# Patient Record
Sex: Male | Born: 1945
Health system: Southern US, Community
[De-identification: ages and names within clinical notes are randomized; demographics above are authoritative.]

## PROBLEM LIST (undated history)

## (undated) DIAGNOSIS — Z950 Presence of cardiac pacemaker: Secondary | ICD-10-CM

## (undated) DIAGNOSIS — I3139 Other pericardial effusion (noninflammatory): Secondary | ICD-10-CM

## (undated) DIAGNOSIS — I351 Nonrheumatic aortic (valve) insufficiency: Secondary | ICD-10-CM

## (undated) DIAGNOSIS — G4730 Sleep apnea, unspecified: Secondary | ICD-10-CM

## (undated) DIAGNOSIS — Z95810 Presence of automatic (implantable) cardiac defibrillator: Secondary | ICD-10-CM

## (undated) DIAGNOSIS — I313 Pericardial effusion (noninflammatory): Secondary | ICD-10-CM

## (undated) DIAGNOSIS — I420 Dilated cardiomyopathy: Secondary | ICD-10-CM

## (undated) DIAGNOSIS — I5020 Unspecified systolic (congestive) heart failure: Secondary | ICD-10-CM

## (undated) DIAGNOSIS — I071 Rheumatic tricuspid insufficiency: Secondary | ICD-10-CM

## (undated) DIAGNOSIS — I371 Nonrheumatic pulmonary valve insufficiency: Secondary | ICD-10-CM

## (undated) DIAGNOSIS — R5383 Other fatigue: Secondary | ICD-10-CM

## (undated) DIAGNOSIS — R0602 Shortness of breath: Secondary | ICD-10-CM

## (undated) DIAGNOSIS — I517 Cardiomegaly: Secondary | ICD-10-CM

## (undated) DIAGNOSIS — I447 Left bundle-branch block, unspecified: Secondary | ICD-10-CM

## (undated) DIAGNOSIS — I340 Nonrheumatic mitral (valve) insufficiency: Secondary | ICD-10-CM

## (undated) DIAGNOSIS — I509 Heart failure, unspecified: Secondary | ICD-10-CM

## (undated) HISTORY — DX: Nonrheumatic aortic (valve) insufficiency: I35.1

## (undated) HISTORY — DX: Cardiomegaly: I51.7

## (undated) HISTORY — DX: Other fatigue: R53.83

## (undated) HISTORY — DX: Left bundle-branch block, unspecified: I44.7

## (undated) HISTORY — DX: Rheumatic tricuspid insufficiency: I07.1

## (undated) HISTORY — DX: Other pericardial effusion (noninflammatory): I31.39

## (undated) HISTORY — DX: Shortness of breath: R06.02

## (undated) HISTORY — DX: Dilated cardiomyopathy: I42.0

## (undated) HISTORY — DX: Pericardial effusion (noninflammatory): I31.3

## (undated) HISTORY — DX: Nonrheumatic mitral (valve) insufficiency: I34.0

## (undated) HISTORY — DX: Nonrheumatic pulmonary valve insufficiency: I37.1

---

## 1998-01-01 HISTORY — PX: LEG SURGERY: SHX1003

## 2000-02-25 ENCOUNTER — Encounter: Payer: Self-pay | Admitting: Orthopedic Surgery

## 2000-02-25 ENCOUNTER — Emergency Department (HOSPITAL_COMMUNITY): Admission: EM | Admit: 2000-02-25 | Discharge: 2000-02-25 | Payer: Self-pay | Admitting: Emergency Medicine

## 2000-02-28 ENCOUNTER — Ambulatory Visit (HOSPITAL_BASED_OUTPATIENT_CLINIC_OR_DEPARTMENT_OTHER): Admission: RE | Admit: 2000-02-28 | Discharge: 2000-02-28 | Payer: Self-pay | Admitting: Orthopedic Surgery

## 2000-03-30 ENCOUNTER — Emergency Department (HOSPITAL_COMMUNITY): Admission: EM | Admit: 2000-03-30 | Discharge: 2000-03-30 | Payer: Self-pay | Admitting: Emergency Medicine

## 2015-03-21 ENCOUNTER — Encounter (HOSPITAL_COMMUNITY): Payer: Self-pay | Admitting: *Deleted

## 2015-03-21 ENCOUNTER — Emergency Department (HOSPITAL_COMMUNITY)
Admission: EM | Admit: 2015-03-21 | Discharge: 2015-03-21 | Disposition: A | Discharge status: Home / Self Care | Payer: Self-pay | Attending: Emergency Medicine | Admitting: Emergency Medicine

## 2015-03-21 DIAGNOSIS — I5020 Unspecified systolic (congestive) heart failure: Secondary | ICD-10-CM | POA: Insufficient documentation

## 2015-03-21 DIAGNOSIS — K6289 Other specified diseases of anus and rectum: Secondary | ICD-10-CM | POA: Insufficient documentation

## 2015-03-21 HISTORY — DX: Unspecified systolic (congestive) heart failure: I50.20

## 2015-03-21 NOTE — ED Notes (Signed)
Pt states this morning he noticed a bulge coming out of his rectum. States he was unsure if his rectum prolapsed or if he has hemorrhoids. Denies history of either in the past.

## 2015-03-21 NOTE — ED Notes (Signed)
Pt's stated that he didn't want to wait any longer Informed pt that due to his acuity he was next to go back. Pt asked if he was going to see and MD or a PA I told the pt i was unsure of that. Pt stated that he didn't want to wait any longer.

## 2016-04-03 DIAGNOSIS — I420 Dilated cardiomyopathy: Secondary | ICD-10-CM | POA: Diagnosis not present

## 2016-04-03 DIAGNOSIS — I5020 Unspecified systolic (congestive) heart failure: Secondary | ICD-10-CM | POA: Diagnosis not present

## 2016-05-29 DIAGNOSIS — L255 Unspecified contact dermatitis due to plants, except food: Secondary | ICD-10-CM | POA: Diagnosis not present

## 2016-06-19 DIAGNOSIS — J029 Acute pharyngitis, unspecified: Secondary | ICD-10-CM | POA: Diagnosis not present

## 2016-08-07 DIAGNOSIS — I447 Left bundle-branch block, unspecified: Secondary | ICD-10-CM | POA: Diagnosis not present

## 2016-08-07 DIAGNOSIS — I5020 Unspecified systolic (congestive) heart failure: Secondary | ICD-10-CM | POA: Diagnosis not present

## 2016-12-31 DIAGNOSIS — I420 Dilated cardiomyopathy: Secondary | ICD-10-CM | POA: Diagnosis not present

## 2016-12-31 DIAGNOSIS — Z0189 Encounter for other specified special examinations: Secondary | ICD-10-CM | POA: Diagnosis not present

## 2016-12-31 DIAGNOSIS — I5020 Unspecified systolic (congestive) heart failure: Secondary | ICD-10-CM | POA: Diagnosis not present

## 2017-03-14 DIAGNOSIS — J069 Acute upper respiratory infection, unspecified: Secondary | ICD-10-CM | POA: Diagnosis not present

## 2017-03-14 DIAGNOSIS — B9789 Other viral agents as the cause of diseases classified elsewhere: Secondary | ICD-10-CM | POA: Diagnosis not present

## 2017-04-30 DIAGNOSIS — I5020 Unspecified systolic (congestive) heart failure: Secondary | ICD-10-CM | POA: Diagnosis not present

## 2017-04-30 DIAGNOSIS — Z0189 Encounter for other specified special examinations: Secondary | ICD-10-CM | POA: Diagnosis not present

## 2017-04-30 DIAGNOSIS — I420 Dilated cardiomyopathy: Secondary | ICD-10-CM | POA: Diagnosis not present

## 2017-05-28 DIAGNOSIS — I5020 Unspecified systolic (congestive) heart failure: Secondary | ICD-10-CM | POA: Diagnosis not present

## 2017-06-25 DIAGNOSIS — R0602 Shortness of breath: Secondary | ICD-10-CM | POA: Diagnosis not present

## 2017-08-08 DIAGNOSIS — I5020 Unspecified systolic (congestive) heart failure: Secondary | ICD-10-CM | POA: Diagnosis not present

## 2017-08-15 DIAGNOSIS — I5020 Unspecified systolic (congestive) heart failure: Secondary | ICD-10-CM | POA: Diagnosis not present

## 2017-09-23 DIAGNOSIS — I420 Dilated cardiomyopathy: Secondary | ICD-10-CM | POA: Diagnosis not present

## 2017-09-23 DIAGNOSIS — Z0189 Encounter for other specified special examinations: Secondary | ICD-10-CM | POA: Diagnosis not present

## 2017-09-23 DIAGNOSIS — I5020 Unspecified systolic (congestive) heart failure: Secondary | ICD-10-CM | POA: Diagnosis not present

## 2017-10-11 ENCOUNTER — Encounter: Payer: Self-pay | Admitting: Internal Medicine

## 2017-10-14 ENCOUNTER — Ambulatory Visit (INDEPENDENT_AMBULATORY_CARE_PROVIDER_SITE_OTHER): Payer: Medicare Other | Admitting: Internal Medicine

## 2017-10-14 ENCOUNTER — Encounter: Payer: Self-pay | Admitting: Internal Medicine

## 2017-10-14 VITALS — BP 120/82 | HR 63 | Ht 72.0 in | Wt 188.4 lb

## 2017-10-14 DIAGNOSIS — I428 Other cardiomyopathies: Secondary | ICD-10-CM

## 2017-10-14 DIAGNOSIS — R931 Abnormal findings on diagnostic imaging of heart and coronary circulation: Secondary | ICD-10-CM | POA: Diagnosis not present

## 2017-10-14 NOTE — Progress Notes (Addendum)
ELECTROPHYSIOLOGY CONSULT NOTE  Patient ID: DANIELLE LENTO, MRN: [id number], DOB/AGE: 1945/01/25 72 y.o. Admit date: (Not on file) Date of Consult: 10/14/2017  Primary Physician: Patient, No Pcp Per Primary Cardiologist: CV     Brooke C Bakos is a 72 y.o. male who is being seen today for the evaluation of ICD at the request of [place].    HPI TRENELL CONCANNON is a 72 y.o. male referred for consideration of an ICD  He has a nonischemic cardiomyopathy which was diagnosed 2016 when he presented with orthopnea and recumbent cough.  His EF was 11% with anterior wall thinning.  It was presumed nonischemic.  With introduction of medical therapy he has no functional limitations although he did notice himself short of breath 2 weeks ago at work having climb 4 flights of stairs.  He walks/runs 4 miles a day.  He has no dyspnea with his normal activities including running.  No peripheral edema no palpitations no syncope.  He was told he had PVCs in the mid 20s.  His brother has a "enlarged heart"  DATE TEST EF   8/16 MYOVIEW   11 %   PVCs noted  8/18 Echo   13 % LAE severe MR Mod  8/19 Echo  13%        Past Medical History:  Diagnosis Date  . Cardiomegaly   . DCM (dilated cardiomyopathy) ([place])   . Fatigue   . LBBB (left bundle branch block)   . Mild tricuspid regurgitation   . Moderate aortic regurgitation   . Moderate mitral regurgitation   . Pericardial effusion    INSIGNIFICANT  . Short of breath on exertion   . Systolic heart failure ([place])   . Trace pulmonic regurgitation by prior echocardiogram       Surgical History:  Past Surgical History:  Procedure Laterality Date  . LEG SURGERY Left 2000   DUE TO BROKE LEG     Home Meds: Prior to Admission medications   Medication Sig Start Date End Date Taking? Authorizing Provider  carvedilol (COREG) 3.125 MG tablet Take 3.125 mg by mouth 2 (two) times daily with a meal.   Yes [provider]  furosemide (LASIX) 20 MG tablet Take 20 mg by mouth every other day.   Yes [provider]  sacubitril-valsartan (ENTRESTO) 97-103 MG Take 0.5 tablets by mouth 2 (two) times daily.   Yes [provider]    Allergies:  Allergies  Allergen Reactions  . Aspirin     Stomach pain     Social History   Socioeconomic History  . Marital status: Divorced    Spouse name: Not on file  . Number of children: 3  . Years of education: Not on file  . Highest education level: Not on file  Occupational History  . Not on file  Social Needs  . Financial resource strain: Not on file  . Food insecurity:    Worry: Not on file    Inability: Not on file  . Transportation needs:    Medical: Not on file    Non-medical: Not on file  Tobacco Use  . Smoking status: Never Smoker  . Smokeless tobacco: Never Used  Substance and Sexual Activity  . Alcohol use: No  . Drug use: No  . Sexual activity: Not on file  Lifestyle  . Physical activity:    Days per week: Not on file    Minutes per session: Not on file  .  Stress: Not on file  Relationships  . Social connections:    Talks on phone: Not on file    Gets together: Not on file    Attends religious service: Not on file    Active member of club or organization: Not on file    Attends meetings of clubs or organizations: Not on file    Relationship status: Not on file  . Intimate partner violence:    Fear of current or ex partner: Not on file    Emotionally abused: Not on file    Physically abused: Not on file    Forced sexual activity: Not on file  Other Topics Concern  . Not on file  Social History Narrative  . Not on file     Family History  Problem Relation Age of Onset  . Hypertension Mother 48  . Thyroid disease Mother      ROS:  Please see the history of present illness.     All other systems reviewed and negative.    Physical Exam: Blood pressure 120/82, pulse 63, height 6' (1.829 m), weight 188 lb 6.4  oz (85.5 kg), SpO2 99 %. General: Well developed, well nourished male in no acute distress. Head: Normocephalic, atraumatic, sclera non-icteric, no xanthomas, nares are without discharge. EENT: normal  Lymph Nodes:  none Neck: Negative for carotid bruits. JVD not elevated. Back:without scoliosis kyphosis Lungs: Clear bilaterally to auscultation without wheezes, rales, or rhonchi. Breathing is unlabored. Heart: RRR with S1 S2.  2/6 systolic murmur . No rubs, or gallops appreciated.  PMI is displaced and dyskinetic Abdomen: Soft, non-tender, non-distended with normoactive bowel sounds. No hepatomegaly. No rebound/guarding. No obvious abdominal masses. Msk:  Strength and tone appear normal for age. Extremities: No clubbing or cyanosis. No edema.  Distal pedal pulses are 2+ and equal bilaterally. Skin: Warm and Dry Neuro: Alert and oriented X 3. CN III-XII intact Grossly normal sensory and motor function . Psych:  Responds to questions appropriately with a normal affect.      Labs: Cardiac Enzymes No results for input(s): CKTOTAL, CKMB, TROPONINI in the last 72 hours. CBC No results found for: WBC, HGB, HCT, MCV, PLT PROTIME: No results for input(s): LABPROT, INR in the last 72 hours. Chemistry No results for input(s): NA, K, CL, CO2, BUN, CREATININE, CALCIUM, PROT, BILITOT, ALKPHOS, ALT, AST, GLUCOSE in the last 168 hours.  Invalid input(s): LABALBU Lipids No results found for: CHOL, HDL, LDLCALC, TRIG BNP No results found for: PROBNP Thyroid Function Tests: No results for input(s): TSH, T4TOTAL, T3FREE, THYROIDAB in the last 72 hours.  Invalid input(s): FREET3 Miscellaneous No results found for: DDIMER  Radiology/Studies:  No results found.  EKG: Sinus rhythm at 63 17/14/47 Axis left -25 IVCD PVCs-frequent  Rhythm strip demonstrates ventricular couplets and ventricular ectopy frequency   Assessment and Plan:  Cardiomyopathy-presumed nonischemic  Congestive heart  failure-class I-II  Ventricular ectopy-frequent and complex  IVCD-not left bundle  Hypokalemia with Entresto-high dose   His functional status i.e. estimated class I with running 4 miles daily, excludes him from current criteria for nonischemic cardiomyopathy ICD implantation for primary prevention.  Moreover, based on the [place] trial, ICD benefit would expected to be considerably less in this cohort than a younger cohort.  Hence, I think ICD implantation is not indicated.  His tendency towards hyperkalemia makes the use of spironolactone potentially treacherous.  Up titration of his carvedilol has also been limited by bradycardia.  His family history is intriguing.  He is  1 of 17 children.  His next closest brother also has "enlarged heart".  There are at least 2 deaths that occurred as accidents, one falling off the porch and the other in a car.  This raises the possibility of a familial cardiomyopathy.  His origins in [place] raise the question of [place] disease.  I discussed this with Dr. CV.  He will undertake cardiac MRI to try to elucidate mechanisms including the possibility of ischemic heart disease which might benefit from revascularization  The patient will be talking with his sister, a physician in [place], about the family history.  I discussed with the patient and his son the potential implications of genetic disease  He also has a history of long-standing ventricular ectopy.  On tracings today, PVCs constituted about 25% of his beats raising the possibility as to whether they are contributing to his cardiomyopathy, even if the primary mechanism of his cardiomyopathy is separate.  His PVCs are long-standing.  A recent paper highlights the long-term trajectory of people with long-standing PVCs as including cardiomyopathy.  In the event that his MRI shows significant scarring, studies have suggested that PVC obliteration either with drugs or ablation is not likely to be associated LV  function improvement  More than 50% of 85 min was spent in counseling related to the above   Virl Axe

## 2017-10-14 NOTE — Patient Instructions (Signed)
Medication Instructions:   Your physician recommends that you continue on your current medications as directed. Please refer to the Current Medication list given to you today.  If you need a refill on your cardiac medications before your next appointment, please call your pharmacy.   Lab work: None ordered. If you have labs (blood work) drawn today and your tests are completely normal, you will receive your results only by: Marland Kitchen MyChart Message (if you have MyChart) OR . A paper copy in the mail If you have any lab test that is abnormal or we need to change your treatment, we will call you to review the results.  Testing/Procedures: None ordered.  Follow-Up: At [facility], you and your health needs are our priority.   You may see Dr Caryl Comes as needed.  Any Other Special Instructions Will Be Listed Below (If Applicable).

## 2017-11-06 ENCOUNTER — Other Ambulatory Visit: Payer: Self-pay | Admitting: *Deleted

## 2017-11-06 DIAGNOSIS — I509 Heart failure, unspecified: Secondary | ICD-10-CM

## 2017-11-06 DIAGNOSIS — I420 Dilated cardiomyopathy: Secondary | ICD-10-CM

## 2017-11-08 ENCOUNTER — Encounter: Payer: Self-pay | Admitting: *Deleted

## 2017-11-08 ENCOUNTER — Telehealth: Payer: Self-pay | Admitting: *Deleted

## 2017-11-08 NOTE — Telephone Encounter (Signed)
Left message regarding Cardiac MRI ordered by DrMarland Kitchen Einar Gip.   Wednesday 11/20/17 at 8:00am----will mail instructions and appointment information to patient.  Also notified Dr. Irven Shelling office.

## 2017-11-20 ENCOUNTER — Other Ambulatory Visit: Payer: Self-pay | Admitting: *Deleted

## 2017-11-20 ENCOUNTER — Other Ambulatory Visit: Payer: Self-pay | Admitting: Cardiology

## 2017-11-20 ENCOUNTER — Ambulatory Visit (HOSPITAL_COMMUNITY)
Admission: RE | Admit: 2017-11-20 | Discharge: 2017-11-20 | Disposition: A | Discharge status: Home / Self Care | Payer: Medicare Other | Source: Ambulatory Visit | Attending: Cardiology | Admitting: Cardiology

## 2017-11-20 DIAGNOSIS — I420 Dilated cardiomyopathy: Secondary | ICD-10-CM | POA: Diagnosis not present

## 2017-11-20 DIAGNOSIS — I509 Heart failure, unspecified: Secondary | ICD-10-CM

## 2017-11-20 DIAGNOSIS — I083 Combined rheumatic disorders of mitral, aortic and tricuspid valves: Secondary | ICD-10-CM | POA: Insufficient documentation

## 2017-11-20 DIAGNOSIS — I313 Pericardial effusion (noninflammatory): Secondary | ICD-10-CM | POA: Insufficient documentation

## 2017-11-20 LAB — CREATININE, SERUM
Creatinine, Ser: 1.27 mg/dL — ABNORMAL HIGH (ref 0.61–1.24)
GFR calc Af Amer: >60 mL/min (ref >60)
GFR calc non Af Amer: 55 mL/min — ABNORMAL LOW (ref >60)

## 2017-11-20 MED ORDER — GADOBUTROL 1 MMOL/ML IV SOLN
8.5000 mL | Freq: Once | INTRAVENOUS | Status: AC | PRN
  Administered 2017-11-20: 8.5 mL via INTRAVENOUS

## 2018-01-25 DIAGNOSIS — I509 Heart failure, unspecified: Secondary | ICD-10-CM | POA: Diagnosis not present

## 2018-01-25 DIAGNOSIS — R5383 Other fatigue: Secondary | ICD-10-CM | POA: Diagnosis not present

## 2018-01-25 DIAGNOSIS — R0609 Other forms of dyspnea: Secondary | ICD-10-CM | POA: Diagnosis not present

## 2018-01-25 DIAGNOSIS — I420 Dilated cardiomyopathy: Secondary | ICD-10-CM | POA: Diagnosis not present

## 2018-01-27 DIAGNOSIS — I447 Left bundle-branch block, unspecified: Secondary | ICD-10-CM | POA: Diagnosis not present

## 2018-01-30 DIAGNOSIS — I5041 Acute combined systolic (congestive) and diastolic (congestive) heart failure: Secondary | ICD-10-CM | POA: Diagnosis not present

## 2018-01-30 DIAGNOSIS — Z0189 Encounter for other specified special examinations: Secondary | ICD-10-CM | POA: Diagnosis not present

## 2018-01-30 DIAGNOSIS — I428 Other cardiomyopathies: Secondary | ICD-10-CM | POA: Diagnosis not present

## 2018-01-30 DIAGNOSIS — I420 Dilated cardiomyopathy: Secondary | ICD-10-CM | POA: Diagnosis not present

## 2018-01-31 ENCOUNTER — Ambulatory Visit (INDEPENDENT_AMBULATORY_CARE_PROVIDER_SITE_OTHER): Payer: Medicare Other | Admitting: Internal Medicine

## 2018-01-31 ENCOUNTER — Encounter: Payer: Self-pay | Admitting: Internal Medicine

## 2018-01-31 VITALS — BP 112/76 | HR 64 | Ht 72.0 in | Wt 194.2 lb

## 2018-01-31 DIAGNOSIS — I509 Heart failure, unspecified: Secondary | ICD-10-CM | POA: Diagnosis not present

## 2018-01-31 DIAGNOSIS — I420 Dilated cardiomyopathy: Secondary | ICD-10-CM | POA: Diagnosis not present

## 2018-01-31 DIAGNOSIS — I428 Other cardiomyopathies: Secondary | ICD-10-CM | POA: Diagnosis not present

## 2018-01-31 DIAGNOSIS — R931 Abnormal findings on diagnostic imaging of heart and coronary circulation: Secondary | ICD-10-CM

## 2018-01-31 NOTE — Progress Notes (Signed)
Patient Care Team: [name], MD as PCP - General (Family Medicine)   HPI  Miguel Patterson is a 73 y.o. male He has a nonischemic cardiomyopathy which was diagnosed 2016 when he presented with orthopnea and recumbent cough.  His EF was 11% with anterior wall thinning.  It was presumed nonischemic.  With introduction of medical therapy he has no functional limitations although he did notice himself short of breath 2 weeks ago at work having climb 4 flights of stairs.  He walks/runs 4 miles a day.Marland Kitchen  He was told he had PVCs in the mid 20s.  His brother has a "enlarged heart"  DATE TEST EF   8/16 MYOVIEW   11 %   PVCs noted  8/18 Echo   13 % LAE severe MR Mod  8/19 Echo  13%   cMRI cMRI 14%  gadolinium enhancement basal septal non-compacted/compacted ratio 4: 1   He has developed intercurrently heart failure and has been treated with increased Lasix with some improved diuresis.   Records and Results Reviewed   Past Medical History:  Diagnosis Date  . Cardiomegaly   . DCM (dilated cardiomyopathy) ([place])   . Fatigue   . LBBB (left bundle branch block)   . Mild tricuspid regurgitation   . Moderate aortic regurgitation   . Moderate mitral regurgitation   . Pericardial effusion    INSIGNIFICANT  . Short of breath on exertion   . Systolic heart failure ([place])   . Trace pulmonic regurgitation by prior echocardiogram     Past Surgical History:  Procedure Laterality Date  . LEG SURGERY Left 2000   DUE TO BROKE LEG    Current Meds  Medication Sig  . carvedilol (COREG) 3.125 MG tablet Take 3.125 mg by mouth 2 (two) times daily with a meal.  . furosemide (LASIX) 20 MG tablet Take 20 mg by mouth 2 (two) times daily.   . sacubitril-valsartan (ENTRESTO) 97-103 MG Take 0.5 tablets by mouth 2 (two) times daily.    Allergies  Allergen Reactions  . Aspirin     Stomach pain       Review of Systems negative except from HPI and PMH  Physical Exam BP  112/76   Pulse 64   Ht 6' (1.829 m)   Wt 194 lb 3.2 oz (88.1 kg)   SpO2 97%   BMI 26.34 kg/m  Well developed and well nourished in no acute distress HENT normal E scleral and icterus clear Neck Supple JVP 8-10; carotids brisk and full Clear to ausculation Regular rate and rhythm, no murmurs gallops or rub Soft with active bowel sounds No clubbing cyanosis  Edema Alert and oriented, grossly normal motor and sensory function Skin Warm and Dry  ECG from yesterday demonstrates sinus rhythm with left bundle branch block  Assessment and  Plan Cardiomyopathy-presumed nonischemic possibly non-compaction  Congestive heart failure-class 2-3 Acute chronic  Ventricular ectopy-frequent and complex  Left bundle branch block  Hypokalemia with Entresto-high dose   The patient has developed acute on chronic congestive heart failure.  He remains volume overloaded.  He is being diuresed by his primary cardiology service.  He is referred back for consideration of an ICD in the context of his heart failure.  We had a lengthy discussion regarding modes of dying.  He is initially inclined towards no ICD as he would prefer sudden death in the present to some years of intervening life with alternative modes of dying at that  time.  I mentioned the fact that we live in the context of community and family.  He will review his decision with his children and get back to [place].  With his LBBB he may benefit from CRT   I will also review on his behalf the issue of non-compaction and familial evaluation  I wonder also whether he would benefit from spironolactone and will discuss this with Dr. Helyn Numbers  More than 50% of 40 min was spent in counseling related to the above     Current medicines are reviewed at length with the patient today .  The patient does not  have concerns regarding medicines.

## 2018-01-31 NOTE — Patient Instructions (Signed)
Medication Instructions:  Your physician recommends that you continue on your current medications as directed. Please refer to the Current Medication list given to you today.  Labwork: None ordered.  Testing/Procedures: None ordered.  Follow-Up: Your physician recommends that you schedule a follow-up as needed with Dr Caryl Comes.   Please call us when you would like to schedule your implant.   Any Other Special Instructions Will Be Listed Below (If Applicable).     If you need a refill on your cardiac medications before your next appointment, please call your pharmacy.

## 2018-02-06 ENCOUNTER — Other Ambulatory Visit: Payer: Self-pay | Admitting: Cardiology

## 2018-02-06 DIAGNOSIS — I428 Other cardiomyopathies: Secondary | ICD-10-CM | POA: Diagnosis not present

## 2018-02-07 LAB — BASIC METABOLIC PANEL
BUN / CREAT RATIO: 18 (ref 10–24)
BUN: 27 mg/dL (ref 8–27)
CHLORIDE: 104 mmol/L (ref 96–106)
CO2: 25 mmol/L (ref 20–29)
Calcium: 9.3 mg/dL (ref 8.6–10.2)
Creatinine, Ser: 1.52 mg/dL — ABNORMAL HIGH (ref 0.76–1.27)
GFR calc non Af Amer: 45 mL/min/{1.73_m2} — ABNORMAL LOW (ref >59)
GFR, EST AFRICAN AMERICAN: 52 mL/min/{1.73_m2} — AB (ref >59)
GLUCOSE: 104 mg/dL — AB (ref 65–99)
Potassium: 4.6 mmol/L (ref 3.5–5.2)
Sodium: 144 mmol/L (ref 134–144)

## 2018-02-07 LAB — BRAIN NATRIURETIC PEPTIDE: BNP: 1073.6 pg/mL — AB (ref 0.0–100.0)

## 2018-02-07 NOTE — Patient Instructions (Signed)
Heart Failure Exacerbation  Heart failure is a condition in which the heart does not fill up with enough blood, and therefore does not pump enough blood and oxygen to the body. When this happens, parts of the body do not get the blood and oxygen they need to function properly. This can cause symptoms such as breathing problems, fatigue, swelling, and confusion. Heart failure exacerbation refers to heart failure symptoms that get worse. The symptoms may get worse suddenly or develop slowly over time. Heart failure exacerbation is a serious medical problem that should be treated right away. What are the causes? A heart failure exacerbation can be triggered by:  Not taking your heart failure medicines correctly.  Infections.  Eating an unhealthy diet or a diet that is high in salt (sodium).  Drinking too much fluid.  Drinking alcohol.  Taking illegal drugs, such as cocaine or methamphetamine.  Not exercising. Other causes include:  Other heart conditions such as an irregular heartbeat (arrhythmia).  Anemia.  Other medical problems, such as kidney failure. Sometimes the cause of the exacerbation is not known. What are the signs or symptoms? When heart failure symptoms suddenly or slowly get worse, this may be a sign of heart failure exacerbation. Symptoms of heart failure include:  Breathing problems or shortness of breath.  Chronic coughing or wheezing.  Fatigue.  Nausea or lack of appetite.  Feeling light-headed.  Confusion or memory loss.  Increased heart rate or irregular heartbeat.  Buildup of fluid in the legs, ankles, feet, or abdomen.  Difficulty breathing when lying down. How is this diagnosed? This condition is diagnosed based on:  Your symptoms and medical history.  A physical exam. You may also have tests, including:  Electrocardiogram (ECG). This test measures the electrical activity of your heart.  Echocardiogram. This test uses sound waves to take a  picture of your heart to see how well it works.  Blood tests.  Imaging tests, such as: ? Chest X-ray. ? MRI. ? Ultrasound.  Stress test. This test examines how well your heart functions when you exercise. Your heart is monitored while you exercise on a treadmill or exercise bike. If you cannot exercise, medicines may be used to increase your heartbeat in place of exercise.  Cardiac catheterization. During this test, a thin, flexible tube (catheter) is inserted into a blood vessel and threaded up to your heart. This test allows your health care provider to check the arteries that lead to your heart (coronary arteries).  Right heart catheterization. During this test, the pressure in your heart is measured. How is this treated? This condition may be treated by:  Adjusting your heart medicines.  Maintaining a healthy lifestyle. This includes: ? Eating a heart-healthy diet that is low in sodium. ? Not using any products that contain nicotine or tobacco, such as cigarettes and e-cigarettes. ? Regular exercise. ? Monitoring your fluid intake. ? Monitoring your weight and reporting changes to your health care provider.  Treating sleep apnea, if you have this condition.  Surgery. This may include: ? Implanting a device that helps both sides of your heart contract at the same time (cardiac resynchronization therapy device). This can help with heart function and relieve heart failure symptoms. ? Implanting a device that can correct heart rhythm problems (implantable cardioverter defibrillator). ? Connecting a device to your heart to help it pump blood (ventricular assist device). ? Heart transplant. Follow these instructions at home: Medicines  Take over-the-counter and prescription medicines only as told by your  health care provider.  Do not stop taking your medicines or change the amount you take. If you are having problems or side effects from your medicines, talk to your health care  provider.  If you are having difficulty paying for your medicines, contact a social worker or your clinic. There are many programs to assist with medicine costs.  Talk to your health care provider before starting any new medicines or supplements.  Make sure your health care provider and pharmacist have a list of all the medicines you are taking. Eating and drinking   Avoid drinking alcohol.  Eat a heart-healthy diet as told by your health care provider. This includes: ? Plenty of fruits and vegetables. ? Lean proteins. ? Low-fat dairy. ? Whole grains. ? Foods that are low in sodium. Activity   Exercise regularly as told by your health care provider. Balance exercise with rest.  Ask your health care provider what activities are safe for you. This includes sexual activity, exercise, and daily tasks at home or work. Lifestyle  Do not use any products that contain nicotine or tobacco, such as cigarettes and e-cigarettes. If you need help quitting, ask your health care provider.  Maintain a healthy weight. Ask your health care provider what weight is healthy for you.  Consider joining a patient support group. This can help with emotional problems you may have, such as stress and anxiety. General instructions  Talk to your health care provider about flu and pneumonia vaccines.  Keep a list of medicines that you are taking. This may help in emergency situations.  Keep all follow-up visits as told by your health care provider. This is important. Contact a health care provider if:  You have questions about your medicines or you miss a dose.  You feel anxious, depressed, or stressed.  You have swelling in your feet, ankles, legs, or abdomen.  You have shortness of breath during activity or exercise.  You have a cough.  You have a fever.  You have trouble sleeping.  You gain 2-3 lb (1-1.4 kg) in 24 hours or 5 lb (2.3 kg) in a week. Get help right away if:  You have chest  pain.  You have shortness of breath while resting.  You have severe fatigue.  You are confused.  You have severe dizziness.  You have a rapid or irregular heartbeat.  You have nausea or you vomit.  You have a cough that is worse at night or you cannot lie flat.  You have a cough that will not go away.  You have severe depression or sadness. Summary  When heart failure symptoms get worse, it is called heart failure exacerbation.  Common causes of this condition include taking medicines incorrectly, infections, and drinking alcohol.  This condition may be treated by adjusting medicines, maintaining a healthy lifestyle, or surgery.  Do not stop taking your medicines or change the amount you take. If you are having problems or side effects from your medicines, talk to your health care provider. This information is not intended to replace advice given to you by your health care provider. Make sure you discuss any questions you have with your health care provider. Document Released: 05/01/2016 Document Revised: 05/01/2016 Document Reviewed: 05/01/2016 Elsevier Interactive Patient Education  2019 [employer].

## 2018-02-07 NOTE — Progress Notes (Signed)
Subjective:   @Patient  ID@: Miguel Patterson, male    DOB: Sep 03, 1945, 73 y.o.   MRN: [id number]  [name], MD:  Chief Complaint  Patient presents with  . Follow-up    10 day  . Congestive Heart Failure    labs    HPI    Mr. Sens is 74 years old male from [place]. He has severe non ischemic dilated cardiomyopathy with severely depressed left-ventricular systolic function, Ejection fraction has been in teens for past 3 years in spite of optimal medical therapy. 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  Patient is on intermediate dose of Entresto. He had hyperkalemia with the highest dose of Entresto and thus it was decreased. He has renal insufficiency. He is also taking low dose carvedilol (Had severe bradycardia with intermediate dose) and furosemide. He is not on spironolactone because of renal insufficiency and hyperkalemia.  Patient was last seen on 01/31 for acute heart failure exacerbation with significant leg edema, dyspnea on exertion, and weight gain. Lasix was increased to 20 mg daily. He now presents for follow up.   He was referred back to Dr. Caryl Comes given heart failure exacerbation for ICD implantation. Patient wishes to discuss with family before pursuing this and has not yet made a decision.  He reports that symptoms have improved since last seen by [place] and feels that he is essentially back to his baseline. He does continue to have leg swelling, but improved.   No history of hypertension, diabetes or hypercholesterolemia. He does not smoke.  No history of MI. No history of thyroid problems. No history of TIA or CVA. Patient is from [place], he is not aware about having Chaga's disease at any time. There is concern for possible familial cardiomyopathy as one of his brothers also has an enlarged heart. He is professor at  [employer], teaches part-time.  Past Medical History:  Diagnosis Date  . Cardiomegaly   . DCM (dilated cardiomyopathy) ([place])   . Fatigue   . LBBB (left bundle branch block)   . Mild tricuspid regurgitation   . Moderate aortic regurgitation   . Moderate mitral regurgitation   . Pericardial effusion    INSIGNIFICANT  . Short of breath on exertion   . Systolic heart failure ([place])   . Trace pulmonic regurgitation by prior echocardiogram     Past Surgical History:  Procedure Laterality Date  . LEG SURGERY Left 2000   DUE TO BROKE LEG    Family History  Problem Relation Age of Onset  . Hypertension Mother 10  . Thyroid disease Mother     Social History   Socioeconomic History  . Marital status: Divorced    Spouse name: Not on file  . Number of children: 3  . Years of education: Not on file  . Highest education level: Not on file  Occupational History  . Not on file  Social Needs  . Financial resource strain: Not on file  . Food insecurity:    Worry: Not on file    Inability: Not on file  . Transportation needs:    Medical: Not on file    Non-medical: Not on file  Tobacco Use  . Smoking status: Never Smoker  . Smokeless tobacco: Never Used  Substance and Sexual Activity  . Alcohol use: No  . Drug use: No  . Sexual activity: Not  on file  Lifestyle  . Physical activity:    Days per week: Not on file    Minutes per session: Not on file  . Stress: Not on file  Relationships  . Social connections:    Talks on phone: Not on file    Gets together: Not on file    Attends religious service: Not on file    Active member of club or organization: Not on file    Attends meetings of clubs or organizations: Not on file    Relationship status: Not on file  . Intimate partner violence:    Fear of current or ex partner: Not on file    Emotionally abused: Not on file    Physically abused: Not on file    Forced sexual activity: Not on file  Other Topics Concern  . Not on file   Social History Narrative  . Not on file    Current Outpatient Medications on File Prior to Visit  Medication Sig Dispense Refill  . sacubitril-valsartan (ENTRESTO) 97-103 MG Take 0.5 tablets by mouth 2 (two) times daily.     No current facility-administered medications on file prior to visit.      Review of Systems  Constitution: Positive for weight loss (with lasix). Negative for decreased appetite, malaise/fatigue and weight gain.  Eyes: Negative for visual disturbance.  Cardiovascular: Positive for leg swelling (improved slightly). Negative for chest pain and dyspnea on exertion.  Respiratory: Negative for hemoptysis and wheezing.   Endocrine: Negative for cold intolerance and heat intolerance.  Skin: Negative for nail changes.  Musculoskeletal: Negative for myalgias.  Gastrointestinal: Negative for abdominal pain, nausea and vomiting.  Neurological: Negative for difficulty with concentration, dizziness, focal weakness and headaches.  Psychiatric/Behavioral: Negative for altered mental status and suicidal ideas.  All other systems reviewed and are negative.  Cardiac studies Echocardiogram 08/15/2017: Left ventricle cavity is severely dilated. Severe decrease in global wall motion. Doppler evidence of grade I (impaired) diastolic dysfunction, normal LAP. Left ventricle regional wall motion findings: No wall motion abnormalities. Calculated EF 13%. Left atrial cavity is severely dilated. Moderate (Grade II) aortic regurgitation. Moderate (Grade II) mitral regurgitation. Mild tricuspid regurgitation. Mild pulmonary hypertension. Estimated pulmonary artery systolic pressure 30 mmHg. Trace pulmonic regurgitation. Insignificant pericardial effusion. IVC is dilated with respiratory variation. Estimated RA pressure 8 mmHg. Compared to previous study on 08/07/2016, pericardial effusion is new. PASP is relatively lower.  Exercise sestamibi stress test 08/27/2014: 1. The resting  electrocardiogram demonstrated normal sinus rhythm, incomplete LBBB and no resting arrhythmias.  ST segment depression and T-wave inversion in inferior and lateral leads. Stress EKG is negative for ischemia. The patient performed treadmill exercise using a Bruce protocol, completing 6:26 minutes. Thre were occasional PVCs. The patient completed an estimated workload of 7.65 METS, 95% of the maximum predicted heart rate. The stress test was terminated because of dyspnea. 2. The LV is markedly dilated both at rest and stress images. The LV end diastolic volume was 619JK. Thre is anterior wall thinning. There is no evidence of ischemia. Thre is severe generalized hypokinesis and LVEF markedly depressed at 11%.  Findings are consistent with dilated cardiomyopathy.  Clinical correlation is recommended.  Cardiac MRI 11/20/2017:  1. Severely dilated left ventricle with normal wall thickness and severely decreased systolic function (LVEF = 14%). There is severe diffuse hypokinesis and paradoxical septal motion consistent with LBBB. The ratio of non-compacted to compacted myocardium in the apical and mid portions of the ventricle is > 4:1. There  is midwall late gadolinium enhancement in the basal anteroseptal and inferoseptal myocardium.   2. Normal right ventricular size, thickness and systolic function (LVEF = 55%). There are no regional wall motion abnormalities.   3. Severely dilated left atrium (59 mm), normal right atrial size.   4. Moderately dilated pulmonary artery measuring 36 mm.   5. Moderate aortic and mitral regurgitation, mild tricuspid regurgitation.   6. Mild pericardial effusion.   Collectively, these findings are consistent with end stage non-compaction cardiomyopathy.  Labs 01/31/2018: Creatinine 1.28, eGFR 56, Potassium 5.4, BMP otherwise normal. BNP 1833.5.   Labs 02/06/2018: Creatinine 1.2, EGFR 45, potassium 4.6, BNP 1073.6     Objective:    Blood pressure 109/62,  pulse (!) 52, height 6' (1.829 m), weight 188 lb 1.6 oz (85.3 kg), SpO2 96 %.  Physical Exam  Constitutional: He is oriented to person, place, and time. Vital signs are normal. He appears well-developed and well-nourished.  HENT:  Head: Normocephalic and atraumatic.  Neck: Normal range of motion. No JVD present.  Cardiovascular: Normal rate, regular rhythm and intact distal pulses. Exam reveals no gallop.  Murmur heard.  Early systolic murmur is present with a grade of 1/6 at the upper right sternal border. Pulmonary/Chest: Effort normal and breath sounds normal. No accessory muscle usage. No respiratory distress.  Abdominal: Soft. Bowel sounds are normal.  Musculoskeletal: Normal range of motion.        General: Edema present.  Neurological: He is alert and oriented to person, place, and time.  Skin: Skin is warm, dry and intact.  Vitals reviewed.          Assessment & Recommendations:   1. Acute on chronic systolic and diastolic heart failure, NYHA class 2 ([place]) Symptoms have improved with increased dose of lasix. He now essentially feels as though he is back to baseline. BNP improved from 1800 to 1000. I will decrease his does of Lasix back to once a day, but encouraged him to take twice a day if needed for 2-3 lb weight gain. Continue with Carvedilol and Entresto. Has not been able to further uptitrate due hyperkalemia, renal insufficiency, and also marked bradycardia. Aldactone could be beneficial; however, hyperkalemia is an issue. Could consider Lokelma to help with this. I have reviewed note from Dr. Caryl Comes and appreciate his input. Discussed indications for ICD implantation along with pacemaker again with the patient, he continues to be unsure if he wishes to have this placed and wants to continue to discuss with his children and also do his own research before making a decision. He will keep [place] informed.  2. Left ventricular non-compaction cardiomyopathy ([facility]) LVEF of 14% by  cardiac MRI with findings suggestive of end stage non-compaction cardiomyopathy.   3. Left bundle branch block Has underlying LBBB and agree that he would benefit from CRT. Discussed improvement in symptoms and quality of life with this. He wishes again to research this and will further discuss with [place] in the next few weeks.   I will have him come back to see [place] in 4 weeks for close monitoring of CHF and also further discussion of pacemaker/ICD.    *I have discussed this case with Dr. Einar Gip and he personally examined the patient and participated in formulating the plan.*   Jeri Lager, [facility], [place] Office: [phone] Fax: [phone]

## 2018-02-10 ENCOUNTER — Ambulatory Visit (INDEPENDENT_AMBULATORY_CARE_PROVIDER_SITE_OTHER): Payer: Medicare Other | Admitting: Cardiology

## 2018-02-10 ENCOUNTER — Encounter: Payer: Self-pay | Admitting: Cardiology

## 2018-02-10 VITALS — BP 109/62 | HR 52 | Ht 72.0 in | Wt 188.1 lb

## 2018-02-10 DIAGNOSIS — I5042 Chronic combined systolic (congestive) and diastolic (congestive) heart failure: Secondary | ICD-10-CM

## 2018-02-10 DIAGNOSIS — I447 Left bundle-branch block, unspecified: Secondary | ICD-10-CM | POA: Insufficient documentation

## 2018-02-10 DIAGNOSIS — I5043 Acute on chronic combined systolic (congestive) and diastolic (congestive) heart failure: Secondary | ICD-10-CM

## 2018-02-10 DIAGNOSIS — I428 Other cardiomyopathies: Secondary | ICD-10-CM | POA: Diagnosis not present

## 2018-02-10 HISTORY — DX: Chronic combined systolic (congestive) and diastolic (congestive) heart failure: I50.42

## 2018-02-10 MED ORDER — FUROSEMIDE 20 MG PO TABS: 20.0000 mg | ORAL_TABLET | Freq: Every day | ORAL | 1 refills | Status: DC

## 2018-02-10 MED ORDER — CARVEDILOL 3.125 MG PO TABS: 3.1250 mg | ORAL_TABLET | Freq: Two times a day (BID) | ORAL | 1 refills | Status: DC

## 2018-02-11 ENCOUNTER — Telehealth: Payer: Self-pay | Admitting: Internal Medicine

## 2018-02-11 NOTE — Telephone Encounter (Signed)
Spoke with pt's daughter today. Unfortunately, pt has not filled out a DPR for [facility]. I have set up a follow up appt for pt and his daughter for 2/18. Pt's daughter will be present and would like to discuss this with Dr Caryl Comes as well. If pt comes by the office to fill out DPR, I will call pt's daughter back to discuss his last OV further.

## 2018-02-11 NOTE — Telephone Encounter (Signed)
New Message:   Patient daughter calling about her father. Please call patient daughter.

## 2018-02-11 NOTE — Telephone Encounter (Signed)
Pt left a message on my VM to talk to [place], pls call [phone]

## 2018-02-12 DIAGNOSIS — I428 Other cardiomyopathies: Secondary | ICD-10-CM

## 2018-02-12 NOTE — Telephone Encounter (Signed)
LVM for return call. 

## 2018-02-12 NOTE — Telephone Encounter (Signed)
Miguel Patterson called back is would like to schedule his ICD implant for 2/28. He will also come by the office to fill out DPR so Dr Caryl Comes can call his daughter to discuss POC. I will call pt back with pre procedure instructions.

## 2018-02-18 ENCOUNTER — Ambulatory Visit (INDEPENDENT_AMBULATORY_CARE_PROVIDER_SITE_OTHER): Payer: Medicare Other | Admitting: Internal Medicine

## 2018-02-18 ENCOUNTER — Ambulatory Visit: Payer: Medicare Other | Admitting: Internal Medicine

## 2018-02-18 ENCOUNTER — Telehealth: Payer: Self-pay | Admitting: Internal Medicine

## 2018-02-18 ENCOUNTER — Encounter: Payer: Self-pay | Admitting: Internal Medicine

## 2018-02-18 VITALS — BP 108/60 | HR 57 | Ht 72.0 in | Wt 184.0 lb

## 2018-02-18 DIAGNOSIS — I428 Other cardiomyopathies: Secondary | ICD-10-CM

## 2018-02-18 NOTE — Telephone Encounter (Signed)
LVM with pt to review his pre procedural instructions.

## 2018-02-18 NOTE — H&P (View-Only) (Signed)
Patient Care Team: [name], MD as PCP - General (Family Medicine)   HPI  Miguel Patterson is a 73 y.o. male He has a nonischemic cardiomyopathy which was diagnosed 2016 when he presented with orthopnea and recumbent cough.  His EF was 11% with anterior wall thinning.  It was presumed nonischemic.  With introduction of medical therapy he has no functional limitations although he did notice himself short of breath 2 weeks ago at work having climb 4 flights of stairs.  He walks/runs 4 miles a day.Marland Kitchen  He was told he had PVCs in the mid 20s.  His brother has a "enlarged heart"  DATE TEST EF   8/16 MYOVIEW   11 %   PVCs noted  8/18 Echo   13 % LAE severe MR Mod  8/19 Echo  13%   cMRI cMRI 14%  gadolinium enhancement basal septal non-compacted/compacted ratio 4: 1   Heart failure is much improved.  He is back to baseline without nocturnal dyspnea orthopnea.  He comes with his daughter so as to review with her the plans for device implantation   Records and Results Reviewed   Past Medical History:  Diagnosis Date  . Cardiomegaly   . DCM (dilated cardiomyopathy) ([place])   . Fatigue   . LBBB (left bundle branch block)   . Mild tricuspid regurgitation   . Moderate aortic regurgitation   . Moderate mitral regurgitation   . Pericardial effusion    INSIGNIFICANT  . Short of breath on exertion   . Systolic heart failure ([place])   . Trace pulmonic regurgitation by prior echocardiogram     Past Surgical History:  Procedure Laterality Date  . LEG SURGERY Left 2000   DUE TO BROKE LEG    Current Meds  Medication Sig  . carvedilol (COREG) 3.125 MG tablet Take 1 tablet (3.125 mg total) by mouth 2 (two) times daily with a meal.  . furosemide (LASIX) 20 MG tablet Take 1 tablet (20 mg total) by mouth daily.  . sacubitril-valsartan (ENTRESTO) 97-103 MG Take 0.5 tablets by mouth 2 (two) times daily.    Allergies  Allergen Reactions  . Aspirin     Stomach pain         Review of Systems negative except from HPI and PMH  Physical Exam BP 108/60   Pulse (!) 57   Ht 6' (1.829 m)   Wt 184 lb (83.5 kg)   SpO2 99%   BMI 24.95 kg/m  Well developed and nourished in no acute distress HENT normal Neck supple with JVP-flat Clear Regular rate and rhythm, no murmurs or gallops Abd-soft with active BS No Clubbing cyanosis edema Skin-warm and dry A & Oriented  Grossly normal sensory and motor function   ECG previously sinus rhythm with left bundle branch block  Assessment and  Plan Cardiomyopathy-presumed nonischemic possibly non-compaction  Congestive heart failure-class 2-3 Acute chronic  Ventricular ectopy-frequent and complex  Left bundle branch block  Hyperkalemia with Entresto-high dose  Have reviewed the potential benefits and risks of ICD implantation including but not limited to death, perforation of heart or lung, lead dislodgement, infection,  device malfunction and inappropriate shocks.  The patient and family express understanding  and are willing to proceed.     We spent more than 50% of our >25 min visit in face to face counseling regarding the above   Current medicines are reviewed at length with the patient today .  The patient does  not  have concerns regarding medicines.

## 2018-02-18 NOTE — Telephone Encounter (Signed)
No need for message. °

## 2018-02-18 NOTE — Progress Notes (Signed)
Patient Care Team: [name], MD as PCP - General (Family Medicine)   HPI  Miguel Patterson is a 73 y.o. male He has a nonischemic cardiomyopathy which was diagnosed 2016 when he presented with orthopnea and recumbent cough.  His EF was 11% with anterior wall thinning.  It was presumed nonischemic.  With introduction of medical therapy he has no functional limitations although he did notice himself short of breath 2 weeks ago at work having climb 4 flights of stairs.  He walks/runs 4 miles a day.Marland Kitchen  He was told he had PVCs in the mid 20s.  His brother has a "enlarged heart"  DATE TEST EF   8/16 MYOVIEW   11 %   PVCs noted  8/18 Echo   13 % LAE severe MR Mod  8/19 Echo  13%   cMRI cMRI 14%  gadolinium enhancement basal septal non-compacted/compacted ratio 4: 1   Heart failure is much improved.  He is back to baseline without nocturnal dyspnea orthopnea.  He comes with his daughter so as to review with her the plans for device implantation   Records and Results Reviewed   Past Medical History:  Diagnosis Date  . Cardiomegaly   . DCM (dilated cardiomyopathy) ([place])   . Fatigue   . LBBB (left bundle branch block)   . Mild tricuspid regurgitation   . Moderate aortic regurgitation   . Moderate mitral regurgitation   . Pericardial effusion    INSIGNIFICANT  . Short of breath on exertion   . Systolic heart failure ([place])   . Trace pulmonic regurgitation by prior echocardiogram     Past Surgical History:  Procedure Laterality Date  . LEG SURGERY Left 2000   DUE TO BROKE LEG    Current Meds  Medication Sig  . carvedilol (COREG) 3.125 MG tablet Take 1 tablet (3.125 mg total) by mouth 2 (two) times daily with a meal.  . furosemide (LASIX) 20 MG tablet Take 1 tablet (20 mg total) by mouth daily.  . sacubitril-valsartan (ENTRESTO) 97-103 MG Take 0.5 tablets by mouth 2 (two) times daily.    Allergies  Allergen Reactions  . Aspirin     Stomach pain         Review of Systems negative except from HPI and PMH  Physical Exam BP 108/60   Pulse (!) 57   Ht 6' (1.829 m)   Wt 184 lb (83.5 kg)   SpO2 99%   BMI 24.95 kg/m  Well developed and nourished in no acute distress HENT normal Neck supple with JVP-flat Clear Regular rate and rhythm, no murmurs or gallops Abd-soft with active BS No Clubbing cyanosis edema Skin-warm and dry A & Oriented  Grossly normal sensory and motor function   ECG previously sinus rhythm with left bundle branch block  Assessment and  Plan Cardiomyopathy-presumed nonischemic possibly non-compaction  Congestive heart failure-class 2-3 Acute chronic  Ventricular ectopy-frequent and complex  Left bundle branch block  Hyperkalemia with Entresto-high dose  Have reviewed the potential benefits and risks of ICD implantation including but not limited to death, perforation of heart or lung, lead dislodgement, infection,  device malfunction and inappropriate shocks.  The patient and family express understanding  and are willing to proceed.     We spent more than 50% of our >25 min visit in face to face counseling regarding the above   Current medicines are reviewed at length with the patient today .  The patient does  not  have concerns regarding medicines.

## 2018-02-25 ENCOUNTER — Other Ambulatory Visit: Payer: Medicare Other | Admitting: *Deleted

## 2018-02-25 DIAGNOSIS — I428 Other cardiomyopathies: Secondary | ICD-10-CM | POA: Diagnosis not present

## 2018-02-25 LAB — BASIC METABOLIC PANEL
BUN/Creatinine Ratio: 18 (ref 10–24)
BUN: 25 mg/dL (ref 8–27)
CALCIUM: 9.4 mg/dL (ref 8.6–10.2)
CHLORIDE: 106 mmol/L (ref 96–106)
CO2: 25 mmol/L (ref 20–29)
CREATININE: 1.38 mg/dL — AB (ref 0.76–1.27)
GFR, EST AFRICAN AMERICAN: 59 mL/min/{1.73_m2} — AB (ref >59)
GFR, EST NON AFRICAN AMERICAN: 51 mL/min/{1.73_m2} — AB (ref >59)
Glucose: 81 mg/dL (ref 65–99)
Potassium: 4.9 mmol/L (ref 3.5–5.2)
Sodium: 145 mmol/L — ABNORMAL HIGH (ref 134–144)

## 2018-02-25 LAB — CBC
HEMATOCRIT: 43.3 % (ref 37.5–51.0)
HEMOGLOBIN: 14.1 g/dL (ref 13.0–17.7)
MCH: 30.8 pg (ref 26.6–33.0)
MCHC: 32.6 g/dL (ref 31.5–35.7)
MCV: 95 fL (ref 79–97)
Platelets: 137 10*3/uL — ABNORMAL LOW (ref 150–450)
RBC: 4.58 x10E6/uL (ref 4.14–5.80)
RDW: 12.7 % (ref 11.6–15.4)
WBC: 5.2 10*3/uL (ref 3.4–10.8)

## 2018-02-28 ENCOUNTER — Ambulatory Visit (HOSPITAL_COMMUNITY)
Admission: RE | Admit: 2018-02-28 | Discharge: 2018-03-01 | Disposition: A | Discharge status: Home / Self Care | Payer: Medicare Other | Attending: Internal Medicine | Admitting: Internal Medicine

## 2018-02-28 ENCOUNTER — Encounter (HOSPITAL_COMMUNITY)
Admission: RE | Disposition: A | Discharge status: Home / Self Care | Payer: Self-pay | Source: Home / Self Care | Attending: Internal Medicine

## 2018-02-28 ENCOUNTER — Other Ambulatory Visit: Payer: Self-pay

## 2018-02-28 ENCOUNTER — Encounter (HOSPITAL_COMMUNITY): Payer: Self-pay | Admitting: Internal Medicine

## 2018-02-28 DIAGNOSIS — Z886 Allergy status to analgesic agent status: Secondary | ICD-10-CM | POA: Insufficient documentation

## 2018-02-28 DIAGNOSIS — Z950 Presence of cardiac pacemaker: Secondary | ICD-10-CM | POA: Diagnosis not present

## 2018-02-28 DIAGNOSIS — I5022 Chronic systolic (congestive) heart failure: Secondary | ICD-10-CM | POA: Diagnosis not present

## 2018-02-28 DIAGNOSIS — E875 Hyperkalemia: Secondary | ICD-10-CM | POA: Diagnosis not present

## 2018-02-28 DIAGNOSIS — Z006 Encounter for examination for normal comparison and control in clinical research program: Secondary | ICD-10-CM | POA: Diagnosis not present

## 2018-02-28 DIAGNOSIS — I428 Other cardiomyopathies: Secondary | ICD-10-CM | POA: Diagnosis not present

## 2018-02-28 DIAGNOSIS — I5020 Unspecified systolic (congestive) heart failure: Secondary | ICD-10-CM | POA: Diagnosis not present

## 2018-02-28 DIAGNOSIS — I447 Left bundle-branch block, unspecified: Secondary | ICD-10-CM | POA: Diagnosis not present

## 2018-02-28 DIAGNOSIS — Z79899 Other long term (current) drug therapy: Secondary | ICD-10-CM | POA: Diagnosis not present

## 2018-02-28 DIAGNOSIS — Z95810 Presence of automatic (implantable) cardiac defibrillator: Secondary | ICD-10-CM | POA: Diagnosis not present

## 2018-02-28 DIAGNOSIS — Z959 Presence of cardiac and vascular implant and graft, unspecified: Secondary | ICD-10-CM

## 2018-02-28 DIAGNOSIS — I420 Dilated cardiomyopathy: Secondary | ICD-10-CM | POA: Diagnosis not present

## 2018-02-28 HISTORY — DX: Presence of cardiac pacemaker: Z95.0

## 2018-02-28 HISTORY — PX: BIV ICD INSERTION CRT-D: EP1195

## 2018-02-28 LAB — SURGICAL PCR SCREEN
MRSA, PCR: NEGATIVE
STAPHYLOCOCCUS AUREUS: NEGATIVE

## 2018-02-28 SURGERY — BIV ICD INSERTION CRT-D

## 2018-02-28 MED ORDER — SODIUM CHLORIDE 0.9 % IV SOLN: INTRAVENOUS | Status: AC

## 2018-02-28 MED ORDER — IOPAMIDOL (ISOVUE-370) INJECTION 76%
INTRAVENOUS | Status: AC
  Filled 2018-02-28: qty 50

## 2018-02-28 MED ORDER — CARVEDILOL 3.125 MG PO TABS
3.1250 mg | ORAL_TABLET | Freq: Two times a day (BID) | ORAL | Status: DC
  Administered 2018-02-28 – 2018-03-01 (×2): 3.125 mg via ORAL
  Filled 2018-02-28 (×2): qty 1

## 2018-02-28 MED ORDER — MUPIROCIN 2 % EX OINT
TOPICAL_OINTMENT | Freq: Once | CUTANEOUS | Status: AC
  Administered 2018-02-28: 1 via NASAL
  Filled 2018-02-28 (×2): qty 22

## 2018-02-28 MED ORDER — IOPAMIDOL (ISOVUE-370) INJECTION 76%
INTRAVENOUS | Status: DC | PRN
  Administered 2018-02-28: 5 mL via INTRAVENOUS

## 2018-02-28 MED ORDER — CEFAZOLIN SODIUM-DEXTROSE 1-4 GM/50ML-% IV SOLN
1.0000 g | Freq: Four times a day (QID) | INTRAVENOUS | Status: AC
  Administered 2018-02-28 – 2018-03-01 (×3): 1 g via INTRAVENOUS
  Filled 2018-02-28 (×3): qty 50

## 2018-02-28 MED ORDER — ACETAMINOPHEN 325 MG PO TABS: 325.0000 mg | ORAL_TABLET | ORAL | Status: DC | PRN

## 2018-02-28 MED ORDER — FUROSEMIDE 20 MG PO TABS
20.0000 mg | ORAL_TABLET | Freq: Every day | ORAL | Status: DC
  Administered 2018-02-28 – 2018-03-01 (×2): 20 mg via ORAL
  Filled 2018-02-28: qty 1

## 2018-02-28 MED ORDER — SACUBITRIL-VALSARTAN 97-103 MG PO TABS
0.5000 | ORAL_TABLET | Freq: Two times a day (BID) | ORAL | Status: DC
  Administered 2018-02-28 – 2018-03-01 (×2): 0.5 via ORAL
  Filled 2018-02-28 (×2): qty 0.5

## 2018-02-28 MED ORDER — LIDOCAINE HCL (PF) 1 % IJ SOLN
INTRAMUSCULAR | Status: DC | PRN
  Administered 2018-02-28: 45 mL

## 2018-02-28 MED ORDER — FENTANYL CITRATE (PF) 100 MCG/2ML IJ SOLN
INTRAMUSCULAR | Status: AC
  Filled 2018-02-28: qty 2

## 2018-02-28 MED ORDER — CEFAZOLIN SODIUM-DEXTROSE 2-4 GM/100ML-% IV SOLN
INTRAVENOUS | Status: AC
  Filled 2018-02-28: qty 100

## 2018-02-28 MED ORDER — SODIUM CHLORIDE 0.9 % IV SOLN
INTRAVENOUS | Status: DC
  Administered 2018-02-28: 06:00:00 via INTRAVENOUS

## 2018-02-28 MED ORDER — MIDAZOLAM HCL 5 MG/5ML IJ SOLN
INTRAMUSCULAR | Status: DC | PRN
  Administered 2018-02-28: 2 mg via INTRAVENOUS
  Administered 2018-02-28 (×2): 1 mg via INTRAVENOUS

## 2018-02-28 MED ORDER — CEFAZOLIN SODIUM-DEXTROSE 2-4 GM/100ML-% IV SOLN
2.0000 g | INTRAVENOUS | Status: AC
  Administered 2018-02-28: 2 g via INTRAVENOUS
  Filled 2018-02-28: qty 100

## 2018-02-28 MED ORDER — FENTANYL CITRATE (PF) 100 MCG/2ML IJ SOLN
INTRAMUSCULAR | Status: DC | PRN
  Administered 2018-02-28 (×4): 25 ug via INTRAVENOUS

## 2018-02-28 MED ORDER — SODIUM CHLORIDE 0.9 % IV SOLN
80.0000 mg | INTRAVENOUS | Status: AC
  Administered 2018-02-28: 80 mg
  Filled 2018-02-28: qty 2

## 2018-02-28 MED ORDER — MIDAZOLAM HCL 5 MG/5ML IJ SOLN
INTRAMUSCULAR | Status: AC
  Filled 2018-02-28: qty 5

## 2018-02-28 MED ORDER — SODIUM CHLORIDE 0.9 % IV SOLN
INTRAVENOUS | Status: AC
  Filled 2018-02-28: qty 2

## 2018-02-28 MED ORDER — LIDOCAINE HCL (PF) 1 % IJ SOLN
INTRAMUSCULAR | Status: AC
  Filled 2018-02-28: qty 60

## 2018-02-28 MED ORDER — HEPARIN (PORCINE) IN NACL 1000-0.9 UT/500ML-% IV SOLN
INTRAVENOUS | Status: DC | PRN
  Administered 2018-02-28: 500 mL

## 2018-02-28 MED ORDER — HEPARIN (PORCINE) IN NACL 1000-0.9 UT/500ML-% IV SOLN
INTRAVENOUS | Status: AC
  Filled 2018-02-28: qty 500

## 2018-02-28 MED ORDER — ONDANSETRON HCL 4 MG/2ML IJ SOLN: 4.0000 mg | Freq: Four times a day (QID) | INTRAMUSCULAR | Status: DC | PRN

## 2018-02-28 MED ORDER — CHLORHEXIDINE GLUCONATE 4 % EX LIQD: 60.0000 mL | Freq: Once | CUTANEOUS | Status: DC

## 2018-02-28 SURGICAL SUPPLY — 17 items
CABLE SURGICAL S-101-97-12 (CABLE) ×3 IMPLANT
CATH ATTAIN SEL SURV 6248V-90 (CATHETERS) ×3 IMPLANT
CATH CPS DIRECT 135 DS2C020 (CATHETERS) ×3 IMPLANT
HEMOSTAT SURGICEL 2X4 FIBR (HEMOSTASIS) ×3 IMPLANT
ICD CLARIA MRI DTMA1QQ (ICD Generator) ×3 IMPLANT
KIT ESSENTIALS PG (KITS) ×3 IMPLANT
LEAD CAPSURE NOVUS 5076-52CM (Lead) ×3 IMPLANT
LEAD QUARTET 1458Q-86CM (Lead) ×3 IMPLANT
LEAD SPRINT QUAT SEC 6935M-62 (Lead) ×3 IMPLANT
PAD PRO RADIOLUCENT 2001M-C (PAD) ×3 IMPLANT
SHEATH CLASSIC 7F (SHEATH) ×3 IMPLANT
SHEATH CLASSIC 9.5F (SHEATH) ×3 IMPLANT
SHEATH CLASSIC 9F (SHEATH) ×3 IMPLANT
SLITTER UNIVERSAL DS2A003 (MISCELLANEOUS) ×3 IMPLANT
TRAY PACEMAKER INSERTION (PACKS) ×3 IMPLANT
WIRE ACUITY WHISPER EDS 4648 (WIRE) ×3 IMPLANT
WIRE HI TORQ VERSACORE-J 145CM (WIRE) ×3 IMPLANT

## 2018-02-28 NOTE — Interval H&P Note (Signed)
ICD Criteria  Current LVEF:14%. Within 12 months prior to implant: Yes   Heart failure history: Yes, Class II  Cardiomyopathy history: Yes, Non-Ischemic Cardiomyopathy.  Atrial Fibrillation/Atrial Flutter: No.  Ventricular tachycardia history: No.  Cardiac arrest history: No.  History of syndromes with risk of sudden death: No.  Previous ICD: Yes, Reason for ICD:  Primary prevention.  Current ICD indication: Primary  PPM indication: No.  Class I or II Bradycardia indication present: No  Beta Blocker therapy for 3 or more months: Yes, prescribed.   Ace Inhibitor/ARB therapy for 3 or more months: Yes, prescribed.    I have seen Miguel Patterson is a 73 y.o. malepre-procedural and has been referred by dr MP for consideration of ICD implant for primary prevention of sudden death.  The patient's chart has been reviewed and they meet criteria for ICD implant.  I have had a thorough discussion with the patient reviewing options.  The patient and their family (if available) have had opportunities to ask questions and have them answered. The patient and I have decided together through the [place] [employer] to implant ICD at this time.  Risks, benefits, alternatives to ICD implantation were discussed in detail with the patient today. The patient  understands that the risks include but are not limited to bleeding, infection, pneumothorax, perforation, tamponade, vascular damage, renal failure, MI, stroke, death, inappropriate shocks, and lead dislodgement and  wishes to proceed.  History and Physical Interval Note:  3/53/2992 4:26 AM  Miguel Patterson  has presented today for surgery, with the diagnosis of CM  The various methods of treatment have been discussed with the patient and family. After consideration of risks, benefits and other options for treatment, the patient has consented to  Procedure(s): BIV ICD INSERTION CRT-D (N/A) as a surgical  intervention .  The patient's history has been reviewed, patient examined, no change in status, stable for surgery.  I have reviewed the patient's chart and labs.  Questions were answered to the patient's satisfaction.     Virl Axe

## 2018-02-28 NOTE — Discharge Instructions (Signed)
° ° °  Supplemental Discharge Instructions for  Pacemaker/Defibrillator Patients  Activity No heavy lifting or vigorous activity with your left/right arm for 6 to 8 weeks.  Do not raise your left/right arm above your head for one week.  Gradually raise your affected arm as drawn below.              03/04/2018                  03/05/2018                   03/06/2018                03/07/2018 __  NO DRIVING for  1 week   ; you may begin driving on  03/03/6120   .  WOUND CARE - Keep the wound area clean and dry.  Do not get this area wet for one week. No showers for one 24 hours you may shower on 03/02/2018  . - The tape/steri-strips on your wound will fall off; do not pull them off.  No bandage is needed on the site.  DO  NOT apply any creams, oils, or ointments to the wound area. - If you notice any drainage or discharge from the wound, any swelling or bruising at the site, or you develop a fever > 101? F after you are discharged home, call the office at once.  Special Instructions - You are still able to use cellular telephones; use the ear opposite the side where you have your pacemaker/defibrillator.  Avoid carrying your cellular phone near your device. - When traveling through airports, show security personnel your identification card to avoid being screened in the metal detectors.  Ask the security personnel to use the hand wand. - Avoid arc welding equipment, MRI testing (magnetic resonance imaging), TENS units (transcutaneous nerve stimulators).  Call the office for questions about other devices. - Avoid electrical appliances that are in poor condition or are not properly grounded. - Microwave ovens are safe to be near or to operate.  Additional information for defibrillator patients should your device go off: - If your device goes off ONCE and you feel fine afterward, notify the device clinic nurses. - If your device goes off ONCE and you do not feel well afterward, call 911. - If your device  goes off TWICE, call 911. - If your device goes off THREE times in one day, call 911.  DO NOT DRIVE YOURSELF OR A FAMILY MEMBER WITH A DEFIBRILLATOR TO THE HOSPITAL--CALL 911.

## 2018-02-28 NOTE — Progress Notes (Signed)
Pt lives alone. He was told he would spend the night tonight so no arrangements made for someone to be with him. Rennis Harding from cath lab notified.

## 2018-03-01 ENCOUNTER — Encounter (HOSPITAL_COMMUNITY): Payer: Self-pay | Admitting: Cardiology

## 2018-03-01 ENCOUNTER — Ambulatory Visit (HOSPITAL_COMMUNITY): Payer: Medicare Other

## 2018-03-01 DIAGNOSIS — I5022 Chronic systolic (congestive) heart failure: Secondary | ICD-10-CM

## 2018-03-01 DIAGNOSIS — Z886 Allergy status to analgesic agent status: Secondary | ICD-10-CM | POA: Diagnosis not present

## 2018-03-01 DIAGNOSIS — Z950 Presence of cardiac pacemaker: Secondary | ICD-10-CM

## 2018-03-01 DIAGNOSIS — Z79899 Other long term (current) drug therapy: Secondary | ICD-10-CM | POA: Diagnosis not present

## 2018-03-01 DIAGNOSIS — Z95810 Presence of automatic (implantable) cardiac defibrillator: Secondary | ICD-10-CM | POA: Diagnosis not present

## 2018-03-01 DIAGNOSIS — I5020 Unspecified systolic (congestive) heart failure: Secondary | ICD-10-CM | POA: Diagnosis not present

## 2018-03-01 DIAGNOSIS — I420 Dilated cardiomyopathy: Secondary | ICD-10-CM | POA: Diagnosis not present

## 2018-03-01 DIAGNOSIS — I447 Left bundle-branch block, unspecified: Secondary | ICD-10-CM | POA: Diagnosis not present

## 2018-03-01 DIAGNOSIS — E875 Hyperkalemia: Secondary | ICD-10-CM | POA: Diagnosis not present

## 2018-03-01 DIAGNOSIS — Z006 Encounter for examination for normal comparison and control in clinical research program: Secondary | ICD-10-CM | POA: Diagnosis not present

## 2018-03-01 HISTORY — DX: Presence of cardiac pacemaker: Z95.0

## 2018-03-01 HISTORY — DX: Presence of automatic (implantable) cardiac defibrillator: Z95.810

## 2018-03-01 LAB — BASIC METABOLIC PANEL
Anion gap: 9 (ref 5–15)
BUN: 19 mg/dL (ref 8–23)
CO2: 20 mmol/L — ABNORMAL LOW (ref 22–32)
Calcium: 8.8 mg/dL — ABNORMAL LOW (ref 8.9–10.3)
Chloride: 112 mmol/L — ABNORMAL HIGH (ref 98–111)
Creatinine, Ser: 1.26 mg/dL — ABNORMAL HIGH (ref 0.61–1.24)
GFR calc Af Amer: >60 mL/min (ref >60)
GFR calc non Af Amer: 57 mL/min — ABNORMAL LOW (ref >60)
Glucose, Bld: 102 mg/dL — ABNORMAL HIGH (ref 70–99)
Potassium: 4.1 mmol/L (ref 3.5–5.1)
Sodium: 141 mmol/L (ref 135–145)

## 2018-03-01 LAB — MAGNESIUM: Magnesium: 1.9 mg/dL (ref 1.7–2.4)

## 2018-03-01 MED ORDER — ACETAMINOPHEN 325 MG PO TABS: 325.0000 mg | ORAL_TABLET | ORAL | Status: DC | PRN

## 2018-03-01 NOTE — Discharge Summary (Signed)
Discharge Summary    Patient ID: Miguel Patterson MRN: [id number]; DOB: 17-Jul-1945  Admit date: 02/28/2018 Discharge date: 03/01/2018  Primary Care Provider: [name], MD  Primary Cardiologist: Miguel Prows, MD  Primary Electrophysiologist:  Miguel Axe, MD   Discharge Diagnoses    Principal Problem:   Congestive heart failure, NYHA class 3, chronic, systolic (HCC) Active Problems:   S/P biventricular cardiac pacemaker procedure 02/28/18 with ICD MDT    Left bundle branch block   Allergies Allergies  Allergen Reactions  . Aspirin     Stomach pain     Diagnostic Studies/Procedures    02/28/18 Procedure: dual chamber ICD implantation without intraoperative defibrillation threshold testing  Following the obtaining of informed consent the patient was brought to the electrophysiology laboratory in place of the fluoroscopic table in the supine position. After routine prep and drape, lidocaine was infiltrated in the prepectoral subclavicular region and an incision was made and carried down to the layer of the prepectoral fascia using electrocautery and sharp dissection. A pocket was formed similarly.  Thereafter attention was turned to gaining access to the extrathoracic left subclavian vein which was accomplished without difficulty and without the aspiration of air or puncture of the artery.Three separate venipunctures were accomplished Sequentially A 9 French sheath, a 9.5 French sheath and 78F sheath were placed through which were passed a Medtronic single coil active fixation defibrillator lead, model 6935 serial number [id number], a St Jude 135 CS cannulation sheath and a Medtronic MRI compatible 5076 active fixation atrial lead, serial number [id number] . They were passed under fluoroscopic guidance to the right ventricular apex, coronary sinus and R atrial appendage respectively.   In its location the bipolar R wave was 14 millivolts, impedance was 698 ohms, the pacing  threshold was 0.7 volts at 0.5 msec. Current at threshold was 1.3 mA. There was no diaphragmatic pacing at 10 V. The current of injury was brisk .  The coronary sinus was cannulated with outdifficulty. NonOcclusive venography demonstrated a MID lateral vein; this was targeted. A whisper wire was utilized allowing cannulation of the targeted vein and a St Jude lead, model 1458 Qwas deployed to a junction between the mid & distal thirds. In this location the Q-LV was 180; pacing threshold was 1.27millivolts , impedance was 635 ohms. There is no diaphragmatic pacing at 10 V.  The bipolar P wave was 3.2 millivolts, impedance was 705 ohms, the pacing threshold was 1.7 volts at 0.5 msec. Current at threshold was 2.5 mA. There was no diaphragmatic pacing at 10 V. The current of injury was brisk .   The leads were secured to the prepectoral fascia and then attached to a Medtronic MRI compatible ICD, serial number [id number]. Through the device, the bipolar R wave was 10.8 millivolts, impedance was 475 ohms, the pacing threshold was 0.75 volts at 0.4 msec. The bipolar P wave was 1.1 millivolts, impedance was 456 ohms, the pacing threshold was 0.75 volts at 0.4 msec. lLV impedance was 551 with a threshold of 0.75 V at 0.4 ms high-voltage impedance was 6 8 ohms.   The pocket was copiously irrigated with antibiotic containing saline solution. Hemostasis was assured, and the device and the leads were placed in the pocket and secured to the prepectoral fascia. Surgicell placed in pocket .   The wound was closed in 2 layers in normal fashion. The wound was washed dried and a dermabonD dressing was applied. Needle counts, sponge counts and instrument counts were correct at the  end of the procedure according to the staff.    Estimated blood loss <50 mL.   During this procedure medications were administered to achieve and maintain moderate conscious sedation while the patient's heart rate, blood pressure, and oxygen  saturation were continuously monitored and I was present face-to-face 100% of this time.   _____________      History of Present Illness     11 yoM with hx of NICM diagnosed in 2016- presumed NICM.  EF was 11% with ant. Wall thinning.  He has done well with medical therapy.  Chronic CHF class 2-3, freq PVCs, LBBB, and hyperkalemia with high dose entresto.    Pt was seen by Dr. Caryl Patterson and with discussion plans were made for BiV ICD placement.  Pt presented 02/28/18 and underwent procedure without complications.  Kept overnight.   Hospital Course     Consultants: none   Pt had Medtronic MRI compatible ICD, serial number [id number]. Placed 02/28/18.  Pt did well overnight.  Today pt seen and evaluated by Dr. Caryl Patterson and found stable for discharge.     CXR with well positioned Lt ant. Chest wall biventricular cardio defib. Device.  No pneumothorax.  Device interrogated without complications and device function is normal. _____________  Discharge Vitals Blood pressure 109/64, pulse (!) 56, temperature (!) 97.3 F (36.3 C), temperature source Oral, resp. rate 20, height 6' (1.829 m), weight 80.4 kg, SpO2 98 %.  Filed Weights   02/28/18 0547  Weight: 80.4 kg    Labs & Radiologic Studies    CBC No results for input(s): WBC, NEUTROABS, HGB, HCT, MCV, PLT in the last 72 hours. Basic Metabolic Panel Recent Labs    03/01/18 0714  NA 141  K 4.1  CL 112*  CO2 20*  GLUCOSE 102*  BUN 19  CREATININE 1.26*  CALCIUM 8.8*  MG 1.9   Liver Function Tests No results for input(s): AST, ALT, ALKPHOS, BILITOT, PROT, ALBUMIN in the last 72 hours. No results for input(s): LIPASE, AMYLASE in the last 72 hours. Cardiac Enzymes No results for input(s): CKTOTAL, CKMB, CKMBINDEX, TROPONINI in the last 72 hours. BNP Invalid input(s): POCBNP D-Dimer No results for input(s): DDIMER in the last 72 hours. Hemoglobin A1C No results for input(s): HGBA1C in the last 72 hours. Fasting Lipid Panel No  results for input(s): CHOL, HDL, LDLCALC, TRIG, CHOLHDL, LDLDIRECT in the last 72 hours. Thyroid Function Tests No results for input(s): TSH, T4TOTAL, T3FREE, THYROIDAB in the last 72 hours.  Invalid input(s): FREET3 _____________  Dg Chest 2 View  Result Date: 03/01/2018 CLINICAL DATA:  Pacemaker. EXAM: CHEST - 2 VIEW COMPARISON:  07/14/2014. FINDINGS: Cardiac silhouette is mildly enlarged. No mediastinal or hilar masses. No evidence of adenopathy. New left anterior chest wall biventricular cardioverter-defibrillator. Leads appear well positioned. Clear lungs.  No pleural effusion or pneumothorax. Skeletal structures are intact. IMPRESSION: 1. Well-positioned left anterior chest wall biventricular cardioverter-defibrillator. 2. Mild cardiomegaly. 3. No acute cardiopulmonary disease.  No pneumothorax. Electronically Signed   By: Lajean Manes M.D.   On: 03/01/2018 08:21   Disposition   Pt is being discharged home today in good condition.  Follow-up Plans & Appointments    Follow-up Information    [facility] Follow up.   Specialty:  Cardiology Why:  03/10/2018 @ 9:30, wound check visit Contact information: [address], [address] [place]       Deboraha Sprang, MD Follow up.   Specialty:  Cardiology Why:  06/03/2018 @ 2:15PM Contact information: [address] [address] [phone]          pacemaker /ICD instructions were given.   Discharge Medications   Allergies as of 03/01/2018      Reactions   Aspirin    Stomach pain      Medication List    TAKE these medications   acetaminophen 325 MG tablet Commonly known as:  TYLENOL Take 1-2 tablets (325-650 mg total) by mouth every 4 (four) hours as needed for mild pain.   carvedilol 3.125 MG tablet Commonly known as:  COREG Take 1 tablet (3.125 mg total) by mouth 2 (two) times daily with a meal.   ENTRESTO 97-103 MG Generic  drug:  sacubitril-valsartan Take 0.5 tablets by mouth 2 (two) times daily.   furosemide 20 MG tablet Commonly known as:  LASIX Take 1 tablet (20 mg total) by mouth daily.        Acute coronary syndrome (MI, NSTEMI, STEMI, etc) this admission?: No.    Outstanding Labs/Studies     Duration of Discharge Encounter   Greater than 30 minutes including physician time.  Signed, Cecilie Kicks, NP 03/01/2018, 12:35 PM

## 2018-03-01 NOTE — Progress Notes (Signed)
Call received from [place] patient had 5 beats Vtach. Text paged Dr [place] Lions. Orders received for morning labs of BMP and magnesium.

## 2018-03-01 NOTE — Progress Notes (Signed)
   Progress Note  Patient Name: Miguel Patterson Date of Encounter: 03/01/2018  Primary Cardiologist: Lavone Nian Primary Electrophysiologist: SK  Patient Profile     73 y.o. male 3admitted for ICD-CRT for primary prevention for NICM  Subjective   Without chest pain or SOB   Inpatient Medications    Scheduled Meds: . carvedilol  3.125 mg Oral BID WC  . furosemide  20 mg Oral Daily  . sacubitril-valsartan  0.5 tablet Oral BID   Continuous Infusions:  PRN Meds: acetaminophen, ondansetron (ZOFRAN) IV   Vital Signs    Vitals:   02/28/18 1708 02/28/18 2009 03/01/18 0000 03/01/18 0552  BP: 114/72 116/70 109/62 119/75  Pulse: 73 70 (!) 54 61  Resp: 12 16 20 18   Temp: 98.2 F (36.8 C) 98 F (36.7 C) (!) 97.4 F (36.3 C) (!) 97.5 F (36.4 C)  TempSrc: Oral Oral Oral Oral  SpO2: 97% 97% 95% 93%  Weight:      Height:        Intake/Output Summary (Last 24 hours) at 03/01/2018 0814 Last data filed at 03/01/2018 0600 Gross per 24 hour  Intake 240 ml  Output 220 ml  Net 20 ml   Filed Weights   02/28/18 0547  Weight: 80.4 kg    Telemetry    P-synchronous/ AV  pacing  - Personally Reviewed  ECG    /psp  - Personally Reviewed  Physical Exam    GEN: No acute distress.   Neck: JVDflat Pocket without  hematoma, swelling or tenderness  Cardiac: RRR, no  murmurs, rubs, or gallops.  Respiratory: Clear to auscultation bilaterally. GI: Soft, nontender, non-distended  MS:   edema; No deformity. Neuro:  Nonfocal  Psych: Normal affect  Skin Warm and dry   Labs    Chemistry Recent Labs  Lab 02/25/18 0822  NA 145*  K 4.9  CL 106  CO2 25  GLUCOSE 81  BUN 25  CREATININE 1.38*  CALCIUM 9.4  GFRNONAA 51*  GFRAA 59*     Hematology Recent Labs  Lab 02/25/18 0822  WBC 5.2  RBC 4.58  HGB 14.1  HCT 43.3  MCV 95  MCH 30.8  MCHC 32.6  RDW 12.7  PLT 137*    Cardiac EnzymesNo results for input(s): TROPONINI in the last 168 hours. No results for input(s):  TROPIPOC in the last 168 hours.   BNPNo results for input(s): BNP, PROBNP in the last 168 hours.   DDimer No results for input(s): DDIMER in the last 168 hours.   Radiology    CXR reviewed stable lead placmeent     Device Interrogation    Normal device function   Assessment & Plan    NICM  CHF  ICD CRT  Medtronic     Instructions given  Ok for discharge   Signed, Virl Axe, MD  03/01/2018, 8:14 AM

## 2018-03-01 NOTE — Progress Notes (Signed)
Dr [place] Lions made aware patient had an episode where by ICD did not capture. Per MD nurse to keep monitoring patient.

## 2018-03-05 ENCOUNTER — Telehealth: Payer: Self-pay

## 2018-03-05 NOTE — Telephone Encounter (Signed)
Weight looks good, he was 188 when he last saw me. Hope he is feeling well.

## 2018-03-05 NOTE — Telephone Encounter (Signed)
Pt called in left vm about his weight he said you requested it  2/29-180.4  3/1-177.4 3/2-180.4 3/3-181.6 3/4-180.6

## 2018-03-06 ENCOUNTER — Ambulatory Visit: Payer: Medicare Other | Admitting: Internal Medicine

## 2018-03-10 ENCOUNTER — Ambulatory Visit: Payer: Medicare Other

## 2018-03-10 ENCOUNTER — Encounter: Payer: Self-pay | Admitting: Cardiology

## 2018-03-10 NOTE — Telephone Encounter (Signed)
Opened in error

## 2018-03-11 NOTE — Progress Notes (Signed)
Subjective:   <[id number]<\/[id number]>_3  ID@: Miguel Patterson, male    DOB: 02/25/1945, 73 y.o.   MRN: [id number]  Miguel Sacramento, MD:  No chief complaint on file.   HPI    Miguel Patterson is 73 years old male from [place]. He has severe non ischemic dilated cardiomyopathy with severely depressed left-ventricular systolic function, Ejection fraction has been in teens for past 3 years in spite of optimal medical therapy. 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  Patient is on intermediate dose of Entresto. He had hyperkalemia with the highest dose of Entresto and thus it was decreased. He has renal insufficiency. He is also taking low dose carvedilol (Had severe bradycardia with intermediate dose) and furosemide. He is not on spironolactone because of renal insufficiency and hyperkalemia.  He was seen 1 month ago for follow up after recent CHF exacerbation.  Underwent BIV ICD CRT insertion on 02/28/2018 with Dr. Caryl Comes for nonischemic cardiomyopathy. He has recovered well and is hoping to resume exercising soon. He is to have wound check next week. No complications from site.  He feels that symptoms have been stable. States difficult to assess if back to baseline as he has been taking it easy in view of recent procedure. Weight has been stable.   No history of hypertension, diabetes or hypercholesterolemia. He does not smoke.  No history of MI. No history of thyroid problems. No history of TIA or CVA. Patient is from [place], he is not aware about having Chaga's disease at any time. There is concern for possible familial cardiomyopathy as one of his brothers also has an enlarged heart. He is professor at [employer], teaches part-time.  Past Medical History:  Diagnosis Date  . Cardiomegaly   . DCM (dilated cardiomyopathy) ([facility])   . Fatigue   . LBBB (left bundle branch  block)   . Mild tricuspid regurgitation   . Moderate aortic regurgitation   . Moderate mitral regurgitation   . Pericardial effusion    INSIGNIFICANT  . S/P biventricular cardiac pacemaker procedure 02/28/18 with ICD MDT  03/01/2018  . Short of breath on exertion   . Systolic heart failure ([facility])   . Trace pulmonic regurgitation by prior echocardiogram     Past Surgical History:  Procedure Laterality Date  . BIV ICD INSERTION CRT-D N/A 02/28/2018   Procedure: BIV ICD INSERTION CRT-D;  Surgeon: Deboraha Sprang, MD;  Location: [facility];  Service: Cardiovascular;  Laterality: N/A;  . LEG SURGERY Left 2000   DUE TO BROKE LEG    Family History  Problem Relation Age of Onset  . Hypertension Mother 66  . Thyroid disease Mother     Social History   Socioeconomic History  . Marital status: Divorced    Spouse name: Not on file  . Number of children: 3  . Years of education: Not on file  . Highest education level: Not on file  Occupational History  . Not on file  Social Needs  . Financial resource strain: Not on file  . Food insecurity:    Worry: Not on file    Inability: Not on file  . Transportation needs:    Medical: Not on file    Non-medical: Not on file  Tobacco Use  . Smoking status: Never Smoker  . Smokeless tobacco: Never Used  Substance and Sexual Activity  . Alcohol  use: No  . Drug use: No  . Sexual activity: Not on file  Lifestyle  . Physical activity:    Days per week: Not on file    Minutes per session: Not on file  . Stress: Not on file  Relationships  . Social connections:    Talks on phone: Not on file    Gets together: Not on file    Attends religious service: Not on file    Active member of club or organization: Not on file    Attends meetings of clubs or organizations: Not on file    Relationship status: Not on file  . Intimate partner violence:    Fear of current or ex partner: Not on file    Emotionally abused: Not on file     Physically abused: Not on file    Forced sexual activity: Not on file  Other Topics Concern  . Not on file  Social History Narrative  . Not on file    Current Outpatient Medications on File Prior to Visit  Medication Sig Dispense Refill  . acetaminophen (TYLENOL) 325 MG tablet Take 1-2 tablets (325-650 mg total) by mouth every 4 (four) hours as needed for mild pain.    . carvedilol (COREG) 3.125 MG tablet Take 1 tablet (3.125 mg total) by mouth 2 (two) times daily with a meal. 60 tablet 1  . furosemide (LASIX) 20 MG tablet Take 1 tablet (20 mg total) by mouth daily. 30 tablet 1  . sacubitril-valsartan (ENTRESTO) 97-103 MG Take 0.5 tablets by mouth 2 (two) times daily.     No current facility-administered medications on file prior to visit.      Review of Systems  Constitution: Negative for decreased appetite, malaise/fatigue, weight gain and weight loss.  Eyes: Negative for visual disturbance.  Cardiovascular: Positive for leg swelling (improved slightly). Negative for chest pain and dyspnea on exertion.  Respiratory: Negative for hemoptysis and wheezing.   Endocrine: Negative for cold intolerance and heat intolerance.  Skin: Negative for nail changes.  Musculoskeletal: Negative for myalgias.  Gastrointestinal: Negative for abdominal pain, nausea and vomiting.  Neurological: Negative for difficulty with concentration, dizziness, focal weakness and headaches.  Psychiatric/Behavioral: Negative for altered mental status and suicidal ideas.  All other systems reviewed and are negative.  Cardiac studies Echocardiogram 08/15/2017: Left ventricle cavity is severely dilated. Severe decrease in global wall motion. Doppler evidence of grade I (impaired) diastolic dysfunction, normal LAP. Left ventricle regional wall motion findings: No wall motion abnormalities. Calculated EF 13%. Left atrial cavity is severely dilated. Moderate (Grade II) aortic regurgitation. Moderate (Grade II) mitral  regurgitation. Mild tricuspid regurgitation. Mild pulmonary hypertension. Estimated pulmonary artery systolic pressure 30 mmHg. Trace pulmonic regurgitation. Insignificant pericardial effusion. IVC is dilated with respiratory variation. Estimated RA pressure 8 mmHg. Compared to previous study on 08/07/2016, pericardial effusion is new. PASP is relatively lower.  Exercise sestamibi stress test 08/27/2014: 1. The resting electrocardiogram demonstrated normal sinus rhythm, incomplete LBBB and no resting arrhythmias.  ST segment depression and T-wave inversion in inferior and lateral leads. Stress EKG is negative for ischemia. The patient performed treadmill exercise using a Bruce protocol, completing 6:26 minutes. Thre were occasional PVCs. The patient completed an estimated workload of 7.65 METS, 95% of the maximum predicted heart rate. The stress test was terminated because of dyspnea. 2. The LV is markedly dilated both at rest and stress images. The LV end diastolic volume was 829FA. Thre is anterior wall thinning. There is no evidence of  ischemia. Thre is severe generalized hypokinesis and LVEF markedly depressed at 11%.  Findings are consistent with dilated cardiomyopathy.  Clinical correlation is recommended.  Cardiac MRI 11/20/2017:  1. Severely dilated left ventricle with normal wall thickness and severely decreased systolic function (LVEF = 14%). There is severe diffuse hypokinesis and paradoxical septal motion consistent with LBBB. The ratio of non-compacted to compacted myocardium in the apical and mid portions of the ventricle is > 4:1. There is midwall late gadolinium enhancement in the basal anteroseptal and inferoseptal myocardium.   2. Normal right ventricular size, thickness and systolic function (LVEF = 55%). There are no regional wall motion abnormalities.   3. Severely dilated left atrium (59 mm), normal right atrial size.   4. Moderately dilated pulmonary artery measuring  36 mm.   5. Moderate aortic and mitral regurgitation, mild tricuspid regurgitation.   6. Mild pericardial effusion.   Collectively, these findings are consistent with end stage non-compaction cardiomyopathy.  Labs 01/31/2018: Creatinine 1.28, eGFR 56, Potassium 5.4, BMP otherwise normal. BNP 1833.5.   Labs 02/06/2018: Creatinine 1.2, EGFR 45, potassium 4.6, BNP 1073.6     Objective:    There were no vitals taken for this visit.  Physical Exam  Constitutional: He is oriented to person, place, and time. Vital signs are normal. He appears well-developed and well-nourished.  HENT:  Head: Normocephalic and atraumatic.  Neck: Normal range of motion. No JVD present.  Cardiovascular: Normal rate, regular rhythm and intact distal pulses. Exam reveals no gallop.  Murmur heard.  Early systolic murmur is present with a grade of 1/6 at the upper right sternal border. Pulmonary/Chest: Effort normal and breath sounds normal. No accessory muscle usage. No respiratory distress.    ICD pocket scar  Abdominal: Soft. Bowel sounds are normal.  Musculoskeletal: Normal range of motion.        General: Edema present.  Neurological: He is alert and oriented to person, place, and time.  Skin: Skin is warm, dry and intact.  Vitals reviewed.          Assessment & Recommendations:  1. Chronic combined systolic and diastolic heart failure, NYHA class 2 (HCC) Symptomatically and clinically patient has been stable. He is on max tolerated guideline directed therapy. Patient is eager to start back with exercising, feel that his reasonable that he can start back with walking daily, but not to do any running, swimming, or arm exercises. He will further discuss with EP next week as well. Has trace leg edema today and weight has been stable.   2. S/P biventricular cardiac pacemaker procedure 02/28/18 with ICD MDT  Medtronic MRI compatible ICD, serial number [id number] by Dr. Wardell Heath procedure  well. Mild discomfort to the insertion site. Site appears to be healing well. He will have wound care appt next week and see Dr. Caryl Comes in the next few months. After his visit with Dr. Caryl Comes, we will take over management and follow ups.  3. Left ventricular non-compaction cardiomyopathy (HCC) LVEF of 14% by cardiac MRI with findings suggestive of end stage non-compaction cardiomyopathy.    Plan: Patient is overall doing well, but has not resumed normal activities yet. I would like to see him back in 1 month for close follow up to ensure symptomatically he remains stable with increasing his activity level.   Jeri Lager, FNP-C [facility], [place] Office: [phone] Fax: [phone]

## 2018-03-13 ENCOUNTER — Other Ambulatory Visit: Payer: Self-pay

## 2018-03-13 ENCOUNTER — Ambulatory Visit (INDEPENDENT_AMBULATORY_CARE_PROVIDER_SITE_OTHER): Payer: Medicare Other | Admitting: Cardiology

## 2018-03-13 ENCOUNTER — Encounter: Payer: Self-pay | Admitting: Cardiology

## 2018-03-13 VITALS — BP 115/60 | HR 63 | Wt 189.0 lb

## 2018-03-13 DIAGNOSIS — I5042 Chronic combined systolic (congestive) and diastolic (congestive) heart failure: Secondary | ICD-10-CM

## 2018-03-13 DIAGNOSIS — Z950 Presence of cardiac pacemaker: Secondary | ICD-10-CM | POA: Diagnosis not present

## 2018-03-13 DIAGNOSIS — I428 Other cardiomyopathies: Secondary | ICD-10-CM | POA: Diagnosis not present

## 2018-03-14 ENCOUNTER — Encounter: Payer: Self-pay | Admitting: Cardiology

## 2018-03-24 ENCOUNTER — Encounter (INDEPENDENT_AMBULATORY_CARE_PROVIDER_SITE_OTHER): Payer: Self-pay

## 2018-03-24 ENCOUNTER — Ambulatory Visit (INDEPENDENT_AMBULATORY_CARE_PROVIDER_SITE_OTHER): Payer: Medicare Other | Admitting: *Deleted

## 2018-03-24 ENCOUNTER — Other Ambulatory Visit: Payer: Self-pay

## 2018-03-24 DIAGNOSIS — Z95810 Presence of automatic (implantable) cardiac defibrillator: Secondary | ICD-10-CM

## 2018-03-24 DIAGNOSIS — I5042 Chronic combined systolic (congestive) and diastolic (congestive) heart failure: Secondary | ICD-10-CM

## 2018-03-24 DIAGNOSIS — I420 Dilated cardiomyopathy: Secondary | ICD-10-CM | POA: Diagnosis not present

## 2018-03-24 NOTE — Patient Instructions (Signed)
Monitor your site for redness, swelling, drainage, fever/chills, or a pimple-like bump. If any of these symptoms are noted, please call the [facility] at [phone].

## 2018-03-25 ENCOUNTER — Ambulatory Visit: Payer: Medicare Other

## 2018-03-26 LAB — CUP PACEART INCLINIC DEVICE CHECK
Battery Remaining Longevity: 124 mo
Battery Voltage: 3.17 V
Brady Statistic AP VS Percent: 0.41 %
Brady Statistic AS VP Percent: 78.01 %
Brady Statistic AS VS Percent: 2.44 %
Brady Statistic RA Percent Paced: 18.78 %
Brady Statistic RV Percent Paced: 0.17 %
Date Time Interrogation Session: 20200323162243
HighPow Impedance: 64 Ohm
Implantable Lead Implant Date: 20200228
Implantable Lead Implant Date: 20200228
Implantable Lead Implant Date: 20200228
Implantable Lead Location: 753858
Implantable Lead Location: 753859
Implantable Lead Model: 5076
Implantable Pulse Generator Implant Date: 20200228
Lead Channel Impedance Value: 256.5 Ohm
Lead Channel Impedance Value: 256.5 Ohm
Lead Channel Impedance Value: 256.5 Ohm
Lead Channel Impedance Value: 261.164
Lead Channel Impedance Value: 261.164
Lead Channel Impedance Value: 342 Ohm
Lead Channel Impedance Value: 399 Ohm
Lead Channel Impedance Value: 513 Ohm
Lead Channel Impedance Value: 513 Ohm
Lead Channel Impedance Value: 513 Ohm
Lead Channel Impedance Value: 532 Ohm
Lead Channel Impedance Value: 874 Ohm
Lead Channel Impedance Value: 874 Ohm
Lead Channel Impedance Value: 874 Ohm
Lead Channel Impedance Value: 893 Ohm
Lead Channel Impedance Value: 893 Ohm
Lead Channel Pacing Threshold Amplitude: 0.75 V
Lead Channel Pacing Threshold Amplitude: 0.75 V
Lead Channel Pacing Threshold Amplitude: 1 V
Lead Channel Pacing Threshold Pulse Width: 0.4 ms
Lead Channel Pacing Threshold Pulse Width: 0.4 ms
Lead Channel Sensing Intrinsic Amplitude: 13.625 mV
Lead Channel Sensing Intrinsic Amplitude: 2 mV
Lead Channel Setting Pacing Amplitude: 1.5 V
Lead Channel Setting Pacing Pulse Width: 0.4 ms
Lead Channel Setting Sensing Sensitivity: 0.3 mV
MDC IDC LEAD LOCATION: 753860
MDC IDC MSMT LEADCHNL LV IMPEDANCE VALUE: 513 Ohm
MDC IDC MSMT LEADCHNL LV IMPEDANCE VALUE: 874 Ohm
MDC IDC MSMT LEADCHNL LV PACING THRESHOLD PULSEWIDTH: 0.4 ms
MDC IDC SET LEADCHNL RA PACING AMPLITUDE: 3.5 V
MDC IDC SET LEADCHNL RV PACING AMPLITUDE: 3.5 V
MDC IDC SET LEADCHNL RV PACING PULSEWIDTH: 0.4 ms
MDC IDC STAT BRADY AP VP PERCENT: 19.14 %

## 2018-03-26 NOTE — Progress Notes (Signed)
Device Clinic CRT-D wound check appointment. Dermabond removed. Wound without redness or edema. Visible portion of a stitch removed from incision, antibiotic ointment and bandaid applied, patient instructed to wash site daily with soap and water, monitor for signs/symptoms of infection. Incision edges otherwise fully approximated and healing well.   Normal device function. Thresholds, sensing, and impedances consistent with implant measurements. Device programmed at 3.5V with autocapture for extra safety margin until 3 month visit. Histogram distribution appropriate for patient and level of activity. BiV pacing 92.3% (effective 92.2%). No mode switches or ventricular arrhythmias noted. Patient educated about wound care, arm mobility, lifting restrictions, shock plan, and Carelink monitor. ROV with SK on 06/03/18.

## 2018-04-15 ENCOUNTER — Encounter: Payer: Self-pay | Admitting: Cardiology

## 2018-04-15 ENCOUNTER — Other Ambulatory Visit: Payer: Self-pay

## 2018-04-15 ENCOUNTER — Ambulatory Visit (INDEPENDENT_AMBULATORY_CARE_PROVIDER_SITE_OTHER): Payer: Medicare Other | Admitting: Cardiology

## 2018-04-15 VITALS — BP 115/60 | HR 63 | Ht 72.0 in | Wt 178.8 lb

## 2018-04-15 DIAGNOSIS — I5042 Chronic combined systolic (congestive) and diastolic (congestive) heart failure: Secondary | ICD-10-CM | POA: Diagnosis not present

## 2018-04-15 DIAGNOSIS — I428 Other cardiomyopathies: Secondary | ICD-10-CM

## 2018-04-15 DIAGNOSIS — Z95810 Presence of automatic (implantable) cardiac defibrillator: Secondary | ICD-10-CM | POA: Diagnosis not present

## 2018-04-15 NOTE — Progress Notes (Signed)
Subjective:   <[id number]<\/[id number]>_6  ID@: [name], male    DOB: [birth year], 73 y.o.   MRN: [id number]  [name], MD:  Chief Complaint  Patient presents with  . Congestive Heart Failure   This visit type was conducted due to national recommendations for restrictions regarding the COVID-19 Pandemic (e.g. social distancing).  This format is felt to be most appropriate for this patient at this time.  All issues noted in this document were discussed and addressed.  No physical exam was performed (except for noted visual exam findings with Telehealth visits).  The patient has consented to conduct a Telehealth visit and understands insurance will be billed.   I discussed the limitations of evaluation and management by telemedicine and the availability of in person appointments. The patient expressed understanding and agreed to proceed.  Virtual Visit via Video Note is as below  I connected with Mr. Illescas, on 04/15/18 at 0900 by telephone and verified that I am speaking with the correct person using two identifiers. Unable to perform video visit with the patient.     I have discussed with her regarding the safety during COVID Pandemic and steps and precautions including social distancing with the patient.    HPI:   Mr. Hosking is 73 years old male from [place]. He has severe non ischemic dilated cardiomyopathy with severely depressed left-ventricular systolic function, Ejection fraction has been in teens for past 3 years in spite of optimal medical therapy. 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  Patient is on intermediate dose of Entresto. He had hyperkalemia with the highest dose of Entresto and thus it was decreased. He has renal insufficiency. He is also taking low dose carvedilol (Had severe bradycardia with intermediate dose) and furosemide.  He is not on spironolactone because of renal insufficiency and hyperkalemia.  Underwent BIV ICD CRT insertion on 02/28/2018 with Dr. Caryl Comes for nonischemic cardiomyopathy. He has recovered well. No complications from site.  This is a 4 week follow up visit. Patient states that he is overall doing well without any significant complaints. He has been doing some light exercises at home that he tolerates well. He is still not doing any heavy lifting as recommend by EP. 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  No history of hypertension, diabetes or hypercholesterolemia. He does not smoke.  No history of MI. No history of thyroid problems. No history of TIA or CVA. Patient is from [place], he is not aware about having Chaga's disease at any time. There is concern for possible familial cardiomyopathy as one of his brothers also has an enlarged heart. He is professor at [employer], teaches part-time.  Past Medical History:  Diagnosis Date  . Cardiomegaly   . DCM (dilated cardiomyopathy) ([place])   . Fatigue   . LBBB (left bundle branch block)   . Mild tricuspid regurgitation   . Moderate aortic regurgitation   . Moderate mitral regurgitation   . Pericardial effusion    INSIGNIFICANT  . S/P biventricular cardiac pacemaker procedure 02/28/18 with ICD MDT  03/01/2018  . Short of breath on exertion   . Systolic heart failure ([place])   . Trace pulmonic regurgitation by prior echocardiogram     Past Surgical History:  Procedure Laterality Date  . BIV ICD INSERTION CRT-D N/A 02/28/2018  Procedure: BIV ICD INSERTION CRT-D;  Surgeon: Deboraha Sprang, MD;  Location: [facility];  Service: Cardiovascular;  Laterality: N/A;  . LEG SURGERY Left 2000   DUE TO BROKE LEG    Family History  Problem Relation Age of Onset  . Hypertension Mother 61  . Thyroid disease Mother     Social History   Socioeconomic History  . Marital  status: Divorced    Spouse name: Not on file  . Number of children: 3  . Years of education: Not on file  . Highest education level: Not on file  Occupational History  . Not on file  Social Needs  . Financial resource strain: Not on file  . Food insecurity:    Worry: Not on file    Inability: Not on file  . Transportation needs:    Medical: Not on file    Non-medical: Not on file  Tobacco Use  . Smoking status: Never Smoker  . Smokeless tobacco: Never Used  Substance and Sexual Activity  . Alcohol use: No  . Drug use: No  . Sexual activity: Not on file  Lifestyle  . Physical activity:    Days per week: Not on file    Minutes per session: Not on file  . Stress: Not on file  Relationships  . Social connections:    Talks on phone: Not on file    Gets together: Not on file    Attends religious service: Not on file    Active member of club or organization: Not on file    Attends meetings of clubs or organizations: Not on file    Relationship status: Not on file  . Intimate partner violence:    Fear of current or ex partner: Not on file    Emotionally abused: Not on file    Physically abused: Not on file    Forced sexual activity: Not on file  Other Topics Concern  . Not on file  Social History Narrative  . Not on file    Current Outpatient Medications on File Prior to Visit  Medication Sig Dispense Refill  . carvedilol (COREG) 3.125 MG tablet Take 1 tablet (3.125 mg total) by mouth 2 (two) times daily with a meal. 60 tablet 1  . furosemide (LASIX) 20 MG tablet Take 1 tablet (20 mg total) by mouth daily. 30 tablet 1  . sacubitril-valsartan (ENTRESTO) 97-103 MG Take 0.5 tablets by mouth 2 (two) times daily.     No current facility-administered medications on file prior to visit.      Review of Systems  Constitution: Negative for decreased appetite, malaise/fatigue, weight gain and weight loss.  Eyes: Negative for visual disturbance.  Cardiovascular: Positive for  leg swelling (improved slightly). Negative for chest pain and dyspnea on exertion.  Respiratory: Negative for hemoptysis and wheezing.   Endocrine: Negative for cold intolerance and heat intolerance.  Skin: Negative for nail changes.  Musculoskeletal: Negative for myalgias.  Gastrointestinal: Negative for abdominal pain, nausea and vomiting.  Neurological: Negative for difficulty with concentration, dizziness, focal weakness and headaches.  Psychiatric/Behavioral: Negative for altered mental status and suicidal ideas.  All other systems reviewed and are negative.  Cardiac studies Echocardiogram 08/15/2017: Left ventricle cavity is severely dilated. Severe decrease in global wall motion. Doppler evidence of grade I (impaired) diastolic dysfunction, normal LAP. Left ventricle regional wall motion findings: No wall motion abnormalities. Calculated EF 13%. Left atrial cavity is severely dilated. Moderate (Grade II) aortic regurgitation. Moderate (Grade II)  mitral regurgitation. Mild tricuspid regurgitation. Mild pulmonary hypertension. Estimated pulmonary artery systolic pressure 30 mmHg. Trace pulmonic regurgitation. Insignificant pericardial effusion. IVC is dilated with respiratory variation. Estimated RA pressure 8 mmHg. Compared to previous study on 08/07/2016, pericardial effusion is new. PASP is relatively lower.  Exercise sestamibi stress test 08/27/2014: 1. The resting electrocardiogram demonstrated normal sinus rhythm, incomplete LBBB and no resting arrhythmias.  ST segment depression and T-wave inversion in inferior and lateral leads. Stress EKG is negative for ischemia. The patient performed treadmill exercise using a Bruce protocol, completing 6:26 minutes. Thre were occasional PVCs. The patient completed an estimated workload of 7.65 METS, 95% of the maximum predicted heart rate. The stress test was terminated because of dyspnea. 2. The LV is markedly dilated both at rest and stress  images. The LV end diastolic volume was 009FG. Thre is anterior wall thinning. There is no evidence of ischemia. Thre is severe generalized hypokinesis and LVEF markedly depressed at 11%.  Findings are consistent with dilated cardiomyopathy.  Clinical correlation is recommended.  Cardiac MRI 11/20/2017:  1. Severely dilated left ventricle with normal wall thickness and severely decreased systolic function (LVEF = 14%). There is severe diffuse hypokinesis and paradoxical septal motion consistent with LBBB. The ratio of non-compacted to compacted myocardium in the apical and mid portions of the ventricle is > 4:1. There is midwall late gadolinium enhancement in the basal anteroseptal and inferoseptal myocardium.   2. Normal right ventricular size, thickness and systolic function (LVEF = 55%). There are no regional wall motion abnormalities.   3. Severely dilated left atrium (59 mm), normal right atrial size.   4. Moderately dilated pulmonary artery measuring 36 mm.   5. Moderate aortic and mitral regurgitation, mild tricuspid regurgitation.   6. Mild pericardial effusion.   Collectively, these findings are consistent with end stage non-compaction cardiomyopathy.  Labs 01/31/2018: Creatinine 1.28, eGFR 56, Potassium 5.4, BMP otherwise normal. BNP 1833.5.   Labs 02/06/2018: Creatinine 1.2, EGFR 45, potassium 4.6, BNP 1073.6     Objective:    Blood pressure 115/60, pulse 63, height 6' (1.829 m), weight 178 lb 12.8 oz (81.1 kg).  *Physical exam not performed as this is a telephone visit*     Assessment & Recommendations:  1. Chronic combined systolic and diastolic heart failure, NYHA class 2 (HCC) Symptomatically and clinically patient has been stable. He is on max tolerated guideline directed therapy. Weight has been stable. Does continue to have trace leg edema. Encouraged him to continue with frequent breaks from sitting at his computer, leg elevation, and to try support  stockings. Will continue with daily lasix. He has resumed light home exercises that he is tolerating well.   2. Biventricular implantable cardioverter-defibrillator (ICD) in situ Medtronic MRI compatible ICD, serial number [id number] by Dr. Caryl Comes  Recently had wound check and pacemaker check with Dr. Olin Pia office. Stable. No complications. He is to follow up with Dr. Caryl Comes in early June. Will take over management of pacemaker after his appt with him.  3. Left ventricular non-compaction cardiomyopathy (HCC) LVEF of 14% by cardiac MRI with findings suggestive of end stage non-compaction cardiomyopathy.    Plan: Patient is overall doing well. No changes were made to medications today. I will see him back in 2-3 months for follow up, but encouraged him to contact us sooner if needed.    Jeri Lager, FNP-C [facility], [place] Office: [phone] Fax: [phone]

## 2018-04-18 ENCOUNTER — Other Ambulatory Visit: Payer: Self-pay | Admitting: Cardiology

## 2018-05-14 ENCOUNTER — Other Ambulatory Visit: Payer: Self-pay | Admitting: Cardiology

## 2018-05-15 NOTE — Telephone Encounter (Signed)
Please fill

## 2018-05-27 ENCOUNTER — Telehealth: Payer: Self-pay

## 2018-05-27 NOTE — Telephone Encounter (Signed)
I called and left patient a message about changing office visit on 06/03/18 to telehealth visit.

## 2018-05-28 NOTE — Telephone Encounter (Signed)

## 2018-06-02 ENCOUNTER — Ambulatory Visit (INDEPENDENT_AMBULATORY_CARE_PROVIDER_SITE_OTHER): Payer: Medicare Other | Admitting: *Deleted

## 2018-06-02 DIAGNOSIS — I420 Dilated cardiomyopathy: Secondary | ICD-10-CM

## 2018-06-03 ENCOUNTER — Telehealth (INDEPENDENT_AMBULATORY_CARE_PROVIDER_SITE_OTHER): Payer: Medicare Other | Admitting: Internal Medicine

## 2018-06-03 ENCOUNTER — Other Ambulatory Visit: Payer: Self-pay

## 2018-06-03 VITALS — BP 106/53 | HR 50 | Ht 72.0 in | Wt 180.0 lb

## 2018-06-03 DIAGNOSIS — I5042 Chronic combined systolic (congestive) and diastolic (congestive) heart failure: Secondary | ICD-10-CM | POA: Diagnosis not present

## 2018-06-03 DIAGNOSIS — I420 Dilated cardiomyopathy: Secondary | ICD-10-CM

## 2018-06-03 DIAGNOSIS — Z95810 Presence of automatic (implantable) cardiac defibrillator: Secondary | ICD-10-CM

## 2018-06-03 LAB — CUP PACEART REMOTE DEVICE CHECK
Battery Remaining Longevity: 119 mo
Battery Voltage: 3.12 V
Brady Statistic AP VP Percent: 33.78 %
Brady Statistic AP VS Percent: 0.94 %
Brady Statistic AS VP Percent: 63.21 %
Brady Statistic AS VS Percent: 2.07 %
Brady Statistic RA Percent Paced: 32.9 %
Brady Statistic RV Percent Paced: 0.72 %
Date Time Interrogation Session: 20200601052503
HighPow Impedance: 65 Ohm
Implantable Lead Implant Date: 20200228
Implantable Lead Implant Date: 20200228
Implantable Lead Implant Date: 20200228
Implantable Lead Location: 753858
Implantable Lead Location: 753859
Implantable Lead Location: 753860
Implantable Lead Model: 5076
Implantable Pulse Generator Implant Date: 20200228
Lead Channel Impedance Value: 201.488
Lead Channel Impedance Value: 205.114
Lead Channel Impedance Value: 211.891
Lead Channel Impedance Value: 232.653
Lead Channel Impedance Value: 241.412
Lead Channel Impedance Value: 304 Ohm
Lead Channel Impedance Value: 361 Ohm
Lead Channel Impedance Value: 418 Ohm
Lead Channel Impedance Value: 456 Ohm
Lead Channel Impedance Value: 475 Ohm
Lead Channel Impedance Value: 475 Ohm
Lead Channel Impedance Value: 513 Ohm
Lead Channel Impedance Value: 760 Ohm
Lead Channel Impedance Value: 760 Ohm
Lead Channel Impedance Value: 760 Ohm
Lead Channel Impedance Value: 779 Ohm
Lead Channel Impedance Value: 817 Ohm
Lead Channel Impedance Value: 836 Ohm
Lead Channel Pacing Threshold Amplitude: 0.625 V
Lead Channel Pacing Threshold Amplitude: 0.875 V
Lead Channel Pacing Threshold Amplitude: 1.125 V
Lead Channel Pacing Threshold Pulse Width: 0.4 ms
Lead Channel Pacing Threshold Pulse Width: 0.4 ms
Lead Channel Pacing Threshold Pulse Width: 0.4 ms
Lead Channel Sensing Intrinsic Amplitude: 14.75 mV
Lead Channel Sensing Intrinsic Amplitude: 14.75 mV
Lead Channel Sensing Intrinsic Amplitude: 2 mV
Lead Channel Sensing Intrinsic Amplitude: 2 mV
Lead Channel Setting Pacing Amplitude: 1 V
Lead Channel Setting Pacing Amplitude: 2.25 V
Lead Channel Setting Pacing Amplitude: 2.25 V
Lead Channel Setting Pacing Pulse Width: 0.4 ms
Lead Channel Setting Pacing Pulse Width: 0.4 ms
Lead Channel Setting Sensing Sensitivity: 0.3 mV

## 2018-06-03 MED ORDER — MAGNESIUM OXIDE 400 MG PO TABS: 400.0000 mg | ORAL_TABLET | Freq: Every day | ORAL | Status: DC

## 2018-06-03 NOTE — Patient Instructions (Signed)
Called pt and he verbalized understanding of the following recommendations:    Medication Instructions:  Your physician has recommended you make the following change in your medication:   As long as your labs come back normal, please begin Mag Ox 400mg , qd  Labwork: Your physician recommends that you return for lab work in: BMP and Mg  Testing/Procedures: None ordered.  Follow-Up: Your physician recommends that you schedule a follow-up appointment:  Sept 7 at 1:30pm with Dr Caryl Comes   Any Other Special Instructions Will Be Listed Below (If Applicable).     If you need a refill on your cardiac medications before your next appointment, please call your pharmacy.

## 2018-06-03 NOTE — Progress Notes (Signed)
Electrophysiology TeleHealth Note   Due to national recommendations of social distancing due to COVID 19, an audio/video telehealth visit is felt to be most appropriate for this patient at this time.  See MyChart message from today for the patient's consent to telehealth for [facility].   Date:  03/08/3426   ID:  Miguel Patterson, DOB [birth year], MRN [id number]  Location: patient's home  Provider location: [address] [place]  Evaluation Performed: Follow-up visit  PCP:  [name], MD  Cardiologist:    Electrophysiologist:  SK   Chief Complaint:  CRT  CHF   History of Present Illness:    Miguel Patterson is a 73 y.o. male who presents via audio/video conferencing for a telehealth visit today.  Since last being seen in our clinic, the patient reports doing significantly better with less dyspnea   Has episode of abrupt onset and offset dizziness, duration about 90 sec-- no sustained episodes noted but did have nonsustained runs of VT for about 180 secs which did   correlate with time   Tolerating meds   DATE TEST EF   8/16 MYOVIEW  11 % PVCs noted  8/18 Echo  13 % LAE severe MR Mod  8/19 Echo  13%   cMRI cMRI 14%  gadolinium enhancement basal septal non-compacted/compacted ratio 4: 1   Date Cr K Hgb  2//20 1.26 4.1 14.1           The patient denies symptoms of fevers, chills, cough, or new SOB worrisome for COVID 19. *   Past Medical History:  Diagnosis Date  . Cardiomegaly   . DCM (dilated cardiomyopathy) ([place])   . Fatigue   . LBBB (left bundle branch block)   . Mild tricuspid regurgitation   . Moderate aortic regurgitation   . Moderate mitral regurgitation   . Pericardial effusion    INSIGNIFICANT  . S/P biventricular cardiac pacemaker procedure 02/28/18 with ICD MDT  03/01/2018  . Short of breath on exertion   . Systolic heart failure ([place])   . Trace pulmonic regurgitation by prior echocardiogram     Past Surgical  History:  Procedure Laterality Date  . BIV ICD INSERTION CRT-D N/A 02/28/2018   Procedure: BIV ICD INSERTION CRT-D;  Surgeon: Deboraha Sprang, MD;  Location: [facility];  Service: Cardiovascular;  Laterality: N/A;  . LEG SURGERY Left 2000   DUE TO BROKE LEG    Current Outpatient Medications  Medication Sig Dispense Refill  . carvedilol (COREG) 3.125 MG tablet TAKE 1 TABLET BY MOUTH TWICE DAILY WITH MEALS 60 tablet 3  . furosemide (LASIX) 20 MG tablet TAKE 1 TABLET BY MOUTH DAILY 90 tablet 1  . sacubitril-valsartan (ENTRESTO) 97-103 MG Take 0.5 tablets by mouth 2 (two) times daily.     No current facility-administered medications for this visit.     Allergies:   Aspirin   Social History:  The patient  reports that he has never smoked. He has never used smokeless tobacco. He reports that he does not drink alcohol or use drugs.   Family History:  The patient's   family history includes Hypertension (age of onset: 46) in his mother; Thyroid disease in his mother.   ROS:  Please see the history of present illness.   All other systems are personally reviewed and negative.    Exam:    Vital Signs:  BP (!) 106/53   Pulse (!) 50   Ht 6' (  1.829 m)   Wt 180 lb (81.6 kg)   BMI 24.41 kg/m     Well appearing, alert and conversant, regular work of breathing,  good skin color Eyes- anicteric, neuro- grossly intact, skin- no apparent rash or lesions or cyanosis, mouth- oral mucosa is pink   Labs/Other Tests and Data Reviewed:    Recent Labs: 02/06/2018: BNP 1,073.6 02/25/2018: Hemoglobin 14.1; Platelets 137 03/01/2018: BUN 19; Creatinine, Ser 1.26; Magnesium 1.9; Potassium 4.1; Sodium 141   Wt Readings from Last 3 Encounters:  06/03/18 180 lb (81.6 kg)  04/15/18 178 lb 12.8 oz (81.1 kg)  03/13/18 189 lb (85.7 kg)     Other studies personally reviewed: Additional studies/ records that were reviewed today include: As above     Last device remote is reviewed from [place] PDF  dated 6/20 which reveals normal device function,   arrhythmias   Non sustained VT repeatedly    ASSESSMENT & PLAN:    Cardiomyopathy-presumed nonischemic possibly non-compaction  Congestive heart failure-class 2  chronic  Ventricular ectopy-frequent and complex  Left bundle branch block  Hyperkalemia with Entresto-high dose\  VT- NS   Nonsustained VT runs was assoc with signicant dizziness   Isolated but has hx of complex ectopy-- will check electrolytes and with normal renal function will begin him on Mag Oxide   CHF is significantly improved with less symptoms  Continue current meds and will try at followup to advance Guideline directed medical therapy   Will plan echo also to reassess LV function     COVID 19 screen The patient denies symptoms of COVID 19 at this time.  The importance of social distancing was discussed today.  Follow-up:  10m Next remote: As Scheduled   Current medicines are reviewed at length with the patient today.   The patient does not have concerns regarding his medicines.  The following changes were made today:  Mg Oxide 400 daily   Labs/ tests ordered today include: Bmet, Mg No orders of the defined types were placed in this encounter.   Future tests ( post COVID )     Patient Risk:  after full review of this patients clinical status, I feel that they are at moderate risk at this time.  Today, I have spent 16 minutes with the patient with telehealth technology discussing the above.  Signed, Virl Axe, MD  06/03/2018 4:12 PM     [address] [place] [address] [phone] (office) [phone] (fax)

## 2018-06-04 ENCOUNTER — Other Ambulatory Visit: Payer: Medicare Other | Admitting: *Deleted

## 2018-06-04 DIAGNOSIS — Z95810 Presence of automatic (implantable) cardiac defibrillator: Secondary | ICD-10-CM | POA: Diagnosis not present

## 2018-06-04 DIAGNOSIS — I420 Dilated cardiomyopathy: Secondary | ICD-10-CM | POA: Diagnosis not present

## 2018-06-04 DIAGNOSIS — I5042 Chronic combined systolic (congestive) and diastolic (congestive) heart failure: Secondary | ICD-10-CM | POA: Diagnosis not present

## 2018-06-04 LAB — BASIC METABOLIC PANEL
BUN/Creatinine Ratio: 18 (ref 10–24)
BUN: 27 mg/dL (ref 8–27)
CO2: 24 mmol/L (ref 20–29)
Calcium: 9.7 mg/dL (ref 8.6–10.2)
Chloride: 102 mmol/L (ref 96–106)
Creatinine, Ser: 1.5 mg/dL — ABNORMAL HIGH (ref 0.76–1.27)
GFR calc Af Amer: 53 mL/min/{1.73_m2} — ABNORMAL LOW (ref >59)
GFR calc non Af Amer: 46 mL/min/{1.73_m2} — ABNORMAL LOW (ref >59)
Glucose: 93 mg/dL (ref 65–99)
Potassium: 4.6 mmol/L (ref 3.5–5.2)
Sodium: 144 mmol/L (ref 134–144)

## 2018-06-04 LAB — MAGNESIUM: Magnesium: 2.2 mg/dL (ref 1.6–2.3)

## 2018-06-09 ENCOUNTER — Encounter: Payer: Self-pay | Admitting: Cardiology

## 2018-06-09 NOTE — Progress Notes (Signed)
Remote ICD transmission.   

## 2018-06-16 ENCOUNTER — Other Ambulatory Visit: Payer: Self-pay

## 2018-06-16 ENCOUNTER — Ambulatory Visit (INDEPENDENT_AMBULATORY_CARE_PROVIDER_SITE_OTHER): Payer: Medicare Other | Admitting: Cardiology

## 2018-06-16 ENCOUNTER — Encounter: Payer: Self-pay | Admitting: Cardiology

## 2018-06-16 VITALS — BP 92/54 | HR 61 | Ht 72.0 in | Wt 178.0 lb

## 2018-06-16 DIAGNOSIS — Z95810 Presence of automatic (implantable) cardiac defibrillator: Secondary | ICD-10-CM

## 2018-06-16 DIAGNOSIS — N183 Chronic kidney disease, stage 3 unspecified: Secondary | ICD-10-CM

## 2018-06-16 DIAGNOSIS — I5042 Chronic combined systolic (congestive) and diastolic (congestive) heart failure: Secondary | ICD-10-CM | POA: Diagnosis not present

## 2018-06-16 DIAGNOSIS — I472 Ventricular tachycardia: Secondary | ICD-10-CM

## 2018-06-16 DIAGNOSIS — I4729 Other ventricular tachycardia: Secondary | ICD-10-CM

## 2018-06-16 DIAGNOSIS — I420 Dilated cardiomyopathy: Secondary | ICD-10-CM

## 2018-06-16 MED ORDER — FUROSEMIDE 20 MG PO TABS: 20.0000 mg | ORAL_TABLET | ORAL | 3 refills | Status: DC | PRN

## 2018-06-16 NOTE — [place] Notes (Signed)
Primary Physician:  [name], MD   Patient ID: Miguel Patterson, male    DOB: 1945/11/27, 73 y.o.   MRN: [id number]  Subjective:    Chief Complaint  Patient presents with  . Congestive Heart Failure  . Cardiomyopathy  . LBB  . Follow-up    24mo   This visit type was conducted due to national recommendations for restrictions regarding the COVID-19 Pandemic (e.g. social distancing).  This format is felt to be most appropriate for this patient at this time.  All issues noted in this document were discussed and addressed.  No physical exam was performed (except for noted visual exam findings with Telehealth visits).  The patient has consented to conduct a Telehealth visit and understands insurance will be billed.   I discussed the limitations of evaluation and management by telemedicine and the availability of in person appointments. The patient expressed understanding and agreed to proceed.  Virtual Visit via Video Note is as below  I connected with Miguel Patterson, on 06/16/18 at 1020 by a video enabled telemedicine application and verified that I am speaking with the correct person using two identifiers.     I have discussed with her regarding the safety during COVID Pandemic and steps and precautions including social distancing with the patient.    HPI: Miguel Patterson  is a 73 y.o. male  from [place]. He has severe non ischemic dilated cardiomyopathy with severely depressed left-ventricular systolic function, Ejection fraction has been in teens for past 3 years in spite of optimal medical therapy. 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 Underwent BIV ICD CRT insertion on 02/28/2018 with Dr. Caryl Comes for nonischemic cardiomyopathy.   Patient is on intermediate dose of Entresto. He had hyperkalemia with the highest dose of Entresto and thus it  was decreased. He has renal insufficiency. He is also taking low dose carvedilol (Had severe bradycardia with intermediate dose) and furosemide. He is not on spironolactone because of renal insufficiency and hyperkalemia.  This is a 3 month office visit. Patient states that he is overall doing well without any significant complaints. He has been doing some light exercises at home that he tolerates well. He was noted to have repeated episodes of NSVT on pacemaker transmission with Dr. Caryl Comes that was associated with abrupt onset of dizziness. He has been started on Mg Oxide. He has not had further episodes of dizziness. Weight has been stable from 177-180 lbs. He has noticed that his blood pressure has been in the 37'S systolic.  No history of hypertension, diabetes or hypercholesterolemia. He does not smoke.  No history of MI. No history of thyroid problems. No history of TIA or CVA. Patient is from [place], he is not aware about having Chaga's disease at any time. There is concern for possible familial cardiomyopathy as one of his brothers also has an enlarged heart. He is professor at [employer], teaches part-time.  Past Medical History:  Diagnosis Date  . Cardiomegaly   . DCM (dilated cardiomyopathy) ([place])   . Fatigue   . LBBB (left bundle branch block)   . Mild tricuspid regurgitation   . Moderate aortic regurgitation   . Moderate mitral regurgitation   . Pericardial effusion    INSIGNIFICANT  . S/P biventricular cardiac pacemaker procedure 02/28/18 with ICD MDT  03/01/2018  . Short of breath on exertion   . Systolic heart failure (  [place])   . Trace pulmonic regurgitation by prior echocardiogram     Past Surgical History:  Procedure Laterality Date  . BIV ICD INSERTION CRT-D N/A 02/28/2018   Procedure: BIV ICD INSERTION CRT-D;  Surgeon: Deboraha Sprang, MD;  Location: [facility];  Service: Cardiovascular;  Laterality: N/A;  . LEG SURGERY Left 2000   DUE TO BROKE LEG    Social  History   Socioeconomic History  . Marital status: Divorced    Spouse name: Not on file  . Number of children: 3  . Years of education: Not on file  . Highest education level: Not on file  Occupational History  . Not on file  Social Needs  . Financial resource strain: Not on file  . Food insecurity    Worry: Not on file    Inability: Not on file  . Transportation needs    Medical: Not on file    Non-medical: Not on file  Tobacco Use  . Smoking status: Never Smoker  . Smokeless tobacco: Never Used  Substance and Sexual Activity  . Alcohol use: No  . Drug use: No  . Sexual activity: Not on file  Lifestyle  . Physical activity    Days per week: Not on file    Minutes per session: Not on file  . Stress: Not on file  Relationships  . Social Herbalist on phone: Not on file    Gets together: Not on file    Attends religious service: Not on file    Active member of club or organization: Not on file    Attends meetings of clubs or organizations: Not on file    Relationship status: Not on file  . Intimate partner violence    Fear of current or ex partner: Not on file    Emotionally abused: Not on file    Physically abused: Not on file    Forced sexual activity: Not on file  Other Topics Concern  . Not on file  Social History Narrative  . Not on file    Review of Systems  Constitution: Negative for decreased appetite, malaise/fatigue, weight gain and weight loss.  Eyes: Negative for visual disturbance.  Cardiovascular: Negative for chest pain, dyspnea on exertion and leg swelling (improved slightly).  Respiratory: Negative for hemoptysis and wheezing.   Endocrine: Negative for cold intolerance and heat intolerance.  Skin: Negative for nail changes.  Musculoskeletal: Negative for myalgias.  Gastrointestinal: Negative for abdominal pain, nausea and vomiting.  Neurological: Negative for difficulty with concentration, dizziness, focal weakness and headaches.   Psychiatric/Behavioral: Negative for altered mental status and suicidal ideas.  All other systems reviewed and are negative.     Objective:  Blood pressure (!) 92/54, pulse 61, height 6' (1.829 m), weight 178 lb (80.7 kg). Body mass index is 24.14 kg/m.    Physical exam not performed or limited due to virtual visit.  Patient appeared to be in no distress, Neck was supple, respiration was not labored.  Please see exam details from prior visit is as below.   Physical Exam  Constitutional: He is oriented to person, place, and time. Vital signs are normal. He appears well-developed and well-nourished.  HENT:  Head: Normocephalic and atraumatic.  Neck: Normal range of motion.  Cardiovascular: Normal rate, regular rhythm, normal heart sounds and intact distal pulses.  Pulmonary/Chest: Effort normal and breath sounds normal. No accessory muscle usage. No respiratory distress.  Abdominal: Soft. Bowel sounds are normal.  Musculoskeletal: Normal range of motion.  Neurological: He is alert and oriented to person, place, and time.  Skin: Skin is warm and dry.  Vitals reviewed.  Radiology: No results found.  Laboratory examination:    CMP Latest Ref Rng & Units 06/04/2018 03/01/2018 02/25/2018  Glucose 65 - 99 mg/dL 93 102(H) 81  BUN 8 - 27 mg/dL 27 19 25   Creatinine 0.76 - 1.27 mg/dL 1.50(H) 1.26(H) 1.38(H)  Sodium 134 - 144 mmol/L 144 141 145(H)  Potassium 3.5 - 5.2 mmol/L 4.6 4.1 4.9  Chloride 96 - 106 mmol/L 102 112(H) 106  CO2 20 - 29 mmol/L 24 20(L) 25  Calcium 8.6 - 10.2 mg/dL 9.7 8.8(L) 9.4   CBC Latest Ref Rng & Units 02/25/2018  WBC 3.4 - 10.8 x10E3/uL 5.2  Hemoglobin 13.0 - 17.7 g/dL 14.1  Hematocrit 37.5 - 51.0 % 43.3  Platelets 150 - 450 x10E3/uL 137(L)   Lipid Panel  No results found for: CHOL, TRIG, HDL, CHOLHDL, VLDL, LDLCALC, LDLDIRECT HEMOGLOBIN A1C No results found for: HGBA1C, MPG TSH No results for input(s): TSH in the last 8760 hours.  PRN Meds:.  Medications Discontinued During This Encounter  Medication Reason  . furosemide (LASIX) 20 MG tablet Dose change   Current Meds  Medication Sig  . carvedilol (COREG) 3.125 MG tablet TAKE 1 TABLET BY MOUTH TWICE DAILY WITH MEALS  . magnesium oxide (MAG-OX) 400 MG tablet Take 1 tablet (400 mg total) by mouth daily.  . sacubitril-valsartan (ENTRESTO) 97-103 MG Take 0.5 tablets by mouth 2 (two) times daily.  . [DISCONTINUED] furosemide (LASIX) 20 MG tablet TAKE 1 TABLET BY MOUTH DAILY    Cardiac Studies:   Echocardiogram 08/15/2017: Left ventricle cavity is severely dilated. Severe decrease in global wall motion. Doppler evidence of grade I (impaired) diastolic dysfunction, normal LAP. Left ventricle regional wall motion findings: No wall motion abnormalities. Calculated EF 13%. Left atrial cavity is severely dilated. Moderate (Grade II) aortic regurgitation. Moderate (Grade II) mitral regurgitation. Mild tricuspid regurgitation. Mild pulmonary hypertension. Estimated pulmonary artery systolic pressure 30 mmHg. Trace pulmonic regurgitation. Insignificant pericardial effusion. IVC is dilated with respiratory variation. Estimated RA pressure 8 mmHg. Compared to previous study on 08/07/2016, pericardial effusion is new. PASP is relatively lower.  Exercise sestamibi stress test 08/27/2014: 1. The resting electrocardiogram demonstrated normal sinus rhythm, incomplete LBBB and no resting arrhythmias. ST segment depression and T-wave inversion in inferior and lateral leads. Stress EKG is negative for ischemia. The patient performed treadmill exercise using a Bruce protocol, completing 6:26 minutes. Thre were occasional PVCs. The patient completed an estimated workload of 7.65 METS, 95% of the maximum predicted heart rate. The stress test was terminated because of dyspnea. 2. The LV is markedly dilated both at rest and stress images. The LV end diastolic volume was 782UM. Thre is anterior wall  thinning. There is no evidence of ischemia. Thre is severe generalized hypokinesis and LVEF markedly depressed at 11%. Findings are consistent with dilated cardiomyopathy. Clinical correlation is recommended.  Cardiac MRI 11/20/2017:  1. Severely dilated left ventricle with normal wall thickness and severely decreased systolic function (LVEF = 14%). There is severe diffuse hypokinesis and paradoxical septal motion consistent with LBBB. The ratio of non-compacted to compacted myocardium in the apical and mid portions of the ventricle is > 4:1. There is midwall late gadolinium enhancement in the basal anteroseptal and inferoseptal myocardium.   2. Normal right ventricular size, thickness and systolic function (LVEF = 55%). There are no regional wall motion  abnormalities.   3. Severely dilated left atrium (59 mm), normal right atrial size.   4. Moderately dilated pulmonary artery measuring 36 mm.   5. Moderate aortic and mitral regurgitation, mild tricuspid regurgitation.   6. Mild pericardial effusion.   Collectively, these findings are consistent with end stage non-compaction cardiomyopathy.  Assessment:   1. Chronic combined systolic and diastolic heart failure, NYHA class 2 ([place])   2. Biventricular implantable cardioverter-defibrillator (ICD) in situ   3. Dilated cardiomyopathy ([place])   4. CKD (chronic kidney disease) stage 3, GFR 30-59 ml/min ([facility])      Recommendations:   Patient is presently doing well, has had significant improvement in dyspnea and leg edema.  He has resumed regular exercise and tolerating this well.  He was noted to have nonsustained VT on pacemaker transmission and was started on magnesium oxide by Dr. Caryl Comes.  He will continue to follow-up with him regularly regarding this.  Has not had any recurrence of dizziness. I had hoped to increase his coreg; however, blood pressure is soft. He is asymptomatic in regards to this at this time and in view of his  heart failure, would like to continue with his Entresto and Coreg as tolerated.    He was noted to have slight worsening in kidney function on recent labs with Dr. Caryl Comes.  It appears that his baseline creatinine is from 1.2-1.5.  I suspect worsening in his kidney function related to aggressive diuresis.  I have encouraged him to take Lasix on a as needed basis to hopefully prevent any worsening kidney function and also in view of stable symptoms.  I will arrange for echocardiogram to be performed for follow-up.  I will see him back in 2 months or sooner if needed.  Miquel Dunn, MSN, APRN, FNP-C [facility]. [place] Office: [phone] Fax: [phone]

## 2018-06-19 ENCOUNTER — Other Ambulatory Visit: Payer: Self-pay | Admitting: Cardiology

## 2018-06-19 ENCOUNTER — Other Ambulatory Visit: Payer: Self-pay

## 2018-06-19 MED ORDER — CARVEDILOL 3.125 MG PO TABS: 3.1250 mg | ORAL_TABLET | Freq: Two times a day (BID) | ORAL | 1 refills | Status: DC

## 2018-07-30 ENCOUNTER — Ambulatory Visit (INDEPENDENT_AMBULATORY_CARE_PROVIDER_SITE_OTHER): Payer: Medicare Other

## 2018-07-30 ENCOUNTER — Other Ambulatory Visit: Payer: Self-pay

## 2018-07-30 DIAGNOSIS — I5042 Chronic combined systolic (congestive) and diastolic (congestive) heart failure: Secondary | ICD-10-CM | POA: Diagnosis not present

## 2018-07-30 DIAGNOSIS — I420 Dilated cardiomyopathy: Secondary | ICD-10-CM

## 2018-08-15 ENCOUNTER — Encounter: Payer: Self-pay | Admitting: Cardiology

## 2018-08-19 ENCOUNTER — Ambulatory Visit (INDEPENDENT_AMBULATORY_CARE_PROVIDER_SITE_OTHER): Payer: Medicare Other | Admitting: Cardiology

## 2018-08-19 ENCOUNTER — Encounter: Payer: Self-pay | Admitting: Cardiology

## 2018-08-19 ENCOUNTER — Other Ambulatory Visit: Payer: Self-pay

## 2018-08-19 VITALS — BP 115/63 | HR 55 | Ht 72.0 in | Wt 180.1 lb

## 2018-08-19 DIAGNOSIS — Z95810 Presence of automatic (implantable) cardiac defibrillator: Secondary | ICD-10-CM | POA: Diagnosis not present

## 2018-08-19 DIAGNOSIS — I428 Other cardiomyopathies: Secondary | ICD-10-CM

## 2018-08-19 DIAGNOSIS — N183 Chronic kidney disease, stage 3 unspecified: Secondary | ICD-10-CM

## 2018-08-19 DIAGNOSIS — I472 Ventricular tachycardia: Secondary | ICD-10-CM

## 2018-08-19 DIAGNOSIS — I5022 Chronic systolic (congestive) heart failure: Secondary | ICD-10-CM

## 2018-08-19 DIAGNOSIS — I4729 Other ventricular tachycardia: Secondary | ICD-10-CM

## 2018-08-19 NOTE — [place] Notes (Signed)
Primary Physician:  [name], MD   Patient ID: Miguel Patterson, male    DOB: 10/02/1945, 73 y.o.   MRN: [id number]  Subjective:    Chief Complaint  Patient presents with  . Congestive Heart Failure  . echo results  . Follow-up    2 mth    HPI: Miguel Patterson  is a 73 y.o. male  from [place]. He has severe non ischemic dilated cardiomyopathy with severely depressed left-ventricular systolic function, Ejection fraction has been in teens for past 3 years in spite of optimal medical therapy. 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 Underwent BIV ICD CRT insertion on 02/28/2018 with Dr. Caryl Comes for nonischemic cardiomyopathy.   Patient is on intermediate dose of Entresto. He had hyperkalemia with the highest dose of Entresto and thus it was decreased. He has renal insufficiency. He is not on spironolactone because of renal insufficiency and hyperkalemia.  He is here on 3 month office visit. Lasix was reduced to as needed dosing due to worsening renal function and overall improvement in symptoms. He continues to feel well and better than before. Weight has been stable and also his BP. He is exercising regularly. He is questioning if he should be on the higher dose.  No history of hypertension, diabetes or hypercholesterolemia. He does not smoke.  No history of MI. No history of thyroid problems. No history of TIA or CVA. Patient is from [place], he is not aware about having Chaga's disease at any time. There is concern for possible familial cardiomyopathy as one of his brothers also has an enlarged heart. 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  Past Medical History:  Diagnosis Date  . Cardiomegaly   . DCM (dilated cardiomyopathy) ([place])   . Fatigue   . LBBB (left bundle branch  block)   . Mild tricuspid regurgitation   . Moderate aortic regurgitation   . Moderate mitral regurgitation   . Pericardial effusion    INSIGNIFICANT  . S/P biventricular cardiac pacemaker procedure 02/28/18 with ICD MDT  03/01/2018  . Short of breath on exertion   . Systolic heart failure ([place])   . Trace pulmonic regurgitation by prior echocardiogram     Past Surgical History:  Procedure Laterality Date  . BIV ICD INSERTION CRT-D N/A 02/28/2018   Procedure: BIV ICD INSERTION CRT-D;  Surgeon: Deboraha Sprang, MD;  Location: [facility];  Service: Cardiovascular;  Laterality: N/A;  . LEG SURGERY Left 2000   DUE TO BROKE LEG    Social History   Socioeconomic History  . Marital status: Divorced    Spouse name: Not on file  . Number of children: 3  . Years of education: Not on file  . Highest education level: Not on file  Occupational History  . Not on file  Social Needs  . Financial resource strain: Not on file  . Food insecurity    Worry: Not on file    Inability: Not on file  . Transportation needs    Medical: Not on file    Non-medical: Not on file  Tobacco Use  . Smoking status: Never Smoker  . Smokeless tobacco: Never Used  Substance and Sexual Activity  . Alcohol use: No  . Drug use: No  . Sexual activity: Not on file  Lifestyle  .  Physical activity    Days per week: Not on file    Minutes per session: Not on file  . Stress: Not on file  Relationships  . Social Herbalist on phone: Not on file    Gets together: Not on file    Attends religious service: Not on file    Active member of club or organization: Not on file    Attends meetings of clubs or organizations: Not on file    Relationship status: Not on file  . Intimate partner violence    Fear of current or ex partner: Not on file    Emotionally abused: Not on file    Physically abused: Not on file    Forced sexual activity: Not on file  Other Topics Concern  . Not on file  Social  History Narrative  . Not on file    Review of Systems  Constitution: Negative for decreased appetite, malaise/fatigue, weight gain and weight loss.  Eyes: Negative for visual disturbance.  Cardiovascular: Negative for chest pain, dyspnea on exertion and leg swelling (improved slightly).  Respiratory: Negative for hemoptysis and wheezing.   Endocrine: Negative for cold intolerance and heat intolerance.  Skin: Negative for nail changes.  Musculoskeletal: Negative for myalgias.  Gastrointestinal: Negative for abdominal pain, nausea and vomiting.  Neurological: Negative for difficulty with concentration, dizziness, focal weakness and headaches.  Psychiatric/Behavioral: Negative for altered mental status and suicidal ideas.  All other systems reviewed and are negative.     Objective:  Blood pressure 115/63, pulse (!) 55, height 6' (1.829 m), weight 180 lb 1.6 oz (81.7 kg), SpO2 100 %. Body mass index is 24.43 kg/m.     Physical Exam  Constitutional: He is oriented to person, place, and time. Vital signs are normal. He appears well-developed and well-nourished.  HENT:  Head: Normocephalic and atraumatic.  Neck: Normal range of motion.  Cardiovascular: Normal rate, regular rhythm, normal heart sounds and intact distal pulses.  Pulmonary/Chest: Effort normal and breath sounds normal. No accessory muscle usage. No respiratory distress.  Abdominal: Soft. Bowel sounds are normal.  Musculoskeletal: Normal range of motion.  Neurological: He is alert and oriented to person, place, and time.  Skin: Skin is warm and dry.  Vitals reviewed.  Radiology: No results found.  Laboratory examination:    CMP Latest Ref Rng & Units 06/04/2018 03/01/2018 02/25/2018  Glucose 65 - 99 mg/dL 93 102(H) 81  BUN 8 - 27 mg/dL 27 19 25   Creatinine 0.76 - 1.27 mg/dL 1.50(H) 1.26(H) 1.38(H)  Sodium 134 - 144 mmol/L 144 141 145(H)  Potassium 3.5 - 5.2 mmol/L 4.6 4.1 4.9  Chloride 96 - 106 mmol/L 102 112(H) 106   CO2 20 - 29 mmol/L 24 20(L) 25  Calcium 8.6 - 10.2 mg/dL 9.7 8.8(L) 9.4   CBC Latest Ref Rng & Units 02/25/2018  WBC 3.4 - 10.8 x10E3/uL 5.2  Hemoglobin 13.0 - 17.7 g/dL 14.1  Hematocrit 37.5 - 51.0 % 43.3  Platelets 150 - 450 x10E3/uL 137(L)   Lipid Panel  No results found for: CHOL, TRIG, HDL, CHOLHDL, VLDL, LDLCALC, LDLDIRECT HEMOGLOBIN A1C No results found for: HGBA1C, MPG TSH No results for input(s): TSH in the last 8760 hours.  PRN Meds:. There are no discontinued medications. Current Meds  Medication Sig  . carvedilol (COREG) 3.125 MG tablet Take 1 tablet (3.125 mg total) by mouth 2 (two) times daily with a meal.  . furosemide (LASIX) 20 MG tablet Take 1 tablet (20  mg total) by mouth as needed (3 lb weight gain in 1 day, leg swelling, or shortness of breath).  . magnesium oxide (MAG-OX) 400 MG tablet Take 1 tablet (400 mg total) by mouth daily.  . sacubitril-valsartan (ENTRESTO) 97-103 MG Take 0.5 tablets by mouth 2 (two) times daily.    Cardiac Studies:   Echo- 07/30/2018 1. Severely depressed LV systolic function with EF 20%. Left ventricle cavity is severely dilated. Normal global wall motion. Doppler evidence of grade II (pseudonormal) diastolic dysfunction. Calculated EF 20%. 2. Left atrial cavity is severely dilated. 3. Moderate (Grade II) aortic regurgitation. Mild aortic valve leaflet thickening. 4. Moderate (Grade III) mitral regurgitation. 5. Moderate tricuspid regurgitation. Moderate pulmonary hypertension with approx. PA syst. pressure of 46 mm of Hg. 6. Small pericardial effusion. 7. IVC is dilated with respiratory variation. 8. c.f. echo. of 08/15/2017, PA pressure is higher, no other diagnostic change  Exercise sestamibi stress test 08/27/2014: 1. The resting electrocardiogram demonstrated normal sinus rhythm, incomplete LBBB and no resting arrhythmias. ST segment depression and T-wave inversion in inferior and lateral leads. Stress EKG is negative  for ischemia. The patient performed treadmill exercise using a Bruce protocol, completing 6:26 minutes. Thre were occasional PVCs. The patient completed an estimated workload of 7.65 METS, 95% of the maximum predicted heart rate. The stress test was terminated because of dyspnea. 2. The LV is markedly dilated both at rest and stress images. The LV end diastolic volume was 123456. Thre is anterior wall thinning. There is no evidence of ischemia. Thre is severe generalized hypokinesis and LVEF markedly depressed at 11%. Findings are consistent with dilated cardiomyopathy. Clinical correlation is recommended.  Cardiac MRI 11/20/2017:  1. Severely dilated left ventricle with normal wall thickness and severely decreased systolic function (LVEF = 14%). There is severe diffuse hypokinesis and paradoxical septal motion consistent with LBBB. The ratio of non-compacted to compacted myocardium in the apical and mid portions of the ventricle is > 4:1. There is midwall late gadolinium enhancement in the basal anteroseptal and inferoseptal myocardium.   2. Normal right ventricular size, thickness and systolic function (LVEF = 55%). There are no regional wall motion abnormalities.   3. Severely dilated left atrium (59 mm), normal right atrial size.   4. Moderately dilated pulmonary artery measuring 36 mm.   5. Moderate aortic and mitral regurgitation, mild tricuspid regurgitation.   6. Mild pericardial effusion.   Collectively, these findings are consistent with end stage non-compaction cardiomyopathy.  Assessment:   1. Congestive heart failure, NYHA class 1, chronic, systolic ([place])   2. Left ventricular non-compaction cardiomyopathy ([place])   3. CKD (chronic kidney disease) stage 3, GFR 30-59 ml/min ([facility])   4. NSVT (nonsustained ventricular tachycardia) ([place])   5. Biventricular implantable cardioverter-defibrillator (ICD) in situ    EKG 08/19/2018: Sinus bradycardia at 50 bpm, LBBB   Recommendations:   Patient is presently doing well, he denies any shortness of breath and only occasional minimal leg edema. Symptomatically remained stable and improved from before. Exercising regularly. Echo is essentially unchanged with stable LVEF around 20%. He is only using lasix as needed. CKD had recently worsened due to daily use of lasix, will follow up on this. Could potentially retry increasing his dose of Entresto if kidney function is permissible. Will increase his coreg up to 6.25 mg BID and hopefully continue to work to increase this further if BP allows. He will continue to monitor his BP at home. Could also consider addition of farxiga.   He  was noted to have NSVT on remote pacer check, started on Mag Ox by Dr. Caryl Comes. He denies any palpitations or heart racing. He will continue to follow up with Dr. Caryl Comes for management of this as well as monitoring of BIV-ICD.   I will notify him of lab results, and if we are able to make any changes to his Entresto. I will see him back in 3 months, unless Delene Loll is increased or he has any problems, will see him sooner.   Miguel Dunn, MSN, APRN, FNP-C [facility]. [place] Office: [phone] Fax: [phone]

## 2018-09-01 ENCOUNTER — Ambulatory Visit (INDEPENDENT_AMBULATORY_CARE_PROVIDER_SITE_OTHER): Payer: Medicare Other | Admitting: *Deleted

## 2018-09-01 DIAGNOSIS — I420 Dilated cardiomyopathy: Secondary | ICD-10-CM

## 2018-09-01 LAB — CUP PACEART REMOTE DEVICE CHECK
Battery Remaining Longevity: 120 mo
Battery Voltage: 3.07 V
Brady Statistic AP VP Percent: 30.75 %
Brady Statistic AP VS Percent: 0.89 %
Brady Statistic AS VP Percent: 66.19 %
Brady Statistic AS VS Percent: 2.16 %
Brady Statistic RA Percent Paced: 29.51 %
Brady Statistic RV Percent Paced: 0.94 %
Date Time Interrogation Session: 20200831052503
HighPow Impedance: 63 Ohm
Implantable Lead Implant Date: 20200228
Implantable Lead Implant Date: 20200228
Implantable Lead Implant Date: 20200228
Implantable Lead Location: 753858
Implantable Lead Location: 753859
Implantable Lead Location: 753860
Implantable Lead Model: 5076
Implantable Pulse Generator Implant Date: 20200228
Lead Channel Impedance Value: 201.488
Lead Channel Impedance Value: 201.488
Lead Channel Impedance Value: 211.891
Lead Channel Impedance Value: 228 Ohm
Lead Channel Impedance Value: 241.412
Lead Channel Impedance Value: 304 Ohm
Lead Channel Impedance Value: 361 Ohm
Lead Channel Impedance Value: 399 Ohm
Lead Channel Impedance Value: 456 Ohm
Lead Channel Impedance Value: 456 Ohm
Lead Channel Impedance Value: 475 Ohm
Lead Channel Impedance Value: 513 Ohm
Lead Channel Impedance Value: 703 Ohm
Lead Channel Impedance Value: 703 Ohm
Lead Channel Impedance Value: 760 Ohm
Lead Channel Impedance Value: 760 Ohm
Lead Channel Impedance Value: 817 Ohm
Lead Channel Impedance Value: 817 Ohm
Lead Channel Pacing Threshold Amplitude: 0.5 V
Lead Channel Pacing Threshold Amplitude: 1 V
Lead Channel Pacing Threshold Amplitude: 1.125 V
Lead Channel Pacing Threshold Pulse Width: 0.4 ms
Lead Channel Pacing Threshold Pulse Width: 0.4 ms
Lead Channel Pacing Threshold Pulse Width: 0.4 ms
Lead Channel Sensing Intrinsic Amplitude: 1.375 mV
Lead Channel Sensing Intrinsic Amplitude: 1.375 mV
Lead Channel Sensing Intrinsic Amplitude: 11.625 mV
Lead Channel Sensing Intrinsic Amplitude: 11.625 mV
Lead Channel Setting Pacing Amplitude: 1 V
Lead Channel Setting Pacing Amplitude: 1.75 V
Lead Channel Setting Pacing Amplitude: 2 V
Lead Channel Setting Pacing Pulse Width: 0.4 ms
Lead Channel Setting Pacing Pulse Width: 0.4 ms
Lead Channel Setting Sensing Sensitivity: 0.3 mV

## 2018-09-02 DIAGNOSIS — I5022 Chronic systolic (congestive) heart failure: Secondary | ICD-10-CM | POA: Diagnosis not present

## 2018-09-02 DIAGNOSIS — N183 Chronic kidney disease, stage 3 (moderate): Secondary | ICD-10-CM | POA: Diagnosis not present

## 2018-09-03 LAB — BASIC METABOLIC PANEL
BUN/Creatinine Ratio: 23 (ref 10–24)
BUN: 31 mg/dL — ABNORMAL HIGH (ref 8–27)
CO2: 23 mmol/L (ref 20–29)
Calcium: 9 mg/dL (ref 8.6–10.2)
Chloride: 112 mmol/L — ABNORMAL HIGH (ref 96–106)
Creatinine, Ser: 1.36 mg/dL — ABNORMAL HIGH (ref 0.76–1.27)
GFR calc Af Amer: 60 mL/min/{1.73_m2} (ref >59)
GFR calc non Af Amer: 52 mL/min/{1.73_m2} — ABNORMAL LOW (ref >59)
Glucose: 98 mg/dL (ref 65–99)
Potassium: 5.2 mmol/L (ref 3.5–5.2)
Sodium: 148 mmol/L — ABNORMAL HIGH (ref 134–144)

## 2018-09-09 ENCOUNTER — Encounter: Payer: Self-pay | Admitting: Cardiology

## 2018-09-09 ENCOUNTER — Ambulatory Visit (INDEPENDENT_AMBULATORY_CARE_PROVIDER_SITE_OTHER): Payer: Medicare Other | Admitting: Internal Medicine

## 2018-09-09 ENCOUNTER — Encounter: Payer: Self-pay | Admitting: Internal Medicine

## 2018-09-09 ENCOUNTER — Other Ambulatory Visit: Payer: Self-pay

## 2018-09-09 VITALS — BP 116/68 | HR 60 | Ht 72.0 in | Wt 184.2 lb

## 2018-09-09 DIAGNOSIS — I447 Left bundle-branch block, unspecified: Secondary | ICD-10-CM

## 2018-09-09 DIAGNOSIS — I4729 Other ventricular tachycardia: Secondary | ICD-10-CM

## 2018-09-09 DIAGNOSIS — I428 Other cardiomyopathies: Secondary | ICD-10-CM | POA: Diagnosis not present

## 2018-09-09 DIAGNOSIS — I472 Ventricular tachycardia: Secondary | ICD-10-CM | POA: Diagnosis not present

## 2018-09-09 DIAGNOSIS — Z95810 Presence of automatic (implantable) cardiac defibrillator: Secondary | ICD-10-CM | POA: Diagnosis not present

## 2018-09-09 DIAGNOSIS — I5022 Chronic systolic (congestive) heart failure: Secondary | ICD-10-CM | POA: Diagnosis not present

## 2018-09-09 LAB — CUP PACEART INCLINIC DEVICE CHECK
Brady Statistic RA Percent Paced: 19.5 %
Brady Statistic RV Percent Paced: 87.9 %
Date Time Interrogation Session: 20200908171714
Implantable Lead Implant Date: 20200228
Implantable Lead Implant Date: 20200228
Implantable Lead Implant Date: 20200228
Implantable Lead Location: 753858
Implantable Lead Location: 753859
Implantable Lead Location: 753860
Implantable Lead Model: 5076
Implantable Pulse Generator Implant Date: 20200228
Lead Channel Pacing Threshold Amplitude: 0.75 V
Lead Channel Pacing Threshold Amplitude: 0.75 V
Lead Channel Pacing Threshold Amplitude: 0.75 V
Lead Channel Pacing Threshold Pulse Width: 0.4 ms
Lead Channel Pacing Threshold Pulse Width: 0.4 ms
Lead Channel Pacing Threshold Pulse Width: 0.4 ms
Lead Channel Sensing Intrinsic Amplitude: 1.8 mV
Lead Channel Sensing Intrinsic Amplitude: 15.9 mV
Lead Channel Setting Pacing Amplitude: 1 V
Lead Channel Setting Pacing Amplitude: 1.75 V
Lead Channel Setting Pacing Amplitude: 2 V
Lead Channel Setting Pacing Pulse Width: 0.4 ms
Lead Channel Setting Pacing Pulse Width: 0.4 ms
Lead Channel Setting Sensing Sensitivity: 0.3 mV

## 2018-09-09 NOTE — Patient Instructions (Signed)
Medication Instructions:  Your physician has recommended you make the following change in your medication:   Hold your carvedilol for the next month. Call us and let us know how you are feeling.   Labwork: None ordered.  Testing/Procedures: None ordered.  Follow-Up: Your physician recommends that you schedule a follow-up appointment in: 9 months with Dr. Caryl Comes  Any Other Special Instructions Will Be Listed Below (If Applicable).     If you need a refill on your cardiac medications before your next appointment, please call your pharmacy.

## 2018-09-09 NOTE — Progress Notes (Signed)
Patient Care Team: [name], MD as PCP - General (Family Medicine) Adrian Prows, MD as PCP - Cardiology (Cardiology) Deboraha Sprang, MD as PCP - Electrophysiology (Cardiology)   HPI  Miguel Patterson is a 73 y.o. male seen in followup for CRT-D implanted for NICM with VT    Diagnosed 2016 when he presented with orthopnea and recumbent cough.  His EF was 11% with anterior wall thinning.  It was presumed nonischemic.  The patient denies chest pain, shortness of breath, nocturnal dyspnea, orthopnea or peripheral edema.  There have been no palpitations, lightheadedness or syncope.    He was told he had PVCs in the mid 20s.  His brother has a "enlarged heart"  DATE TEST EF   8/16 MYOVIEW   11 %   PVCs noted  8/18 Echo   13 % LAE severe MR Mod  8/19 Echo  13%   cMRI cMRI 14%  gadolinium enhancement basal septal non-compacted/compacted ratio 4: 1       Records and Results Reviewed   Past Medical History:  Diagnosis Date  . Cardiomegaly   . DCM (dilated cardiomyopathy) ([place])   . Fatigue   . LBBB (left bundle branch block)   . Mild tricuspid regurgitation   . Moderate aortic regurgitation   . Moderate mitral regurgitation   . Pericardial effusion    INSIGNIFICANT  . S/P biventricular cardiac pacemaker procedure 02/28/18 with ICD MDT  03/01/2018  . Short of breath on exertion   . Systolic heart failure ([place])   . Trace pulmonic regurgitation by prior echocardiogram     Past Surgical History:  Procedure Laterality Date  . BIV ICD INSERTION CRT-D N/A 02/28/2018   Procedure: BIV ICD INSERTION CRT-D;  Surgeon: Deboraha Sprang, MD;  Location: [facility];  Service: Cardiovascular;  Laterality: N/A;  . LEG SURGERY Left 2000   DUE TO BROKE LEG    Current Meds  Medication Sig  . carvedilol (COREG) 3.125 MG tablet Take 1 tablet (3.125 mg total) by mouth 2 (two) times daily with a meal.  . furosemide (LASIX) 20 MG tablet Take 1 tablet (20 mg total)  by mouth as needed (3 lb weight gain in 1 day, leg swelling, or shortness of breath).  . magnesium oxide (MAG-OX) 400 MG tablet Take 400 mg by mouth 2 (two) times daily.  . sacubitril-valsartan (ENTRESTO) 97-103 MG Take 0.5 tablets by mouth 2 (two) times daily.    Allergies  Allergen Reactions  . 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  Exercise tolerance is  good.  Frequent PVCs.  We will increase his atrial pacing rate from 50--60 to help with some degree of overdrive suppression.  I am not particularly sanguine.  His sleep disturbance which resulted in him urinating at night might be coming from his beta-blocker.  Given the low dose of carvedilol, I have asked that he hold it for about a month.  Alternative beta-blockers can be utilized if he gets better.  Euvolemic continue current meds

## 2018-09-09 NOTE — Progress Notes (Signed)
Remote ICD transmission.   

## 2018-09-12 DIAGNOSIS — M65331 Trigger finger, right middle finger: Secondary | ICD-10-CM | POA: Diagnosis not present

## 2018-09-12 DIAGNOSIS — Z0000 Encounter for general adult medical examination without abnormal findings: Secondary | ICD-10-CM | POA: Diagnosis not present

## 2018-09-12 DIAGNOSIS — N320 Bladder-neck obstruction: Secondary | ICD-10-CM | POA: Diagnosis not present

## 2018-09-12 DIAGNOSIS — M62838 Other muscle spasm: Secondary | ICD-10-CM | POA: Diagnosis not present

## 2018-09-16 NOTE — Progress Notes (Signed)
Lvm for pt to call back. 

## 2018-11-11 ENCOUNTER — Telehealth: Payer: Self-pay

## 2018-11-11 NOTE — Telephone Encounter (Signed)
Pt will be followed by his EP every nine months

## 2018-11-19 ENCOUNTER — Ambulatory Visit (INDEPENDENT_AMBULATORY_CARE_PROVIDER_SITE_OTHER): Payer: Medicare Other | Admitting: Cardiology

## 2018-11-19 ENCOUNTER — Other Ambulatory Visit: Payer: Self-pay

## 2018-11-19 ENCOUNTER — Encounter: Payer: Self-pay | Admitting: Cardiology

## 2018-11-19 VITALS — BP 101/55 | HR 68 | Temp 97.2°F | Ht 72.0 in | Wt 188.6 lb

## 2018-11-19 DIAGNOSIS — N1831 Chronic kidney disease, stage 3a: Secondary | ICD-10-CM

## 2018-11-19 DIAGNOSIS — Z95810 Presence of automatic (implantable) cardiac defibrillator: Secondary | ICD-10-CM | POA: Diagnosis not present

## 2018-11-19 DIAGNOSIS — I428 Other cardiomyopathies: Secondary | ICD-10-CM

## 2018-11-19 DIAGNOSIS — I5022 Chronic systolic (congestive) heart failure: Secondary | ICD-10-CM | POA: Diagnosis not present

## 2018-11-19 NOTE — Progress Notes (Signed)
Primary Physician:  [name], MD   Patient ID: Miguel Patterson, male    DOB: [birth year], 73 y.o.   MRN: [id number]  Subjective:    Chief Complaint  Patient presents with  . Congestive Heart Failure  . Follow-up    HPI: Miguel Patterson  is a 73 y.o. male  from [place]. He has severe non ischemic dilated cardiomyopathy with severely depressed left-ventricular systolic function, Ejection fraction has been in teens for past 3 years in spite of optimal medical therapy. 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 Underwent BIV ICD CRT insertion on 02/28/2018 with Dr. Caryl Comes for nonischemic cardiomyopathy.   Patient is on intermediate dose of Entresto. He had hyperkalemia with the highest dose of Entresto and thus it was decreased. He has renal insufficiency. He is not on spironolactone because of renal insufficiency and hyperkalemia.  He is here on 3 month office visit. He continues to do well. He is complaining of frequent urination at night, but is not happening nightly. He states that he was given a medication by PCP, but has not had any improvement. He is unsure of the medication. He continues to take Lasix daily. Weight is varying +/- 1 lb.  He is exercising regularly with walking 4 miles daily that he tolerates well.   No history of hypertension, diabetes or hypercholesterolemia. He does not smoke.  No history of MI. No history of thyroid problems. No history of TIA or CVA. Patient is from [place], he is not aware about having Chaga's disease at any time. There is concern for possible familial cardiomyopathy as one of his brothers also has an enlarged heart. 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  Past Medical History:  Diagnosis Date  . Cardiomegaly   . DCM (dilated  cardiomyopathy) ([place])   . Fatigue   . LBBB (left bundle branch block)   . Mild tricuspid regurgitation   . Moderate aortic regurgitation   . Moderate mitral regurgitation   . Pericardial effusion    INSIGNIFICANT  . S/P biventricular cardiac pacemaker procedure 02/28/18 with ICD MDT  03/01/2018  . Short of breath on exertion   . Systolic heart failure ([place])   . Trace pulmonic regurgitation by prior echocardiogram     Past Surgical History:  Procedure Laterality Date  . BIV ICD INSERTION CRT-D N/A 02/28/2018   Procedure: BIV ICD INSERTION CRT-D;  Surgeon: Deboraha Sprang, MD;  Location: [facility];  Service: Cardiovascular;  Laterality: N/A;  . LEG SURGERY Left 2000   DUE TO BROKE LEG    Social History   Socioeconomic History  . Marital status: Divorced    Spouse name: Not on file  . Number of children: 3  . Years of education: Not on file  . Highest education level: Not on file  Occupational History  . Not on file  Social Needs  . Financial resource strain: Not on file  . Food insecurity    Worry: Not on file    Inability: Not on file  . Transportation needs    Medical: Not on file    Non-medical: Not on file  Tobacco Use  . Smoking status: Never Smoker  . Smokeless tobacco: Never Used  Substance and Sexual Activity  . Alcohol use: No  . Drug use: No  .  Sexual activity: Not on file  Lifestyle  . Physical activity    Days per week: Not on file    Minutes per session: Not on file  . Stress: Not on file  Relationships  . Social Herbalist on phone: Not on file    Gets together: Not on file    Attends religious service: Not on file    Active member of club or organization: Not on file    Attends meetings of clubs or organizations: Not on file    Relationship status: Not on file  . Intimate partner violence    Fear of current or ex partner: Not on file    Emotionally abused: Not on file    Physically abused: Not on file    Forced sexual  activity: Not on file  Other Topics Concern  . Not on file  Social History Narrative  . Not on file    Review of Systems  Constitution: Negative for decreased appetite, malaise/fatigue, weight gain and weight loss.  Eyes: Negative for visual disturbance.  Cardiovascular: Negative for chest pain, dyspnea on exertion and leg swelling (improved slightly).  Respiratory: Negative for hemoptysis and wheezing.   Endocrine: Negative for cold intolerance and heat intolerance.  Skin: Negative for nail changes.  Musculoskeletal: Negative for myalgias.  Gastrointestinal: Negative for abdominal pain, nausea and vomiting.  Neurological: Negative for difficulty with concentration, dizziness, focal weakness and headaches.  Psychiatric/Behavioral: Negative for altered mental status and suicidal ideas.  All other systems reviewed and are negative.     Objective:  Blood pressure (!) 101/55, pulse 68, temperature (!) 97.2 F (36.2 C), height 6' (1.829 m), weight 188 lb 9.6 oz (85.5 kg), SpO2 99 %. Body mass index is 25.58 kg/m.     Physical Exam  Constitutional: He is oriented to person, place, and time. Vital signs are normal. He appears well-developed and well-nourished.  HENT:  Head: Normocephalic and atraumatic.  Neck: Normal range of motion.  Cardiovascular: Normal rate, regular rhythm, normal heart sounds and intact distal pulses.  Pulmonary/Chest: Effort normal and breath sounds normal. No accessory muscle usage. No respiratory distress.  Abdominal: Soft. Bowel sounds are normal.  Musculoskeletal: Normal range of motion.  Neurological: He is alert and oriented to person, place, and time.  Skin: Skin is warm and dry.  Vitals reviewed.  Radiology: No results found.  Laboratory examination:    CMP Latest Ref Rng & Units 09/02/2018 06/04/2018 03/01/2018  Glucose 65 - 99 mg/dL 98 93 102(H)  BUN 8 - 27 mg/dL 31(H) 27 19  Creatinine 0.76 - 1.27 mg/dL 1.36(H) 1.50(H) 1.26(H)  Sodium 134 - 144  mmol/L 148(H) 144 141  Potassium 3.5 - 5.2 mmol/L 5.2 4.6 4.1  Chloride 96 - 106 mmol/L 112(H) 102 112(H)  CO2 20 - 29 mmol/L 23 24 20(L)  Calcium 8.6 - 10.2 mg/dL 9.0 9.7 8.8(L)   CBC Latest Ref Rng & Units 02/25/2018  WBC 3.4 - 10.8 x10E3/uL 5.2  Hemoglobin 13.0 - 17.7 g/dL 14.1  Hematocrit 37.5 - 51.0 % 43.3  Platelets 150 - 450 x10E3/uL 137(L)   Lipid Panel  No results found for: CHOL, TRIG, HDL, CHOLHDL, VLDL, LDLCALC, LDLDIRECT HEMOGLOBIN A1C No results found for: HGBA1C, MPG TSH No results for input(s): TSH in the last 8760 hours.  PRN Meds:. There are no discontinued medications. Current Meds  Medication Sig  . carvedilol (COREG) 3.125 MG tablet Take 1 tablet (3.125 mg total) by mouth 2 (two) times  daily with a meal.  . furosemide (LASIX) 20 MG tablet Take 1 tablet (20 mg total) by mouth as needed (3 lb weight gain in 1 day, leg swelling, or shortness of breath).  . magnesium oxide (MAG-OX) 400 MG tablet Take 400 mg by mouth 2 (two) times daily.  . sacubitril-valsartan (ENTRESTO) 97-103 MG Take 0.5 tablets by mouth 2 (two) times daily.    Cardiac Studies:   Echo- 07/30/2018 1. Severely depressed LV systolic function with EF 20%. Left ventricle cavity is severely dilated. Normal global wall motion. Doppler evidence of grade II (pseudonormal) diastolic dysfunction. Calculated EF 20%. 2. Left atrial cavity is severely dilated. 3. Moderate (Grade II) aortic regurgitation. Mild aortic valve leaflet thickening. 4. Moderate (Grade III) mitral regurgitation. 5. Moderate tricuspid regurgitation. Moderate pulmonary hypertension with approx. PA syst. pressure of 46 mm of Hg. 6. Small pericardial effusion. 7. IVC is dilated with respiratory variation. 8. c.f. echo. of 08/15/2017, PA pressure is higher, no other diagnostic change  Exercise sestamibi stress test 08/27/2014: 1. The resting electrocardiogram demonstrated normal sinus rhythm, incomplete LBBB and no resting  arrhythmias. ST segment depression and T-wave inversion in inferior and lateral leads. Stress EKG is negative for ischemia. The patient performed treadmill exercise using a Bruce protocol, completing 6:26 minutes. Thre were occasional PVCs. The patient completed an estimated workload of 7.65 METS, 95% of the maximum predicted heart rate. The stress test was terminated because of dyspnea. 2. The LV is markedly dilated both at rest and stress images. The LV end diastolic volume was 123456. Thre is anterior wall thinning. There is no evidence of ischemia. Thre is severe generalized hypokinesis and LVEF markedly depressed at 11%. Findings are consistent with dilated cardiomyopathy. Clinical correlation is recommended.  Cardiac MRI 11/20/2017:  1. Severely dilated left ventricle with normal wall thickness and severely decreased systolic function (LVEF = 14%). There is severe diffuse hypokinesis and paradoxical septal motion consistent with LBBB. The ratio of non-compacted to compacted myocardium in the apical and mid portions of the ventricle is > 4:1. There is midwall late gadolinium enhancement in the basal anteroseptal and inferoseptal myocardium.   2. Normal right ventricular size, thickness and systolic function (LVEF = 55%). There are no regional wall motion abnormalities.   3. Severely dilated left atrium (59 mm), normal right atrial size.   4. Moderately dilated pulmonary artery measuring 36 mm.   5. Moderate aortic and mitral regurgitation, mild tricuspid regurgitation.   6. Mild pericardial effusion.   Collectively, these findings are consistent with end stage non-compaction cardiomyopathy.  Assessment:   1. Congestive heart failure, NYHA class 1, chronic, systolic ([place])   2. Nonischemic cardiomyopathy ([facility])   3. Stage 3a chronic kidney disease   4. Biventricular implantable cardioverter-defibrillator (ICD) in situ    EKG 08/19/2018: Sinus bradycardia at 50 bpm, LBBB   Recommendations:   Patient is here for 44-month office visit and follow-up for CHF.  He is presently doing well without any complaints today.  Weight has been overall stable with variation of 1 pound.  No shortness of breath or leg edema.  He is walking 4 miles per day that he tolerates well.  His blood pressure is borderline low today, would not further increase his Coreg at this time in view of this.  He is complaining of frequent urination at night.  As he is symptomatically doing well from a heart failure standpoint, we will have him cut his Lasix back to taking only a few days a  week.  If he notices any 3 pound weight gain in 1 day, later, or shortness of breath, he will again start taking Lasix on a daily basis.  This will also potentially help his kidney function, may consider repeating his BMP at his next visit if not already performed.  He continues to be followed by Dr. Caryl Comes from a EP standpoint.  I will see him back in 3 months, but encouraged him to contact sooner if needed.   Miquel Dunn, MSN, APRN, FNP-C [facility]. [place] Office: [phone] Fax: [phone]

## 2018-12-01 ENCOUNTER — Ambulatory Visit (INDEPENDENT_AMBULATORY_CARE_PROVIDER_SITE_OTHER): Payer: Medicare Other | Admitting: *Deleted

## 2018-12-01 DIAGNOSIS — I5042 Chronic combined systolic (congestive) and diastolic (congestive) heart failure: Secondary | ICD-10-CM | POA: Diagnosis not present

## 2018-12-01 LAB — CUP PACEART REMOTE DEVICE CHECK
Battery Remaining Longevity: 114 mo
Battery Voltage: 3.01 V
Brady Statistic AP VP Percent: 29.04 %
Brady Statistic AP VS Percent: 0.63 %
Brady Statistic AS VP Percent: 66.84 %
Brady Statistic AS VS Percent: 3.49 %
Brady Statistic RA Percent Paced: 26.83 %
Brady Statistic RV Percent Paced: 2.01 %
Date Time Interrogation Session: 20201130052705
HighPow Impedance: 62 Ohm
Implantable Lead Implant Date: 20200228
Implantable Lead Implant Date: 20200228
Implantable Lead Implant Date: 20200228
Implantable Lead Location: 753858
Implantable Lead Location: 753859
Implantable Lead Location: 753860
Implantable Lead Model: 5076
Implantable Pulse Generator Implant Date: 20200228
Lead Channel Impedance Value: 204.14 Ohm
Lead Channel Impedance Value: 204.14 Ohm
Lead Channel Impedance Value: 209 Ohm
Lead Channel Impedance Value: 216.848
Lead Channel Impedance Value: 222.34 Ohm
Lead Channel Impedance Value: 304 Ohm
Lead Channel Impedance Value: 399 Ohm
Lead Channel Impedance Value: 399 Ohm
Lead Channel Impedance Value: 418 Ohm
Lead Channel Impedance Value: 418 Ohm
Lead Channel Impedance Value: 456 Ohm
Lead Channel Impedance Value: 475 Ohm
Lead Channel Impedance Value: 703 Ohm
Lead Channel Impedance Value: 722 Ohm
Lead Channel Impedance Value: 722 Ohm
Lead Channel Impedance Value: 760 Ohm
Lead Channel Impedance Value: 779 Ohm
Lead Channel Impedance Value: 817 Ohm
Lead Channel Pacing Threshold Amplitude: 0.625 V
Lead Channel Pacing Threshold Amplitude: 0.875 V
Lead Channel Pacing Threshold Amplitude: 1.25 V
Lead Channel Pacing Threshold Pulse Width: 0.4 ms
Lead Channel Pacing Threshold Pulse Width: 0.4 ms
Lead Channel Pacing Threshold Pulse Width: 0.4 ms
Lead Channel Sensing Intrinsic Amplitude: 1.5 mV
Lead Channel Sensing Intrinsic Amplitude: 1.5 mV
Lead Channel Sensing Intrinsic Amplitude: 14.75 mV
Lead Channel Sensing Intrinsic Amplitude: 14.75 mV
Lead Channel Setting Pacing Amplitude: 1 V
Lead Channel Setting Pacing Amplitude: 1.75 V
Lead Channel Setting Pacing Amplitude: 2.25 V
Lead Channel Setting Pacing Pulse Width: 0.4 ms
Lead Channel Setting Pacing Pulse Width: 0.4 ms
Lead Channel Setting Sensing Sensitivity: 0.3 mV

## 2018-12-24 NOTE — Progress Notes (Signed)
ICD remote 

## 2019-02-23 ENCOUNTER — Other Ambulatory Visit: Payer: Self-pay

## 2019-02-23 ENCOUNTER — Ambulatory Visit: Payer: Self-pay | Admitting: Cardiology

## 2019-02-23 ENCOUNTER — Ambulatory Visit: Payer: Medicare Other | Admitting: Cardiology

## 2019-02-23 ENCOUNTER — Encounter: Payer: Self-pay | Admitting: Cardiology

## 2019-02-23 VITALS — BP 137/74 | HR 77 | Temp 98.2°F | Ht 72.0 in | Wt 174.4 lb

## 2019-02-23 DIAGNOSIS — I5042 Chronic combined systolic (congestive) and diastolic (congestive) heart failure: Secondary | ICD-10-CM | POA: Diagnosis not present

## 2019-02-23 DIAGNOSIS — Z95810 Presence of automatic (implantable) cardiac defibrillator: Secondary | ICD-10-CM

## 2019-02-23 DIAGNOSIS — N1831 Chronic kidney disease, stage 3a: Secondary | ICD-10-CM

## 2019-02-23 DIAGNOSIS — I428 Other cardiomyopathies: Secondary | ICD-10-CM

## 2019-02-23 MED ORDER — FUROSEMIDE 20 MG PO TABS: 20.0000 mg | ORAL_TABLET | ORAL | 3 refills | Status: DC | PRN

## 2019-02-23 NOTE — Progress Notes (Signed)
Primary Physician:  [name], MD   Patient ID: Miguel Patterson, male    DOB: [birth year], 74 y.o.   MRN: [id number]  Subjective:    Chief Complaint  Patient presents with  . Congestive Heart Failure  . Follow-up    3 month    HPI: Miguel Patterson  is a 74 y.o. male  from [place]. He has severe non ischemic dilated cardiomyopathy with severely depressed left-ventricular systolic function, Ejection fraction has been in teens for past 3 years in spite of optimal medical therapy. 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 Underwent BIV ICD CRT insertion on 02/28/2018 with Dr. Caryl Comes for nonischemic cardiomyopathy.   Patient is on intermediate dose of Entresto. He had hyperkalemia with the highest dose of Entresto and thus it was decreased. He has renal insufficiency. He is not on spironolactone because of renal insufficiency and hyperkalemia.  He is here on 3 month office visit. Doing well without any significant changes in symptoms.  Last remote ICD check in December 2020 showed stable histograms. Has noticed that with moving some branches in his yard, he had some dyspnea. He was previously walking 4 miles per day, but has not recently been exercising due to cold weather. Weight has been stable.  He is having some intermittent episodes of couple of fingers that go numb. No discoloration. He continues to have some frequent urination and is hoping to speak to his PCP about referral to urology.  No history of hypertension, diabetes or hypercholesterolemia. He does not smoke.  No history of MI. No history of thyroid problems. No history of TIA or CVA. Patient is from [place], he is not aware about having Chaga's disease at any time. There is concern for possible familial cardiomyopathy as one of his brothers also has an enlarged heart. He has  recently retired from teaching in view of pandemic and health concerns with returning to work.  Past Medical History:  Diagnosis Date  . Cardiomegaly   . DCM (dilated cardiomyopathy) ([place])   . Fatigue   . LBBB (left bundle branch block)   . Mild tricuspid regurgitation   . Moderate aortic regurgitation   . Moderate mitral regurgitation   . Pericardial effusion    INSIGNIFICANT  . S/P biventricular cardiac pacemaker procedure 02/28/18 with ICD MDT  03/01/2018  . Short of breath on exertion   . Systolic heart failure ([place])   . Trace pulmonic regurgitation by prior echocardiogram     Past Surgical History:  Procedure Laterality Date  . BIV ICD INSERTION CRT-D N/A 02/28/2018   Procedure: BIV ICD INSERTION CRT-D;  Surgeon: Deboraha Sprang, MD;  Location: [facility];  Service: Cardiovascular;  Laterality: N/A;  . LEG SURGERY Left 2000   DUE TO BROKE LEG    Social History   Socioeconomic History  . Marital status: Divorced    Spouse name: Not on file  . Number of children: 3  . Years of education: Not on file  . Highest education level: Not on file  Occupational History  . Not on file  Tobacco Use  . Smoking status: Never Smoker  . Smokeless tobacco: Never Used  Substance and Sexual Activity  . Alcohol use: No  . Drug use: No  . Sexual activity: Not on file  Other Topics Concern  . Not on file  Social History Narrative  .  Not on file   Social Determinants of Health   Financial Resource Strain:   . Difficulty of Paying Living Expenses: Not on file  Food Insecurity:   . Worried About Charity fundraiser in the Last Year: Not on file  . Ran Out of Food in the Last Year: Not on file  Transportation Needs:   . Lack of Transportation (Medical): Not on file  . Lack of Transportation (Non-Medical): Not on file  Physical Activity:   . Days of Exercise per Week: Not on file  . Minutes of Exercise per Session: Not on file  Stress:   . Feeling of Stress : Not on file    Social Connections:   . Frequency of Communication with Friends and Family: Not on file  . Frequency of Social Gatherings with Friends and Family: Not on file  . Attends Religious Services: Not on file  . Active Member of Clubs or Organizations: Not on file  . Attends Archivist Meetings: Not on file  . Marital Status: Not on file  Intimate Partner Violence:   . Fear of Current or Ex-Partner: Not on file  . Emotionally Abused: Not on file  . Physically Abused: Not on file  . Sexually Abused: Not on file    Review of Systems  Constitution: Negative for decreased appetite, malaise/fatigue, weight gain and weight loss.  Eyes: Negative for visual disturbance.  Cardiovascular: Negative for chest pain, dyspnea on exertion and leg swelling (improved slightly).  Respiratory: Negative for hemoptysis and wheezing.   Endocrine: Negative for cold intolerance and heat intolerance.  Skin: Negative for nail changes.  Musculoskeletal: Negative for myalgias.  Gastrointestinal: Negative for abdominal pain, nausea and vomiting.  Neurological: Negative for difficulty with concentration, dizziness, focal weakness and headaches.  Psychiatric/Behavioral: Negative for altered mental status and suicidal ideas.  All other systems reviewed and are negative.     Objective:  Blood pressure 137/74, pulse 77, temperature 98.2 F (36.8 C), height 6' (1.829 m), weight 174 lb 6.4 oz (79.1 kg), SpO2 98 %. Body mass index is 23.65 kg/m.     Physical Exam  Constitutional: He is oriented to person, place, and time. Vital signs are normal. He appears well-developed and well-nourished.  HENT:  Head: Normocephalic and atraumatic.  Cardiovascular: Normal rate, regular rhythm, normal heart sounds and intact distal pulses.  Pulses:      Radial pulses are 2+ on the left side.  Pulmonary/Chest: Effort normal and breath sounds normal. No accessory muscle usage. No respiratory distress.  Abdominal: Soft. Bowel  sounds are normal.  Musculoskeletal:        General: Normal range of motion.     Cervical back: Normal range of motion.  Neurological: He is alert and oriented to person, place, and time.  Skin: Skin is warm and dry.  Vitals reviewed.  Radiology: No results found.  Laboratory examination:    CMP Latest Ref Rng & Units 09/02/2018 06/04/2018 03/01/2018  Glucose 65 - 99 mg/dL 98 93 102(H)  BUN 8 - 27 mg/dL 31(H) 27 19  Creatinine 0.76 - 1.27 mg/dL 1.36(H) 1.50(H) 1.26(H)  Sodium 134 - 144 mmol/L 148(H) 144 141  Potassium 3.5 - 5.2 mmol/L 5.2 4.6 4.1  Chloride 96 - 106 mmol/L 112(H) 102 112(H)  CO2 20 - 29 mmol/L 23 24 20(L)  Calcium 8.6 - 10.2 mg/dL 9.0 9.7 8.8(L)   CBC Latest Ref Rng & Units 02/25/2018  WBC 3.4 - 10.8 x10E3/uL 5.2  Hemoglobin 13.0 -  17.7 g/dL 14.1  Hematocrit 37.5 - 51.0 % 43.3  Platelets 150 - 450 x10E3/uL 137(L)   Lipid Panel  No results found for: CHOL, TRIG, HDL, CHOLHDL, VLDL, LDLCALC, LDLDIRECT HEMOGLOBIN A1C No results found for: HGBA1C, MPG TSH No results for input(s): TSH in the last 8760 hours.  PRN Meds:. Medications Discontinued During This Encounter  Medication Reason  . furosemide (LASIX) 20 MG tablet Error   Current Meds  Medication Sig  . carvedilol (COREG) 3.125 MG tablet Take 1 tablet (3.125 mg total) by mouth 2 (two) times daily with a meal.  . magnesium oxide (MAG-OX) 400 MG tablet Take 400 mg by mouth 2 (two) times daily.  . sacubitril-valsartan (ENTRESTO) 97-103 MG Take 0.5 tablets by mouth 2 (two) times daily.    Cardiac Studies:   Echo- 07/30/2018 1. Severely depressed LV systolic function with EF 20%. Left ventricle cavity is severely dilated. Normal global wall motion. Doppler evidence of grade II (pseudonormal) diastolic dysfunction. Calculated EF 20%. 2. Left atrial cavity is severely dilated. 3. Moderate (Grade II) aortic regurgitation. Mild aortic valve leaflet thickening. 4. Moderate (Grade III) mitral  regurgitation. 5. Moderate tricuspid regurgitation. Moderate pulmonary hypertension with approx. PA syst. pressure of 46 mm of Hg. 6. Small pericardial effusion. 7. IVC is dilated with respiratory variation. 8. c.f. echo. of 08/15/2017, PA pressure is higher, no other diagnostic change  Exercise sestamibi stress test 08/27/2014: 1. The resting electrocardiogram demonstrated normal sinus rhythm, incomplete LBBB and no resting arrhythmias. ST segment depression and T-wave inversion in inferior and lateral leads. Stress EKG is negative for ischemia. The patient performed treadmill exercise using a Bruce protocol, completing 6:26 minutes. Thre were occasional PVCs. The patient completed an estimated workload of 7.65 METS, 95% of the maximum predicted heart rate. The stress test was terminated because of dyspnea. 2. The LV is markedly dilated both at rest and stress images. The LV end diastolic volume was 123456. Thre is anterior wall thinning. There is no evidence of ischemia. Thre is severe generalized hypokinesis and LVEF markedly depressed at 11%. Findings are consistent with dilated cardiomyopathy. Clinical correlation is recommended.  Cardiac MRI 11/20/2017:  1. Severely dilated left ventricle with normal wall thickness and severely decreased systolic function (LVEF = 14%). There is severe diffuse hypokinesis and paradoxical septal motion consistent with LBBB. The ratio of non-compacted to compacted myocardium in the apical and mid portions of the ventricle is > 4:1. There is midwall late gadolinium enhancement in the basal anteroseptal and inferoseptal myocardium.   2. Normal right ventricular size, thickness and systolic function (LVEF = 55%). There are no regional wall motion abnormalities.   3. Severely dilated left atrium (59 mm), normal right atrial size.   4. Moderately dilated pulmonary artery measuring 36 mm.   5. Moderate aortic and mitral regurgitation, mild  tricuspid regurgitation.   6. Mild pericardial effusion.   Collectively, these findings are consistent with end stage non-compaction cardiomyopathy.  Assessment:   1. Chronic combined systolic and diastolic heart failure, NYHA class 2 ([place])   2. Nonischemic cardiomyopathy (HCC)   3. Stage 3a chronic kidney disease   4. Biventricular implantable cardioverter-defibrillator (ICD) in situ    EKG 08/19/2018: Sinus bradycardia at 50 bpm, LBBB  Recommendations:   Miguel Patterson  is a 74 y.o. male  from [place]. He has severe non ischemic dilated cardiomyopathy with severely depressed left-ventricular systolic function, Ejection fraction has been in teens for past 3 years in spite of optimal medical therapy.  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 Underwent BIV ICD CRT insertion on 02/28/2018 with Dr. Caryl Comes for nonischemic cardiomyopathy.   Patient is doing well approximately 3 months ago.  I suspect that his intermittent finger numbness is related to neuromuscular as he has also had some issues with his trapezius muscle in the past, would recommend that he discuss with his PCP. He has noticed some intermittent dyspnea if he overexerts himself, continues to have class II symptoms.  Encouraged him to start back with his regular exercise as I suspect some deconditioning has contributed to this.  His weight has been stable and he has not noticed any worsening leg edema.  He will continue to use Lasix on a as needed basis.  He is on moderate dose Entresto in view of hyperkalemia and chronic kidney disease stage III. I will check BMP level for surveillance of kidney function.   He is also on low-dose Coreg in view of bradycardia.  He has had some PVCs on ICD transmissions that is being monitored by Dr. Caryl Comes.  Could consider potentially increasing his Coreg, but will  hold off in view that he is generally bradycardic.  He closely monitors his blood pressure and heart rate at home.  I will plan to see him back in 3 months, we will repeat his echocardiogram for continued surveillance of LVEF.  Encouraged him to contact sooner if needed.   Miquel Dunn, MSN, APRN, FNP-C [facility]. [place] Office: [phone] Fax: [phone]

## 2019-02-25 ENCOUNTER — Other Ambulatory Visit: Payer: Self-pay | Admitting: Cardiology

## 2019-02-25 DIAGNOSIS — N1831 Chronic kidney disease, stage 3a: Secondary | ICD-10-CM | POA: Diagnosis not present

## 2019-02-26 LAB — BASIC METABOLIC PANEL
BUN/Creatinine Ratio: 20 (ref 10–24)
BUN: 28 mg/dL — ABNORMAL HIGH (ref 8–27)
CO2: 24 mmol/L (ref 20–29)
Calcium: 9.4 mg/dL (ref 8.6–10.2)
Chloride: 107 mmol/L — ABNORMAL HIGH (ref 96–106)
Creatinine, Ser: 1.41 mg/dL — ABNORMAL HIGH (ref 0.76–1.27)
GFR calc Af Amer: 57 mL/min/{1.73_m2} — ABNORMAL LOW (ref >59)
GFR calc non Af Amer: 49 mL/min/{1.73_m2} — ABNORMAL LOW (ref >59)
Glucose: 93 mg/dL (ref 65–99)
Potassium: 5.5 mmol/L — ABNORMAL HIGH (ref 3.5–5.2)
Sodium: 145 mmol/L — ABNORMAL HIGH (ref 134–144)

## 2019-03-02 ENCOUNTER — Ambulatory Visit (INDEPENDENT_AMBULATORY_CARE_PROVIDER_SITE_OTHER): Payer: Medicare Other | Admitting: *Deleted

## 2019-03-02 DIAGNOSIS — I5042 Chronic combined systolic (congestive) and diastolic (congestive) heart failure: Secondary | ICD-10-CM | POA: Diagnosis not present

## 2019-03-02 LAB — CUP PACEART REMOTE DEVICE CHECK
Battery Remaining Longevity: 113 mo
Battery Voltage: 3 V
Brady Statistic AP VP Percent: 36.3 %
Brady Statistic AP VS Percent: 0.85 %
Brady Statistic AS VP Percent: 59.54 %
Brady Statistic AS VS Percent: 3.31 %
Brady Statistic RA Percent Paced: 34.28 %
Brady Statistic RV Percent Paced: 1.11 %
Date Time Interrogation Session: 20210301043823
HighPow Impedance: 56 Ohm
Implantable Lead Implant Date: 20200228
Implantable Lead Implant Date: 20200228
Implantable Lead Implant Date: 20200228
Implantable Lead Location: 753858
Implantable Lead Location: 753859
Implantable Lead Location: 753860
Implantable Lead Model: 5076
Implantable Pulse Generator Implant Date: 20200228
Lead Channel Impedance Value: 175.622
Lead Channel Impedance Value: 184.154
Lead Channel Impedance Value: 188.1 Ohm
Lead Channel Impedance Value: 189.525
Lead Channel Impedance Value: 193.707
Lead Channel Impedance Value: 304 Ohm
Lead Channel Impedance Value: 342 Ohm
Lead Channel Impedance Value: 361 Ohm
Lead Channel Impedance Value: 361 Ohm
Lead Channel Impedance Value: 399 Ohm
Lead Channel Impedance Value: 418 Ohm
Lead Channel Impedance Value: 475 Ohm
Lead Channel Impedance Value: 608 Ohm
Lead Channel Impedance Value: 646 Ohm
Lead Channel Impedance Value: 665 Ohm
Lead Channel Impedance Value: 665 Ohm
Lead Channel Impedance Value: 703 Ohm
Lead Channel Impedance Value: 703 Ohm
Lead Channel Pacing Threshold Amplitude: 0.625 V
Lead Channel Pacing Threshold Amplitude: 1.125 V
Lead Channel Pacing Threshold Amplitude: 1.125 V
Lead Channel Pacing Threshold Pulse Width: 0.4 ms
Lead Channel Pacing Threshold Pulse Width: 0.4 ms
Lead Channel Pacing Threshold Pulse Width: 0.4 ms
Lead Channel Sensing Intrinsic Amplitude: 1.125 mV
Lead Channel Sensing Intrinsic Amplitude: 1.125 mV
Lead Channel Sensing Intrinsic Amplitude: 12.5 mV
Lead Channel Sensing Intrinsic Amplitude: 12.5 mV
Lead Channel Setting Pacing Amplitude: 1 V
Lead Channel Setting Pacing Amplitude: 1.75 V
Lead Channel Setting Pacing Amplitude: 2.25 V
Lead Channel Setting Pacing Pulse Width: 0.4 ms
Lead Channel Setting Pacing Pulse Width: 0.4 ms
Lead Channel Setting Sensing Sensitivity: 0.3 mV

## 2019-03-02 NOTE — Progress Notes (Signed)
ICD Remote  

## 2019-03-05 ENCOUNTER — Ambulatory Visit: Payer: Self-pay

## 2019-03-05 ENCOUNTER — Other Ambulatory Visit: Payer: Self-pay

## 2019-03-05 DIAGNOSIS — I5042 Chronic combined systolic (congestive) and diastolic (congestive) heart failure: Secondary | ICD-10-CM

## 2019-03-05 NOTE — Progress Notes (Signed)
Called pt to inform him about his lab pt mention he was in office.

## 2019-03-11 ENCOUNTER — Telehealth: Payer: Self-pay

## 2019-03-11 NOTE — Telephone Encounter (Signed)
Spoke with patient this morning 03/11/2019 in regards to his echo results. Patient voiced understanding and states he feels like he needs a little more information about how/why his heart is around 20 percent. Patient also stated he feels "wonderful" and will continue back on Lasix. Please advise. Thanks!

## 2019-03-11 NOTE — Telephone Encounter (Signed)
This is where his heart function has been. We had hoped with the medication that it would improve, but hasn't. We can move up his appt with Dr. Einar Gip so that they can further discuss and look at his medications further. Better to do in person than over the phone.

## 2019-03-13 NOTE — Telephone Encounter (Signed)
Spoke with patient and transferred patient to front desk to schedule appt.

## 2019-03-16 DIAGNOSIS — N320 Bladder-neck obstruction: Secondary | ICD-10-CM | POA: Diagnosis not present

## 2019-03-16 DIAGNOSIS — N401 Enlarged prostate with lower urinary tract symptoms: Secondary | ICD-10-CM | POA: Diagnosis not present

## 2019-03-16 DIAGNOSIS — N138 Other obstructive and reflux uropathy: Secondary | ICD-10-CM | POA: Diagnosis not present

## 2019-03-16 DIAGNOSIS — N1831 Chronic kidney disease, stage 3a: Secondary | ICD-10-CM | POA: Diagnosis not present

## 2019-03-18 ENCOUNTER — Encounter: Payer: Self-pay | Admitting: Cardiology

## 2019-03-18 ENCOUNTER — Other Ambulatory Visit: Payer: Self-pay

## 2019-03-18 ENCOUNTER — Ambulatory Visit: Payer: Self-pay | Admitting: Cardiology

## 2019-03-18 VITALS — BP 98/55 | HR 60 | Temp 98.6°F | Resp 16 | Ht 72.0 in | Wt 177.2 lb

## 2019-03-18 DIAGNOSIS — N1831 Chronic kidney disease, stage 3a: Secondary | ICD-10-CM | POA: Diagnosis not present

## 2019-03-18 DIAGNOSIS — I428 Other cardiomyopathies: Secondary | ICD-10-CM | POA: Diagnosis not present

## 2019-03-18 DIAGNOSIS — I5042 Chronic combined systolic (congestive) and diastolic (congestive) heart failure: Secondary | ICD-10-CM

## 2019-03-18 DIAGNOSIS — R7989 Other specified abnormal findings of blood chemistry: Secondary | ICD-10-CM

## 2019-03-18 DIAGNOSIS — Z950 Presence of cardiac pacemaker: Secondary | ICD-10-CM

## 2019-03-18 NOTE — Progress Notes (Addendum)
 Primary Physician:  Wilson, Fred H, MD   Patient ID: Miguel Patterson, male    DOB: 02/09/1945, 73 y.o.   MRN: [id number]  Subjective:    Chief Complaint  Patient presents with  . chronic combined systolic and diastolic heart failure  . Results    Echocardiogram  . Follow-up    6 month    HPI: Miguel Patterson  is a 73 y.o. male  from [place]. He has severe non ischemic dilated cardiomyopathy with severely depressed left-ventricular systolic function, Ejection fraction has been in teens for past 3 years in spite of optimal medical therapy. 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 Underwent BIV ICD CRT insertion on 02/28/2018 with Dr. Klein for nonischemic cardiomyopathy.   Patient is on max dose of Entresto. Previously, he has had hyperkalemia with the highest dose of Entresto. He has renal insufficiency. He is not on spironolactone because of renal insufficiency and hyperkalemia. He is here on 3 month office visit. Doing well without any significant changes in symptoms. He remains active. Notices dyspnea with exertion and states it is non limiting and has been taking lasix BID.   Past Medical History:  Diagnosis Date  . Cardiomegaly   . DCM (dilated cardiomyopathy) (H[facility])   . Fatigue   . LBBB (left bundle branch block)   . Mild tricuspid regurgitation   . Moderate aortic regurgitation   . Moderate mitral regurgitation   . Pericardial effusion    INSIGNIFICANT  . S/P biventricular cardiac pacemaker procedure 02/28/18 with ICD MDT  03/01/2018  . Short of breath on exertion   . Systolic heart failure (HCC)   . Trace pulmonic regurgitation by prior echocardiogram     Past Surgical History:  Procedure Laterality Date  . BIV ICD INSERTION CRT-D N/A 02/28/2018   Procedure: BIV ICD INSERTION CRT-D;  Surgeon: Klein, Steven C, MD;  Location: [facility];  Service: Cardiovascular;  Laterality: N/A;  . LEG SURGERY Left 2000   DUE TO BROKE LEG   Social History   Tobacco Use  . Smoking status: Never Smoker  . Smokeless tobacco: Never Used  Substance Use Topics  . Alcohol use: No     Review of Systems  Cardiovascular: Positive for dyspnea on exertion (occasional). Negative for chest pain and leg swelling.  Gastrointestinal: Negative for melena.  Genitourinary: Positive for frequency and hesitancy.   Objective:  Blood pressure (!) 98/55, pulse 60, temperature 98.6 F (37 C), temperature source Temporal, resp. rate 16, height 6' (1.829 m), weight 177 lb 3.2 oz (80.4 kg), SpO2 97 %. Body mass index is 24.03 kg/m.  Vitals with BMI 03/18/2019 02/23/2019 11/19/2018  Height 6' 0" 6' 0" 6' 0"  Weight 177 lbs 3 oz 174 lbs 6 oz 188 lbs 10 oz  BMI 24.03 23.65 25.57  Systolic 98 137 101  Diastolic 55 74 55  Pulse 60 77 68     Physical Exam  Constitutional: Vital signs are normal. He appears well-developed and well-nourished.  Cardiovascular: Normal rate, regular rhythm, normal heart sounds, intact distal pulses and normal pulses.  NO JVD, No leg edema.  Pulmonary/Chest: Effort normal and breath sounds normal. No accessory muscle usage. No respiratory distress.  Abdominal: Soft. Bowel sounds are normal.  Vitals reviewed.  Radiology: No results found.  Laboratory examination:   CMP Latest Ref Rng & Units 03/20/2019 02/25/2019 09/02/2018    Glucose 65 - 99 mg/dL 100(H) 93 98  BUN 8 - 27 mg/dL 30(H) 28(H) 31(H)  Creatinine 0.76 - 1.27 mg/dL 1.45(H) 1.41(H) 1.36(H)  Sodium 134 - 144 mmol/L 145(H) 145(H) 148(H)  Potassium 3.5 - 5.2 mmol/L 5.1 5.5(H) 5.2  Chloride 96 - 106 mmol/L 107(H) 107(H) 112(H)  CO2 20 - 29 mmol/L _0 Calcium 8.6 - 10.2 mg/dL 9.4 9.4 9.0  Total Protein 6.0 - 8.5 g/dL 7.1 - -  Total Bilirubin 0.0 - 1.2 mg/dL 0.7 - -  Alkaline Phos 39 - 117 IU/L 67 - -  AST 0 - 40 IU/L 16 - -  ALT 0 - 44 IU/L 14 - -    CBC Latest Ref Rng & Units 02/25/2018  WBC 3.4 - 10.8 x10E3/uL 5.2  Hemoglobin 13.0 - 17.7 g/dL 14.1  Hematocrit 37.5 - 51.0 % 43.3  Platelets 150 - 450 x10E3/uL 137(L)   Lipid Panel     Component Value Date/Time   CHOL 179 03/20/2019 0804   TRIG 61 03/20/2019 0804   HDL 51 03/20/2019 0804   LDLCALC 116 (H) 03/20/2019 0804   HEMOGLOBIN A1C No results found for: HGBA1C, MPG TSH Recent Labs    03/20/19 0804  TSH 4.720*    BNP (last 3 results) Recent Labs    03/20/19 0804  BNP 934.8*   PRN Meds:. There are no discontinued medications. Current Meds  Medication Sig  . carvedilol (COREG) 3.125 MG tablet Take 1 tablet (3.125 mg total) by mouth 2 (two) times daily with a meal.  . furosemide (LASIX) 20 MG tablet Take 1 tablet (20 mg total) by mouth as needed (3 lb weight gain in 1 day, leg swelling, or shortness of breath). (Patient taking differently: Take 20 mg by mouth 2 (two) times daily. )  . magnesium oxide (MAG-OX) 400 MG tablet Take 400 mg by mouth 2 (two) times daily.  . sacubitril-valsartan (ENTRESTO) 97-103 MG Take 0.5 tablets by mouth 2 (two) times daily.  . tamsulosin (FLOMAX) 0.4 MG CAPS capsule Take by mouth.    Cardiac Studies:   Exercise sestamibi stress test 08/27/2014: 1. The resting electrocardiogram demonstrated normal sinus rhythm, incomplete LBBB and no resting arrhythmias. ST segment depression and T-wave inversion in inferior and lateral leads. Stress EKG is negative for ischemia. The patient performed treadmill exercise using a Bruce protocol, completing 6:26 minutes. Thre were occasional PVCs. The patient completed an estimated workload of 7.65 METS, 95% of the maximum predicted heart rate. The stress test was terminated because of dyspnea. 2. The LV is markedly dilated both at rest and stress images. The LV end diastolic volume was 366YQ. Thre is anterior wall thinning. There is no evidence of ischemia. Thre is severe generalized hypokinesis and LVEF  markedly depressed at 11%. Findings are consistent with dilated cardiomyopathy. Clinical correlation is recommended.  Cardiac MRI 11/20/2017: 1. Severely dilated left ventricle with normal wall thickness and severely decreased systolic function (LVEF = 14%). There is severe diffuse hypokinesis and paradoxical septal motion consistent with LBBB. The ratio of non-compacted to compacted myocardium in the apical and mid portions of the ventricle is > 4:1. There is midwall late gadolinium enhancement in the basal anteroseptal and inferoseptal myocardium.   2. Normal right ventricular size, thickness and systolic function (LVEF = 55%). There are no regional wall motion abnormalities.   3. Severely dilated left atrium (59 mm), normal right atrial size.   4. Moderately dilated pulmonary artery measuring 36 mm.   5.  Moderate aortic and mitral regurgitation, mild tricuspid regurgitation.   6. Mild pericardial effusion.   Collectively, these findings are consistent with end stage non-compaction cardiomyopathy.  Echocardiogram 03/05/2019:  Severely depressed LV systolic function with visual EF <20%. Severe global  hypokinesis Left ventricle cavity is dilated. Dilated cardiomyopathy.  Doppler evidence of grade III (restrictive) diastolic dysfunction,  elevated LAP. Calculated EF 11%.  Left atrial cavity is severely dilated. Interatrial septum bulges to the  right suggests elevated left atrial pressure.  Moderate aortic regurgitation.  Moderate to severe mitral regurgitation.  Moderate tricuspid regurgitation. Moderate pulmonary hypertension. RVSP  measures 59 mmHg.  Mild pulmonic regurgitation.  Moderate size pericardial effusion with no hemodynamic compromise.  IVC is dilated with a respiratory response of <50%.  Prior study dated 07/2018, 08/15/2017, no significant change.  EKG:  EKG 03/18/1019: Probably AV paced rhythm with first-degree AV block, biventricular pacemaker detected.  No  further analysis.   Single PVC.  Assessment:     ICD-10-CM   1. Chronic combined systolic and diastolic heart failure, NYHA class 2 (HCC)  I50.42 EKG 12-Lead    CMP14+EGFR    TSH    Lipid Panel With LDL/HDL Ratio    Brain natriuretic peptide    Brain natriuretic peptide    Brain natriuretic peptide  2. Nonischemic cardiomyopathy (HCC)  I42.8   3. S/P biventricular cardiac pacemaker procedure 02/28/18 with ICD MDT   Z95.0   4. Stage 3a chronic kidney disease  N18.31      Recommendations:   Miguel Patterson  is a 73 y.o. male  from [place]. He has severe non ischemic dilated cardiomyopathy with severely depressed left-ventricular systolic function, Ejection fraction has been in teens for past 3 years in spite of optimal medical therapy. 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 Underwent BIV ICD CRT insertion on 02/28/2018 with Dr. Klein for nonischemic cardiomyopathy.   He has noticed some intermittent dyspnea if he overexerts himself, continues to have class II symptoms.  His weight has been stable and he has not noticed any  leg edema.  He is presently taking Lasix twice daily, blood pressure is soft, I will repeat CMP, BNP, TSH and also lipid profile testing.  We will consider reducing the Lasix to once a day.  Today he is not in any acute decompensated heart failure.  I will request Dr. Steven Klein to release his ICD monitoring to me for close monitoring of his heart failure symptoms.  Although ejection fraction has not improved, he is on guideline directed medical therapy and he is presently doing well and well compensated.  We will continue to monitor his renal failure closely.   I will plan to see him back in 6 weeks with lab and CHF f/u..  Encouraged him to contact sooner if needed.  Addendum: I have reviewed the results of the CMP, BNP, TSH and  lipids with the patient.  Patient does have mild hyperlipidemia and I will discuss this on his next office visit.  I will also trend his BMP by repeating labs prior to his next office visit in 5 to 6 weeks, will also obtain thyroid function test.  Jay Ganji, MD, FACC 03/21/2019, 5:49 PM [facility] [place] Office: [phone]  

## 2019-03-20 ENCOUNTER — Telehealth: Payer: Self-pay | Admitting: *Deleted

## 2019-03-20 DIAGNOSIS — I5042 Chronic combined systolic (congestive) and diastolic (congestive) heart failure: Secondary | ICD-10-CM | POA: Diagnosis not present

## 2019-03-20 NOTE — Telephone Encounter (Signed)
-----   Message from Adrian Prows, MD sent at 03/18/2019 11:47 AM EDT ----- Regarding: ICD Miguel Patterson, can you please release the ICD for [place] to monitor. Patient preference and it is easier for me to manage CHF. Right now I am blind to it.  Ms C, please put the patient in our device clinic  [place]

## 2019-03-20 NOTE — Telephone Encounter (Signed)
LMOVM requesting call back from patient. Will confirm that patient wishes to have Dr. Einar Gip follow [place] monitoring. Direct DC number and office hours provided.

## 2019-03-21 LAB — LIPID PANEL WITH LDL/HDL RATIO
Cholesterol, Total: 179 mg/dL (ref 100–199)
HDL: 51 mg/dL (ref >39)
LDL Chol Calc (NIH): 116 mg/dL — ABNORMAL HIGH (ref 0–99)
LDL/HDL Ratio: 2.3 ratio (ref 0.0–3.6)
Triglycerides: 61 mg/dL (ref 0–149)
VLDL Cholesterol Cal: 12 mg/dL (ref 5–40)

## 2019-03-21 LAB — CMP14+EGFR
ALT: 14 IU/L (ref 0–44)
AST: 16 IU/L (ref 0–40)
Albumin/Globulin Ratio: 1.7 (ref 1.2–2.2)
Albumin: 4.5 g/dL (ref 3.7–4.7)
Alkaline Phosphatase: 67 IU/L (ref 39–117)
BUN/Creatinine Ratio: 21 (ref 10–24)
BUN: 30 mg/dL — ABNORMAL HIGH (ref 8–27)
Bilirubin Total: 0.7 mg/dL (ref 0.0–1.2)
CO2: 23 mmol/L (ref 20–29)
Calcium: 9.4 mg/dL (ref 8.6–10.2)
Chloride: 107 mmol/L — ABNORMAL HIGH (ref 96–106)
Creatinine, Ser: 1.45 mg/dL — ABNORMAL HIGH (ref 0.76–1.27)
GFR calc Af Amer: 55 mL/min/{1.73_m2} — ABNORMAL LOW (ref >59)
GFR calc non Af Amer: 47 mL/min/{1.73_m2} — ABNORMAL LOW (ref >59)
Globulin, Total: 2.6 g/dL (ref 1.5–4.5)
Glucose: 100 mg/dL — ABNORMAL HIGH (ref 65–99)
Potassium: 5.1 mmol/L (ref 3.5–5.2)
Sodium: 145 mmol/L — ABNORMAL HIGH (ref 134–144)
Total Protein: 7.1 g/dL (ref 6.0–8.5)

## 2019-03-21 LAB — BRAIN NATRIURETIC PEPTIDE: BNP: 934.8 pg/mL — ABNORMAL HIGH (ref 0.0–100.0)

## 2019-03-21 LAB — TSH: TSH: 4.72 u[IU]/mL — ABNORMAL HIGH (ref 0.450–4.500)

## 2019-03-21 NOTE — Addendum Note (Signed)
Addended by: Kela Millin on: 03/21/2019 05:49 PM   Modules accepted: Orders

## 2019-03-21 NOTE — Addendum Note (Signed)
Addended by: Kela Millin on: 03/21/2019 05:51 PM   Modules accepted: Orders

## 2019-03-24 DIAGNOSIS — Z23 Encounter for immunization: Secondary | ICD-10-CM | POA: Diagnosis not present

## 2019-03-30 NOTE — Telephone Encounter (Signed)
Spoke with patient. Confirmed that he wishes to have his device transmissions followed by Dr. Einar Gip for now. Pt aware he can transfer remote monitoring back if he wishes to in the future. Pt agreeable to appointment with Dr. Caryl Comes on 06/02/19 at 3:00pm per recall. Pt denies additional questions or concerns at this time.

## 2019-03-31 ENCOUNTER — Other Ambulatory Visit: Payer: Self-pay

## 2019-03-31 MED ORDER — ENTRESTO 97-103 MG PO TABS: 0.5000 | ORAL_TABLET | Freq: Two times a day (BID) | ORAL | 3 refills | Status: DC

## 2019-04-03 ENCOUNTER — Other Ambulatory Visit: Payer: Self-pay | Admitting: Cardiology

## 2019-04-03 ENCOUNTER — Encounter: Payer: Self-pay | Admitting: Cardiology

## 2019-04-03 DIAGNOSIS — Z4502 Encounter for adjustment and management of automatic implantable cardiac defibrillator: Secondary | ICD-10-CM | POA: Insufficient documentation

## 2019-04-03 HISTORY — DX: Encounter for adjustment and management of automatic implantable cardiac defibrillator: Z45.02

## 2019-04-06 ENCOUNTER — Other Ambulatory Visit: Payer: Self-pay | Admitting: Cardiology

## 2019-04-07 ENCOUNTER — Telehealth: Payer: Self-pay

## 2019-04-07 NOTE — Telephone Encounter (Signed)
Pt assistance for entresto faxed to [employer]

## 2019-04-13 DIAGNOSIS — N138 Other obstructive and reflux uropathy: Secondary | ICD-10-CM | POA: Diagnosis not present

## 2019-04-13 DIAGNOSIS — N401 Enlarged prostate with lower urinary tract symptoms: Secondary | ICD-10-CM | POA: Diagnosis not present

## 2019-04-20 DIAGNOSIS — N1831 Chronic kidney disease, stage 3a: Secondary | ICD-10-CM | POA: Diagnosis not present

## 2019-04-20 DIAGNOSIS — N401 Enlarged prostate with lower urinary tract symptoms: Secondary | ICD-10-CM | POA: Diagnosis not present

## 2019-04-20 DIAGNOSIS — I5022 Chronic systolic (congestive) heart failure: Secondary | ICD-10-CM | POA: Diagnosis not present

## 2019-04-20 DIAGNOSIS — N138 Other obstructive and reflux uropathy: Secondary | ICD-10-CM | POA: Diagnosis not present

## 2019-04-21 DIAGNOSIS — Z23 Encounter for immunization: Secondary | ICD-10-CM | POA: Diagnosis not present

## 2019-04-24 DIAGNOSIS — I5042 Chronic combined systolic (congestive) and diastolic (congestive) heart failure: Secondary | ICD-10-CM | POA: Diagnosis not present

## 2019-04-24 DIAGNOSIS — R7989 Other specified abnormal findings of blood chemistry: Secondary | ICD-10-CM | POA: Diagnosis not present

## 2019-04-24 DIAGNOSIS — R5383 Other fatigue: Secondary | ICD-10-CM | POA: Diagnosis not present

## 2019-04-25 LAB — TSH+T4F+T3FREE
Free T4: 1.06 ng/dL (ref 0.82–1.77)
T3, Free: 2 pg/mL (ref 2.0–4.4)
TSH: 3.36 u[IU]/mL (ref 0.450–4.500)

## 2019-04-25 LAB — BRAIN NATRIURETIC PEPTIDE: BNP: 1455.6 pg/mL — ABNORMAL HIGH (ref 0.0–100.0)

## 2019-04-27 DIAGNOSIS — N1831 Chronic kidney disease, stage 3a: Secondary | ICD-10-CM | POA: Diagnosis not present

## 2019-04-27 DIAGNOSIS — N138 Other obstructive and reflux uropathy: Secondary | ICD-10-CM | POA: Diagnosis not present

## 2019-04-27 DIAGNOSIS — N401 Enlarged prostate with lower urinary tract symptoms: Secondary | ICD-10-CM | POA: Diagnosis not present

## 2019-04-29 NOTE — Progress Notes (Signed)
Primary Physician:  [name], MD   Patient ID: Miguel Patterson, male    DOB: [birth year], 74 y.o.   MRN: [id number]   Chief Complaint  Patient presents with  . Congestive Heart Failure  . Follow-up    6 week  . Results    labs   HPI    Miguel Patterson  is a 75 y.o. male  from [place]. He has severe non ischemic dilated cardiomyopathy with severely depressed left-ventricular systolic function, Ejection fraction has been in teens for past 3 years in spite of optimal medical therapy. 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 Underwent BIV ICD CRT insertion on 02/28/2018 with Dr. Caryl Comes for nonischemic cardiomyopathy.   Over the past 2 to 3 weeks is noticed marked worsening dyspnea, states that he has not been able to exercise without having to stop due to dyspnea and overall feeling poorly.  No PND or orthopnea.  He also feels the Lasix is not working.  No chest pain, dizziness or syncope.  Past Medical History:  Diagnosis Date  . Cardiomegaly   . Chronic combined systolic and diastolic heart failure ([place]) 02/10/2018  . DCM (dilated cardiomyopathy) ([place])   . Encounter for adjustment of biventricular implantable cardioverter-defibrillator (ICD) 04/03/2019  . Fatigue   . ICD- Biventricular Medtronic ICD Claria Mri [id number] in situ 02/28/18 03/01/2018  . LBBB (left bundle branch block)   . Mild tricuspid regurgitation   . Moderate aortic regurgitation   . Moderate mitral regurgitation   . Pericardial effusion    INSIGNIFICANT  . S/P biventricular cardiac pacemaker procedure 02/28/18 with ICD MDT  03/01/2018  . Short of breath on exertion   . Systolic heart failure ([place])   . Trace pulmonic regurgitation by prior echocardiogram     Past Surgical History:  Procedure Laterality Date  . BIV ICD INSERTION CRT-D N/A 02/28/2018   Procedure: BIV ICD  INSERTION CRT-D;  Surgeon: Deboraha Sprang, MD;  Location: [facility];  Service: Cardiovascular;  Laterality: N/A;  . LEG SURGERY Left 2000   DUE TO BROKE LEG   Social History   Tobacco Use  . Smoking status: Never Smoker  . Smokeless tobacco: Never Used  Substance Use Topics  . Alcohol use: No    Marital Status: Divorced  ROS:   Review of Systems  Constitution: Positive for malaise/fatigue.  Cardiovascular: Positive for dyspnea on exertion and leg swelling. Negative for chest pain.  Gastrointestinal: Negative for melena.  Genitourinary: Positive for frequency and hesitancy.   Objective:  Blood pressure (!) 104/59, pulse (!) 58, temperature (!) 96.5 F (35.8 C), temperature source Temporal, resp. rate 17, height 6' (1.829 m), weight 171 lb 3.2 oz (77.7 kg), SpO2 98 %. Body mass index is 23.22 kg/m.  Vitals with BMI 05/01/2019 03/18/2019 02/23/2019  Height 6\' 0"  6\' 0"  6\' 0"   Weight 171 lbs 3 oz 177 lbs 3 oz 174 lbs 6 oz  BMI 23.21 63.78 58.85  Systolic 027 98 741  Diastolic 59 55 74  Pulse 58 60 77     Physical Exam  Constitutional: Vital signs are normal. He appears well-developed and well-nourished.  Cardiovascular: Normal rate, regular rhythm, normal heart sounds, intact distal pulses and normal pulses.  NO JVD, No leg edema.  Pulmonary/Chest: Effort normal and breath sounds normal. No accessory muscle usage. No respiratory distress.  Abdominal: Soft.  Bowel sounds are normal.  Vitals reviewed.  Laboratory examination:   CMP Latest Ref Rng & Units 03/20/2019 02/25/2019 09/02/2018  Glucose 65 - 99 mg/dL 100(H) 93 98  BUN 8 - 27 mg/dL 30(H) 28(H) 31(H)  Creatinine 0.76 - 1.27 mg/dL 1.45(H) 1.41(H) 1.36(H)  Sodium 134 - 144 mmol/L 145(H) 145(H) 148(H)  Potassium 3.5 - 5.2 mmol/L 5.1 5.5(H) 5.2  Chloride 96 - 106 mmol/L 107(H) 107(H) 112(H)  CO2 20 - 29 mmol/L 23 24 23   Calcium 8.6 - 10.2 mg/dL 9.4 9.4 9.0  Total Protein 6.0 - 8.5 g/dL 7.1 - -  Total Bilirubin 0.0  - 1.2 mg/dL 0.7 - -  Alkaline Phos 39 - 117 IU/L 67 - -  AST 0 - 40 IU/L 16 - -  ALT 0 - 44 IU/L 14 - -   CBC Latest Ref Rng & Units 02/25/2018  WBC 3.4 - 10.8 x10E3/uL 5.2  Hemoglobin 13.0 - 17.7 g/dL 14.1  Hematocrit 37.5 - 51.0 % 43.3  Platelets 150 - 450 x10E3/uL 137(L)   Lipid Panel     Component Value Date/Time   CHOL 179 03/20/2019 0804   TRIG 61 03/20/2019 0804   HDL 51 03/20/2019 0804   LDLCALC 116 (H) 03/20/2019 0804   HEMOGLOBIN A1C No results found for: HGBA1C, MPG TSH Recent Labs    03/20/19 0804 04/24/19 0932  TSH 4.720* 3.360    BNP (last 3 results) Recent Labs    03/20/19 0804 04/24/19 0933  BNP 934.8* 1,455.6*   PRN Meds:. Medications Discontinued During This Encounter  Medication Reason  . furosemide (LASIX) 20 MG tablet Ineffective   Current Meds  Medication Sig  . carvedilol (COREG) 3.125 MG tablet TAKE 1 TABLET BY MOUTH TWICE DAILY WITH MEALS  . magnesium oxide (MAG-OX) 400 MG tablet Take 400 mg by mouth 2 (two) times daily.  . sacubitril-valsartan (ENTRESTO) 97-103 MG Take 0.5 tablets by mouth 2 (two) times daily.  . tamsulosin (FLOMAX) 0.4 MG CAPS capsule Take by mouth.  . [DISCONTINUED] furosemide (LASIX) 20 MG tablet TAKE 1 TABLET BY MOUTH DAILY    Radiology:   No results found.  Cardiac Studies:   Exercise sestamibi stress test 08/27/2014: 1. The resting electrocardiogram demonstrated normal sinus rhythm, incomplete LBBB and no resting arrhythmias. ST segment depression and T-wave inversion in inferior and lateral leads. Stress EKG is negative for ischemia. The patient performed treadmill exercise using a Bruce protocol, completing 6:26 minutes. Thre were occasional PVCs. The patient completed an estimated workload of 7.65 METS, 95% of the maximum predicted heart rate. The stress test was terminated because of dyspnea. 2. The LV is markedly dilated both at rest and stress images. The LV end diastolic volume was 419FX. Thre is anterior  wall thinning. There is no evidence of ischemia. Thre is severe generalized hypokinesis and LVEF markedly depressed at 11%. Findings are consistent with dilated cardiomyopathy. Clinical correlation is recommended.  Cardiac MRI 11/20/2017: 1. Severely dilated left ventricle with normal wall thickness and severely decreased systolic function (LVEF = 14%). There is severe diffuse hypokinesis and paradoxical septal motion consistent with LBBB. The ratio of non-compacted to compacted myocardium in the apical and mid portions of the ventricle is > 4:1. There is midwall late gadolinium enhancement in the basal anteroseptal and inferoseptal myocardium. 2. Normal right ventricular size, thickness and systolic function (LVEF = 55%). There are no regional wall motion abnormalities. 3. Severely dilated left atrium (59 mm), normal right atrial size. 4. Moderately dilated pulmonary  artery measuring 36 mm. 5. Moderate aortic and mitral regurgitation, mild tricuspid regurgitation. 6. Mild pericardial effusion. Collectively, these findings are consistent with end stage non-compaction cardiomyopathy.  Echocardiogram 03/05/2019:  Severely depressed LV systolic function with visual EF <20%. Severe global  hypokinesis Left ventricle cavity is dilated. Dilated cardiomyopathy.  Doppler evidence of grade III (restrictive) diastolic dysfunction, elevated LAP. Calculated EF 11%.  Left atrial cavity is severely dilated. Interatrial septum bulges to the  right suggests elevated left atrial pressure.  Moderate aortic regurgitation.  Moderate to severe mitral regurgitation.  Moderate tricuspid regurgitation. Moderate pulmonary hypertension. RVSP  measures 59 mmHg.  Mild pulmonic regurgitation.  Moderate size pericardial effusion with no hemodynamic compromise.  IVC is dilated with a respiratory response of <50%.  Prior study dated 07/2018, 08/15/2017, no significant change.  EKG:  03/18/1019: Probably AV paced rhythm  with first-degree AV block, biventricular pacemaker detected.  No further analysis. Single PVC.  Assessment:     ICD-10-CM   1. Acute on chronic systolic heart failure (HCC)  I50.23 torsemide (DEMADEX) 20 MG tablet    Brain natriuretic peptide    Basic metabolic panel    Basic metabolic panel    Brain natriuretic peptide    Pulse oximetry, overnight  2. Congestive heart failure, NYHA class 1, chronic, systolic (HCC)  A45.36   3. Nonischemic cardiomyopathy (HCC)  I42.8   4. S/P biventricular cardiac pacemaker procedure 02/28/18 with ICD MDT   Z95.0   5. Stage 3a chronic kidney disease  N18.31     Recommendations:   Miguel Patterson  is a 74 y.o. male  from [place]. He has severe non ischemic dilated cardiomyopathy with severely depressed left-ventricular systolic function, Ejection fraction has been in teens for past 3 years in spite of optimal medical therapy. 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 Underwent BIV ICD CRT insertion on 02/28/2018 with Dr. Caryl Comes for nonischemic cardiomyopathy.   I have reviewed the results of the echocardiogram, BNP and also BMP. Renal function has remained stable but BNP is clearly risen, he now has class III-IV dyspnea and also leg edema. I will discontinue furosemide and switch him to torsemide, he'll start with 1 tablet twice daily for about a week and once his dyspnea and leg edema is resolved, he can switch it back to once a. I would like to repeat BMP and BNP in 2 weeks in order to see him back in 3 weeks for close monitoring. I advised him not to exercise for now in view of acute decompensated heart failure.  Patient is on max tolerated dose of Entresto, has had hyperkalemia with the highest dose of Entresto. He has renal insufficiency. He is not on spironolactone because of renal insufficiency and hyperkalemia.  Due to  dyspnea and acute decompensated heart failure, I would also like to check nocturnal oximetry and evaluate him for oxygen needs at night. His lipids need to be addressed at some time.  He is not interested to participate in Heart Fid study to evaluate iron infusion in CHF patients.   Reviewed his labs, thyroid is normal.   Adrian Prows, MD, [facility] 05/02/2019, 2:10 PM [facility]. [place] Office: [phone]

## 2019-05-01 ENCOUNTER — Encounter: Payer: Self-pay | Admitting: Cardiology

## 2019-05-01 ENCOUNTER — Ambulatory Visit: Payer: Self-pay | Admitting: Cardiology

## 2019-05-01 ENCOUNTER — Other Ambulatory Visit: Payer: Self-pay

## 2019-05-01 VITALS — BP 104/59 | HR 58 | Temp 96.5°F | Resp 17 | Ht 72.0 in | Wt 171.2 lb

## 2019-05-01 DIAGNOSIS — I428 Other cardiomyopathies: Secondary | ICD-10-CM | POA: Diagnosis not present

## 2019-05-01 DIAGNOSIS — N1831 Chronic kidney disease, stage 3a: Secondary | ICD-10-CM

## 2019-05-01 DIAGNOSIS — I5023 Acute on chronic systolic (congestive) heart failure: Secondary | ICD-10-CM | POA: Diagnosis not present

## 2019-05-01 DIAGNOSIS — Z950 Presence of cardiac pacemaker: Secondary | ICD-10-CM

## 2019-05-01 MED ORDER — TORSEMIDE 20 MG PO TABS: 20.0000 mg | ORAL_TABLET | ORAL | 2 refills | Status: DC

## 2019-05-08 NOTE — Telephone Encounter (Signed)
Please review. Thanks!  

## 2019-05-10 ENCOUNTER — Encounter: Payer: Self-pay | Admitting: Cardiology

## 2019-05-15 NOTE — Telephone Encounter (Signed)
From patient.

## 2019-05-18 DIAGNOSIS — I5023 Acute on chronic systolic (congestive) heart failure: Secondary | ICD-10-CM | POA: Diagnosis not present

## 2019-05-19 LAB — BASIC METABOLIC PANEL
BUN/Creatinine Ratio: 28 — ABNORMAL HIGH (ref 10–24)
BUN: 48 mg/dL — ABNORMAL HIGH (ref 8–27)
CO2: 25 mmol/L (ref 20–29)
Calcium: 9.2 mg/dL (ref 8.6–10.2)
Chloride: 100 mmol/L (ref 96–106)
Creatinine, Ser: 1.72 mg/dL — ABNORMAL HIGH (ref 0.76–1.27)
GFR calc Af Amer: 45 mL/min/{1.73_m2} — ABNORMAL LOW (ref >59)
GFR calc non Af Amer: 39 mL/min/{1.73_m2} — ABNORMAL LOW (ref >59)
Glucose: 86 mg/dL (ref 65–99)
Potassium: 4.1 mmol/L (ref 3.5–5.2)
Sodium: 141 mmol/L (ref 134–144)

## 2019-05-19 LAB — BRAIN NATRIURETIC PEPTIDE: BNP: 1026.4 pg/mL — ABNORMAL HIGH (ref 0.0–100.0)

## 2019-05-20 NOTE — Telephone Encounter (Signed)
Per pt please read

## 2019-05-22 DIAGNOSIS — I509 Heart failure, unspecified: Secondary | ICD-10-CM | POA: Diagnosis not present

## 2019-05-25 ENCOUNTER — Ambulatory Visit: Payer: Medicare Other | Admitting: Cardiology

## 2019-05-26 DIAGNOSIS — I5042 Chronic combined systolic (congestive) and diastolic (congestive) heart failure: Secondary | ICD-10-CM

## 2019-05-26 DIAGNOSIS — R0609 Other forms of dyspnea: Secondary | ICD-10-CM

## 2019-05-26 DIAGNOSIS — R5383 Other fatigue: Secondary | ICD-10-CM

## 2019-05-26 DIAGNOSIS — G4734 Idiopathic sleep related nonobstructive alveolar hypoventilation: Secondary | ICD-10-CM

## 2019-05-28 NOTE — Progress Notes (Signed)
Nocturnal oximetry 05/20/2019: SpO2 <88%:  53 minutes SpO2 <89%:  77 minutes Highest SPO2 99% and lowest SPO2 76%.  Oxygen desaturation events 225, desaturation index 25. Impression: Patient qualifies for nocturnal oxygen supplementation per Medicare guidelines Group 1.    ICD-10-CM   1. Chronic combined systolic and diastolic heart failure, NYHA class 2 (HCC)  I50.42 For home use only DME oxygen    Ambulatory referral to Sleep Studies  2. Dyspnea on exertion  R06.00 For home use only DME oxygen    Ambulatory referral to Sleep Studies  3. Nocturnal hypoxemia  G47.34 For home use only DME oxygen    Ambulatory referral to Sleep Studies  4. Malaise and fatigue  R53.81 Ambulatory referral to Sleep Studies   R53.83     Adrian Prows, MD, [facility] 05/28/2019, 11:47 PM [facility]. [place] Office: [phone]

## 2019-05-28 NOTE — Progress Notes (Signed)
Primary Physician:  [name], MD   Patient ID: Miguel Patterson, male    DOB: [birth year], 74 y.o.   MRN: [id number]   Chief Complaint  Patient presents with  . Follow-up    3 week  . Congestive Heart Failure   HPI    Miguel Patterson  is a 74 y.o. male  from [place]. Miguel Patterson has severe non ischemic dilated cardiomyopathy with severely depressed left-ventricular systolic function, Ejection fraction has been in teens for past 3 years in spite of optimal medical therapy. 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 Underwent BIV ICD CRT insertion on 02/28/2018 with Dr. Caryl Comes for nonischemic cardiomyopathy.   I had seen Miguel Patterson 3 weeks ago with marked worsening dyspnea, dizziness and hypotension. Reduce the dose of Entresto from moderate dose to low-dose. Miguel Patterson has brought symptoms are improved significantly, dyspnea has essentially resolved or back to baseline with mild dyspnea and Miguel Patterson has walked about 4 miles about 3 days ago. Dizziness still persist but mild. No chest pain.  Past Medical History:  Diagnosis Date  . Cardiomegaly   . Chronic combined systolic and diastolic heart failure ([place]) 02/10/2018  . DCM (dilated cardiomyopathy) ([place])   . Encounter for adjustment of biventricular implantable cardioverter-defibrillator (ICD) 04/03/2019  . Fatigue   . ICD- Biventricular Medtronic ICD Claria Mri [id number] in situ 02/28/18 03/01/2018  . LBBB (left bundle branch block)   . Mild tricuspid regurgitation   . Moderate aortic regurgitation   . Moderate mitral regurgitation   . Pericardial effusion    INSIGNIFICANT  . S/P biventricular cardiac pacemaker procedure 02/28/18 with ICD MDT  03/01/2018  . Short of breath on exertion   . Systolic heart failure ([place])   . Trace pulmonic regurgitation by prior echocardiogram     Past Surgical History:  Procedure Laterality  Date  . BIV ICD INSERTION CRT-D N/A 02/28/2018   Procedure: BIV ICD INSERTION CRT-D;  Surgeon: Deboraha Sprang, MD;  Location: [facility];  Service: Cardiovascular;  Laterality: N/A;  . LEG SURGERY Left 2000   DUE TO BROKE LEG   Family History  Problem Relation Age of Onset  . Hypertension Mother 62  . Thyroid disease Mother   . Healthy Father 59    Social History   Tobacco Use  . Smoking status: Never Smoker  . Smokeless tobacco: Never Used  Substance Use Topics  . Alcohol use: No    Marital Status: Divorced  ROS:   Review of Systems  Constitution: Positive for malaise/fatigue.  Cardiovascular: Positive for dyspnea on exertion. Negative for chest pain and leg swelling.  Gastrointestinal: Negative for melena.  Genitourinary: Positive for frequency and hesitancy.   Objective:  Blood pressure (!) 98/54, pulse (!) 52, resp. rate 15, height 6' (1.829 m), weight 174 lb (78.9 kg), SpO2 98 %. Body mass index is 23.6 kg/m.  Vitals with BMI 05/29/2019 05/01/2019 03/18/2019  Height 6\' 0"  6\' 0"  6\' 0"   Weight 174 lbs 171 lbs 3 oz 177 lbs 3 oz  BMI 23.59 83.15 17.61  Systolic 98 607 98  Diastolic 54 59 55  Pulse 52 58 60     Physical Exam  Constitutional: Vital signs are normal. Miguel Patterson appears well-developed and well-nourished.  Cardiovascular: Normal rate, regular rhythm, normal heart sounds, intact distal pulses and normal pulses.  NO JVD, No leg edema.  Pulmonary/Chest: Effort  normal and breath sounds normal. No accessory muscle usage. No respiratory distress.  Abdominal: Soft. Bowel sounds are normal.  Vitals reviewed.  Laboratory examination:   CMP Latest Ref Rng & Units 05/18/2019 03/20/2019 02/25/2019  Glucose 65 - 99 mg/dL 86 100(H) 93  BUN 8 - 27 mg/dL 48(H) 30(H) 28(H)  Creatinine 0.76 - 1.27 mg/dL 1.72(H) 1.45(H) 1.41(H)  Sodium 134 - 144 mmol/L 141 145(H) 145(H)  Potassium 3.5 - 5.2 mmol/L 4.1 5.1 5.5(H)  Chloride 96 - 106 mmol/L 100 107(H) 107(H)  CO2 20 - 29  mmol/L 25 23 24   Calcium 8.6 - 10.2 mg/dL 9.2 9.4 9.4  Total Protein 6.0 - 8.5 g/dL - 7.1 -  Total Bilirubin 0.0 - 1.2 mg/dL - 0.7 -  Alkaline Phos 39 - 117 IU/L - 67 -  AST 0 - 40 IU/L - 16 -  ALT 0 - 44 IU/L - 14 -   CBC Latest Ref Rng & Units 02/25/2018  WBC 3.4 - 10.8 x10E3/uL 5.2  Hemoglobin 13.0 - 17.7 g/dL 14.1  Hematocrit 37.5 - 51.0 % 43.3  Platelets 150 - 450 x10E3/uL 137(L)   Lipid Panel     Component Value Date/Time   CHOL 179 03/20/2019 0804   TRIG 61 03/20/2019 0804   HDL 51 03/20/2019 0804   LDLCALC 116 (H) 03/20/2019 0804   HEMOGLOBIN A1C No results found for: HGBA1C, MPG TSH Recent Labs    03/20/19 0804 04/24/19 0932  TSH 4.720* 3.360    BNP (last 3 results) Recent Labs    03/20/19 0804 04/24/19 0933 05/18/19 0943  BNP 934.8* 1,455.6* 1,026.4*   PRN Meds:. Medications Discontinued During This Encounter  Medication Reason  . sacubitril-valsartan (ENTRESTO) 97-103 MG Error  . sacubitril-valsartan (ENTRESTO) 24-26 MG Reorder   Current Meds  Medication Sig  . carvedilol (COREG) 3.125 MG tablet TAKE 1 TABLET BY MOUTH TWICE DAILY WITH MEALS  . magnesium oxide (MAG-OX) 400 MG tablet Take 400 mg by mouth 2 (two) times daily.  . sacubitril-valsartan (ENTRESTO) 24-26 MG Take 1 tablet by mouth 2 (two) times daily.  . tamsulosin (FLOMAX) 0.4 MG CAPS capsule Take by mouth.  . torsemide (DEMADEX) 20 MG tablet Take 1 tablet (20 mg total) by mouth as directed. Twice daily as directed  . [DISCONTINUED] sacubitril-valsartan (ENTRESTO) 24-26 MG Take 1 tablet by mouth 2 (two) times daily.    Radiology:   No results found.  Cardiac Studies:   Exercise sestamibi stress test 08/27/2014: 1. The resting electrocardiogram demonstrated normal sinus rhythm, incomplete LBBB and no resting arrhythmias. ST segment depression and T-wave inversion in inferior and lateral leads. Stress EKG is negative for ischemia. The patient performed treadmill exercise using a Bruce  protocol, completing 6:26 minutes. Thre were occasional PVCs. The patient completed an estimated workload of 7.65 METS, 95% of the maximum predicted heart rate. The stress test was terminated because of dyspnea. 2. The LV is markedly dilated both at rest and stress images. The LV end diastolic volume was 454UJ. Thre is anterior wall thinning. There is no evidence of ischemia. Thre is severe generalized hypokinesis and LVEF markedly depressed at 11%. Findings are consistent with dilated cardiomyopathy. Clinical correlation is recommended.  Cardiac MRI 11/20/2017: 1. Severely dilated left ventricle with normal wall thickness and severely decreased systolic function (LVEF = 14%). There is severe diffuse hypokinesis and paradoxical septal motion consistent with LBBB. The ratio of non-compacted to compacted myocardium in the apical and mid portions of the ventricle is > 4:1.  There is midwall late gadolinium enhancement in the basal anteroseptal and inferoseptal myocardium. 2. Normal right ventricular size, thickness and systolic function (LVEF = 55%). There are no regional wall motion abnormalities. 3. Severely dilated left atrium (59 mm), normal right atrial size. 4. Moderately dilated pulmonary artery measuring 36 mm. 5. Moderate aortic and mitral regurgitation, mild tricuspid regurgitation. 6. Mild pericardial effusion. Collectively, these findings are consistent with end stage non-compaction cardiomyopathy.  Echocardiogram 03/05/2019:  Severely depressed LV systolic function with visual EF <20%. Severe global  hypokinesis Left ventricle cavity is dilated. Dilated cardiomyopathy.  Doppler evidence of grade III (restrictive) diastolic dysfunction, elevated LAP. Calculated EF 11%.  Left atrial cavity is severely dilated. Interatrial septum bulges to the  right suggests elevated left atrial pressure.  Moderate aortic regurgitation.  Moderate to severe mitral regurgitation.  Moderate tricuspid  regurgitation. Moderate pulmonary hypertension. RVSP  measures 59 mmHg.  Mild pulmonic regurgitation.  Moderate size pericardial effusion with no hemodynamic compromise.  IVC is dilated with a respiratory response of <50%.  Prior study dated 07/2018, 08/15/2017, no significant change.  Nocturnal oximetry 05/20/2019: SpO2 <88%:  53 minutes SpO2 <89%:  77 minutes Highest SPO2 99% and lowest SPO2 76%.  Oxygen desaturation events 225, desaturation index 25. Impression: Patient qualifies for nocturnal oxygen supplementation per Medicare guidelines Group 1.  EKG:  03/18/1019: Probably AV paced rhythm with first-degree AV block, biventricular pacemaker detected.  No further analysis. Single PVC.  Assessment:     ICD-10-CM   1. Chronic systolic heart failure (HCC)  I50.22 sacubitril-valsartan (ENTRESTO) 24-26 MG    Brain natriuretic peptide  2. Left ventricular non-compaction cardiomyopathy (HCC)  I42.8   3. Nonischemic cardiomyopathy (HCC)  I42.8   4. ICD- Biventricular Medtronic ICD Claria Mri [id number] in situ 02/28/18  Z95.810   5. Nocturnal hypoxemia  G47.34   6. Stage 3a chronic kidney disease  H82.99 Basic metabolic panel    Meds ordered this encounter  Medications  . sacubitril-valsartan (ENTRESTO) 24-26 MG    Sig: Take 1 tablet by mouth 2 (two) times daily.    Dispense:  180 tablet    Refill:  1   Medications Discontinued During This Encounter  Medication Reason  . sacubitril-valsartan (ENTRESTO) 97-103 MG Error  . sacubitril-valsartan (ENTRESTO) 24-26 MG Reorder     Recommendations:   BERNARDINO DOWELL  is a 74 y.o. male  from [place]. Miguel Patterson has severe non ischemic dilated cardiomyopathy with severely depressed left-ventricular systolic function, Ejection fraction has been in teens for past 3 years in spite of optimal medical therapy. Cardiac MRI on 11/20/2017 with severely dilated LV with normal wall thickness and decreased LVEF of 14% with findings consistent with end  stage non-compaction cardiomyopathy. Underwent BIV ICD CRT insertion on 02/28/2018 with Dr. Caryl Comes for nonischemic cardiomyopathy.   I seen Miguel Patterson 3 weeks ago with acute decompensated systolic heart failure.  I increased his furosemide transiently and also held his Entresto and finally after Miguel Patterson felt better, blood pressure improved, I have started Miguel Patterson back on low-dose at 24/26 mg.  Miguel Patterson was on maximum dose Entresto and also had developed hyperkalemia.  Miguel Patterson is now tolerating low-dose and blood pressure is stable but still around 37J systolic. Today there is no clinical evidence of congestive heart failure, leg edema has completely resolved, no JVD.  Advised Miguel Patterson to increase his activity slowly, I also reviewed the results of the nocturnal oximetry and referral has been made for nocturnal oxygen supplementation and also for sleep study to  exclude central sleep apnea in view of congestive heart failure.  I will repeat BMP and BNP to reevaluate for baseline as Miguel Patterson is stable now.  Miguel Patterson has an appointment to see Dr. Caryl Comes for follow-up of his ICD.  His ICD has been transferred to our clinic and I will follow up on his heart failure on a monthly basis.  This was a 40-minute office visit encounter.  Adrian Prows, MD, [facility] 05/29/2019, 10:09 AM [facility] [place] Pager: [phone] Office: [phone]

## 2019-05-28 NOTE — Progress Notes (Signed)
Orders for sleep study and oxygen supplementation done.  Miguel Patterson

## 2019-05-28 NOTE — Progress Notes (Signed)
Nocturnal oximetry 05/20/2019: SpO2 <88%:  53 minutes SpO2 <89%:  77 minutes Highest SPO2 99% and lowest SPO2 76%.  Oxygen desaturation events 225, desaturation index 25. Impression: Patient qualifies for nocturnal oxygen supplementation per Medicare guidelines Group 1.  Recommendation: I will set him up for home oxygen therapy, will also refer for sleep study.

## 2019-05-29 ENCOUNTER — Encounter: Payer: Self-pay | Admitting: Cardiology

## 2019-05-29 ENCOUNTER — Ambulatory Visit: Payer: Self-pay | Admitting: Cardiology

## 2019-05-29 ENCOUNTER — Other Ambulatory Visit: Payer: Self-pay

## 2019-05-29 VITALS — BP 98/54 | HR 52 | Resp 15 | Ht 72.0 in | Wt 174.0 lb

## 2019-05-29 DIAGNOSIS — N1831 Chronic kidney disease, stage 3a: Secondary | ICD-10-CM | POA: Diagnosis not present

## 2019-05-29 DIAGNOSIS — G4734 Idiopathic sleep related nonobstructive alveolar hypoventilation: Secondary | ICD-10-CM | POA: Diagnosis not present

## 2019-05-29 DIAGNOSIS — Z95810 Presence of automatic (implantable) cardiac defibrillator: Secondary | ICD-10-CM

## 2019-05-29 DIAGNOSIS — I5022 Chronic systolic (congestive) heart failure: Secondary | ICD-10-CM

## 2019-05-29 DIAGNOSIS — I428 Other cardiomyopathies: Secondary | ICD-10-CM | POA: Diagnosis not present

## 2019-05-29 MED ORDER — ENTRESTO 24-26 MG PO TABS: 1.0000 | ORAL_TABLET | Freq: Two times a day (BID) | ORAL | 1 refills | Status: DC

## 2019-05-29 NOTE — Progress Notes (Signed)
I have let [employer] know

## 2019-06-01 DIAGNOSIS — Z95810 Presence of automatic (implantable) cardiac defibrillator: Secondary | ICD-10-CM | POA: Insufficient documentation

## 2019-06-01 DIAGNOSIS — I493 Ventricular premature depolarization: Secondary | ICD-10-CM | POA: Insufficient documentation

## 2019-06-01 DIAGNOSIS — I4729 Other ventricular tachycardia: Secondary | ICD-10-CM | POA: Insufficient documentation

## 2019-06-02 ENCOUNTER — Other Ambulatory Visit: Payer: Self-pay

## 2019-06-02 ENCOUNTER — Encounter: Payer: Self-pay | Admitting: Internal Medicine

## 2019-06-02 ENCOUNTER — Ambulatory Visit (INDEPENDENT_AMBULATORY_CARE_PROVIDER_SITE_OTHER): Payer: Medicare Other | Admitting: Internal Medicine

## 2019-06-02 VITALS — BP 98/62 | HR 60 | Ht 72.0 in | Wt 167.0 lb

## 2019-06-02 DIAGNOSIS — I5042 Chronic combined systolic (congestive) and diastolic (congestive) heart failure: Secondary | ICD-10-CM

## 2019-06-02 DIAGNOSIS — Z95810 Presence of automatic (implantable) cardiac defibrillator: Secondary | ICD-10-CM

## 2019-06-02 DIAGNOSIS — I4729 Other ventricular tachycardia: Secondary | ICD-10-CM

## 2019-06-02 DIAGNOSIS — I447 Left bundle-branch block, unspecified: Secondary | ICD-10-CM

## 2019-06-02 DIAGNOSIS — I472 Ventricular tachycardia: Secondary | ICD-10-CM | POA: Diagnosis not present

## 2019-06-02 DIAGNOSIS — I493 Ventricular premature depolarization: Secondary | ICD-10-CM | POA: Diagnosis not present

## 2019-06-02 LAB — CUP PACEART INCLINIC DEVICE CHECK
Battery Remaining Longevity: 111 mo
Battery Voltage: 2.99 V
Brady Statistic AP VP Percent: 39.98 %
Brady Statistic AP VS Percent: 0.91 %
Brady Statistic AS VP Percent: 56.23 %
Brady Statistic AS VS Percent: 2.89 %
Brady Statistic RA Percent Paced: 37.95 %
Brady Statistic RV Percent Paced: 1.5 %
Date Time Interrogation Session: 20210601162631
HighPow Impedance: 65 Ohm
Implantable Lead Implant Date: 20200228
Implantable Lead Implant Date: 20200228
Implantable Lead Implant Date: 20200228
Implantable Lead Location: 753858
Implantable Lead Location: 753859
Implantable Lead Location: 753860
Implantable Lead Model: 5076
Implantable Pulse Generator Implant Date: 20200228
Lead Channel Impedance Value: 212.8 Ohm
Lead Channel Impedance Value: 212.8 Ohm
Lead Channel Impedance Value: 216.848
Lead Channel Impedance Value: 228 Ohm
Lead Channel Impedance Value: 232.653
Lead Channel Impedance Value: 304 Ohm
Lead Channel Impedance Value: 399 Ohm
Lead Channel Impedance Value: 399 Ohm
Lead Channel Impedance Value: 456 Ohm
Lead Channel Impedance Value: 456 Ohm
Lead Channel Impedance Value: 475 Ohm
Lead Channel Impedance Value: 475 Ohm
Lead Channel Impedance Value: 722 Ohm
Lead Channel Impedance Value: 722 Ohm
Lead Channel Impedance Value: 722 Ohm
Lead Channel Impedance Value: 817 Ohm
Lead Channel Impedance Value: 817 Ohm
Lead Channel Impedance Value: 817 Ohm
Lead Channel Pacing Threshold Amplitude: 0.5 V
Lead Channel Pacing Threshold Amplitude: 0.75 V
Lead Channel Pacing Threshold Amplitude: 1.25 V
Lead Channel Pacing Threshold Pulse Width: 0.4 ms
Lead Channel Pacing Threshold Pulse Width: 0.4 ms
Lead Channel Pacing Threshold Pulse Width: 0.4 ms
Lead Channel Sensing Intrinsic Amplitude: 1.625 mV
Lead Channel Sensing Intrinsic Amplitude: 14.875 mV
Lead Channel Setting Pacing Amplitude: 1 V
Lead Channel Setting Pacing Amplitude: 1.5 V
Lead Channel Setting Pacing Amplitude: 2.25 V
Lead Channel Setting Pacing Pulse Width: 0.4 ms
Lead Channel Setting Pacing Pulse Width: 0.4 ms
Lead Channel Setting Sensing Sensitivity: 0.3 mV

## 2019-06-02 MED ORDER — LOSARTAN POTASSIUM 25 MG PO TABS
25.0000 mg | ORAL_TABLET | Freq: Every day | ORAL | 3 refills | Status: DC
Start: 2019-06-02 — End: 2020-01-15

## 2019-06-02 NOTE — Progress Notes (Signed)
Patient Care Team: [name], MD as PCP - General (Family Medicine) Adrian Prows, MD as PCP - Cardiology (Cardiology) Deboraha Sprang, MD as PCP - Electrophysiology (Cardiology)   HPI  Miguel Patterson is a 74 y.o. male seen in followup for CRT-D implanted for NICM with VT    Diagnosed 2016 when he presented with orthopnea and recumbent cough.  His EF was 11% with anterior wall thinning.  It was presumed nonischemic.  Seen in the last couple of months by primary cardiology Dr. Lavone Nian, symptoms of dyspnea dizziness and hypotension prompted down titration of Entresto; last week he was reportedly feeling better.  Over the last days however, he has had recurrence of his presyncope.  He says he is "afraid to stand ".  No shortness of breath.  No edema.  Quite fatigued.  His brother has a "enlarged heart"  DATE TEST EF   8/16 MYOVIEW   11 %   PVCs noted  8/18 Echo   13 % LAE severe MR Mod  8/19 Echo  13%   cMRI cMRI 14%  gadolinium enhancement basal septal non-compacted/compacted ratio 4: 1  3/21 Echo  <20% MR-mod        Records and Results Reviewed   Past Medical History:  Diagnosis Date  . Cardiomegaly   . Chronic combined systolic and diastolic heart failure ([place]) 02/10/2018  . DCM (dilated cardiomyopathy) ([place])   . Encounter for adjustment of biventricular implantable cardioverter-defibrillator (ICD) 04/03/2019  . Fatigue   . ICD- Biventricular Medtronic ICD Claria Mri [id number] in situ 02/28/18 03/01/2018  . LBBB (left bundle branch block)   . Mild tricuspid regurgitation   . Moderate aortic regurgitation   . Moderate mitral regurgitation   . Pericardial effusion    INSIGNIFICANT  . S/P biventricular cardiac pacemaker procedure 02/28/18 with ICD MDT  03/01/2018  . Short of breath on exertion   . Systolic heart failure ([place])   . Trace pulmonic regurgitation by prior echocardiogram     Past Surgical History:  Procedure Laterality Date  . BIV ICD INSERTION  CRT-D N/A 02/28/2018   Procedure: BIV ICD INSERTION CRT-D;  Surgeon: Deboraha Sprang, MD;  Location: [facility];  Service: Cardiovascular;  Laterality: N/A;  . LEG SURGERY Left 2000   DUE TO BROKE LEG    Current Meds  Medication Sig  . carvedilol (COREG) 3.125 MG tablet TAKE 1 TABLET BY MOUTH TWICE DAILY WITH MEALS  . magnesium oxide (MAG-OX) 400 MG tablet Take 400 mg by mouth 2 (two) times daily.  . sacubitril-valsartan (ENTRESTO) 24-26 MG Take 1 tablet by mouth 2 (two) times daily.  . tamsulosin (FLOMAX) 0.4 MG CAPS capsule Take by mouth.  . torsemide (DEMADEX) 20 MG tablet Take 1 tablet (20 mg total) by mouth as directed. Twice daily as directed    Allergies  Allergen Reactions  . Aspirin Other (See Comments)    Stomach pain       Review of Systems negative except from HPI and PMH  Physical Exam BP 98/62   Pulse 60   Ht 6' (1.829 m)   Wt 167 lb (75.8 kg)   SpO2 100%   BMI 22.65 kg/m  Well developed and well nourished in no acute distress HENT normal Neck supple with JVP-flat Clear Device pocket well healed; without hematoma or erythema.  There is no tethering  Regular rate and rhythm, no   murmur Abd-soft with active BS No Clubbing cyanosis  edema Skin-warm and dry A & Oriented  Grossly normal sensory and motor function  ECG sinus 2 P-synchronous/ AV  pacing QRS RS V1 and qR 1   Assessment and  Plan  Cardiomyopathy-presumed nonischemicpossibly non-compaction  Congestive heart failure-class 2  chronic  Ventricular ectopy-frequent and complex  Left bundle branch block  Hyperkalemia with Entresto-high dose\  VT- NS  CRT-D   Sleep disturbance  Lightheadedness and hypotension      The patient has had significant recurrence of his lightheadedness and weakness which was addressed initially by Dr. Lavone Nian quite successfully by down titrating his Entresto.  It has come back and he is now scared to stand up.  I have reached out to Dr. Lavone Nian; he is  busy.  We will make an executive decision in the short-term to have him hold his Delene Loll and put her back on losartan.  I reviewed with him the loss of the incremental benefit of the Entresto.  He also has significant ventricular ectopy.  This may be further contributing to his cardiomyopathy.  Might also be secondary to cardiomyopathy; hence, have recommended that we consider an amiodarone suppression trial of a couple of months duration to see if a )we can decrease his PVC burden and B) we can improve his LV function  We have also done undertaking electrical resynchronization optimization; by turning on biventricular pacing as opposed to adaptive CRT we have been able to shift the QRS vector to upright in lead V1 and negative in lead I consistent with more optimal resynchronization

## 2019-06-02 NOTE — Patient Instructions (Signed)
Medication Instructions:  Your physician has recommended you make the following change in your medication:   ** Stop Entresto  ** Begin taking Losartan 25mg  - 1 tablet by mouth daily.   Labwork: None ordered.  Testing/Procedures: None ordered.  Follow-Up: Your physician wants you to follow-up in: 3 months with Dr Caryl Comes. You will receive a reminder letter in the mail two months in advance. If you don't receive a letter, please call our office to schedule the follow-up appointment.  Remote monitoring is used to monitor your Pacemaker of ICD from home. This monitoring reduces the number of office visits required to check your device to one time per year. It allows [place] to keep an eye on the functioning of your device to ensure it is working properly.   Any Other Special Instructions Will Be Listed Below (If Applicable).  If you need a refill on your cardiac medications before your next appointment, please call your pharmacy.

## 2019-06-18 ENCOUNTER — Encounter: Payer: Self-pay | Admitting: Neurology

## 2019-06-18 ENCOUNTER — Ambulatory Visit (INDEPENDENT_AMBULATORY_CARE_PROVIDER_SITE_OTHER): Payer: Medicare Other | Admitting: Neurology

## 2019-06-18 VITALS — BP 104/64 | HR 70 | Ht 72.0 in | Wt 165.0 lb

## 2019-06-18 DIAGNOSIS — G4734 Idiopathic sleep related nonobstructive alveolar hypoventilation: Secondary | ICD-10-CM

## 2019-06-18 DIAGNOSIS — Z95810 Presence of automatic (implantable) cardiac defibrillator: Secondary | ICD-10-CM | POA: Diagnosis not present

## 2019-06-18 DIAGNOSIS — I5042 Chronic combined systolic (congestive) and diastolic (congestive) heart failure: Secondary | ICD-10-CM

## 2019-06-18 NOTE — Progress Notes (Signed)
SLEEP MEDICINE CLINIC    Provider:  Larey Seat, MD  Primary Care Physician:  [name], [address] [place] [address]     Referring Provider: Dr Einar Gip, MD         Chief Complaint according to patient   Patient presents with:    . New Patient (Initial Visit)     pt states that his cardiologist wanted to address if there was any concerns of sleep apnea. patient describes waking up frequently during the night, he has a defibrillator ( Dr Caryl Comes) .  never had a SS      HISTORY OF PRESENT ILLNESS:  Miguel Patterson is a 74  Year- old [place]- Caucasian male patient seen here as a referral on 06/18/2019 from [place], MD.   Chief concern according to patient :   Mr. Marlatt is seen on 18 June 2019 as a new patient to the sleep clinic.  He received a implanted defibrillator in the year 2020, has followed for electrophysiology with Dr. Adam Phenix, his general cardiologist is Dr. Ardeth Perfect at this point.  Dr. Einar Gip is concerned that there may be sleep apnea or sleep hypoxemia conditions present that would explain the patient's condition.  He is waking up frequently and reports fragmented sleep.  He is currently on tamsulosin, carvedilol, magnesium and losartan.  Has never smoked, does not drink alcohol has no recreational drug use or caffeine use.    He  has a past medical history of Cardiomegaly, Chronic combined systolic and diastolic heart failure ([place]) (02/10/2018), DCM (dilated cardiomyopathy) ([place]), Encounter for adjustment of biventricular implantable cardioverter-defibrillator (ICD) (04/03/2019), Fatigue, ICD- Biventricular Medtronic ICD Claria Mri [id number] in situ 02/28/18 (03/01/2018), LBBB (left bundle branch block), Mild tricuspid regurgitation, Moderate aortic regurgitation, Moderate mitral regurgitation, Pericardial effusion, S/P biventricular cardiac pacemaker procedure 02/28/18 with ICD MDT  (03/01/2018), Short of breath on exertion, Systolic heart failure  ([place]), and Trace pulmonic regurgitation by prior echocardiogram.    Sleep relevant medical history: Nocturia 4-7 times , every hour. No snoring, no ENT surgery.    Family medical /sleep history:no other family member on CPAP with OSA, insomnia, sleep walkers.    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  He lives in a household with alone-  He is divorced . His daughter , son in-law and children left his home 26 month ago.   Pets are not present. No Tobacco use, ETOH use , no Caffeine intake. Regular exercise in form of walking  4 miles a day. Daily calisthenics.   Hobbies :reading   Sleep habits are as follows: The patient's dinner time is between 2-3 PM. The patient goes to bed at 8.30-9.30 PM and is promptly asleep- he continues to sleep for 1-2 hours, wakes for many, many bathroom breaks.   The preferred sleep position is supine, with the support of 1 pillow.  Dreams are reportedly less frequent  5.30  AM is the usual rise time.  The patient wakes up spontaneously.  He reports  feeling refreshed - restored in AM,no  Naps are taken.  Review of Systems: Out of a complete 14 system review, the patient complains of only the following symptoms, and all other reviewed systems are negative.:  Fatigue, sleepiness , snoring,   Nocturia- fragmented sleep,no  Insomnia , no RLS.    How likely are you to doze in the following situations: 0 = not likely, 1 = slight chance,  2 = moderate chance, 3 = high chance   Sitting and Reading? Watching Television? Sitting inactive in a public place (theater or meeting)? As a passenger in a car for an hour without a break? Lying down in the afternoon when circumstances permit? Sitting and talking to someone? Sitting quietly after lunch without alcohol? In a car, while stopped for a few minutes in traffic?   Total = 2/ 24 points   FSS endorsed at 18/ 63 points.   Social History   Socioeconomic History  . Marital status:  Divorced    Spouse name: Not on file  . Number of children: 3  . Years of education: Not on file  . Highest education level: Not on file  Occupational History  . Not on file  Tobacco Use  . Smoking status: Never Smoker  . Smokeless tobacco: Never Used  Vaping Use  . Vaping Use: Never used  Substance and Sexual Activity  . Alcohol use: No  . Drug use: No  . Sexual activity: Not on file  Other Topics Concern  . Not on file  Social History Narrative  . Not on file   Social Determinants of Health   Financial Resource Strain:   . Difficulty of Paying Living Expenses:   Food Insecurity:   . Worried About Charity fundraiser in the Last Year:   . Arboriculturist in the Last Year:   Transportation Needs:   . Film/video editor (Medical):   Marland Kitchen Lack of Transportation (Non-Medical):   Physical Activity:   . Days of Exercise per Week:   . Minutes of Exercise per Session:   Stress:   . Feeling of Stress :   Social Connections:   . Frequency of Communication with Friends and Family:   . Frequency of Social Gatherings with Friends and Family:   . Attends Religious Services:   . Active Member of Clubs or Organizations:   . Attends Archivist Meetings:   Marland Kitchen Marital Status:     Family History  Problem Relation Age of Onset  . Hypertension Mother 62  . Thyroid disease Mother   . Healthy Father 4    Past Medical History:  Diagnosis Date  . Cardiomegaly   . Chronic combined systolic and diastolic heart failure ([facility]) 02/10/2018  . DCM (dilated cardiomyopathy) ([place])   . Encounter for adjustment of biventricular implantable cardioverter-defibrillator (ICD) 04/03/2019  . Fatigue   . ICD- Biventricular Medtronic ICD Claria Mri [id number] in situ 02/28/18 03/01/2018  . LBBB (left bundle branch block)   . Mild tricuspid regurgitation   . Moderate aortic regurgitation   . Moderate mitral regurgitation   . Pericardial effusion    INSIGNIFICANT  . S/P biventricular cardiac  pacemaker procedure 02/28/18 with ICD MDT  03/01/2018  . Short of breath on exertion   . Systolic heart failure ([place])   . Trace pulmonic regurgitation by prior echocardiogram     Past Surgical History:  Procedure Laterality Date  . BIV ICD INSERTION CRT-D N/A 02/28/2018   Procedure: BIV ICD INSERTION CRT-D;  Surgeon: Deboraha Sprang, MD;  Location: [facility];  Service: Cardiovascular;  Laterality: N/A;  . LEG SURGERY Left 2000   DUE TO BROKE LEG     Current Outpatient Medications on File Prior to Visit  Medication Sig Dispense Refill  . carvedilol (COREG) 3.125 MG tablet TAKE 1 TABLET BY MOUTH TWICE DAILY WITH MEALS 180 tablet 0  . losartan (COZAAR) 25  MG tablet Take 1 tablet (25 mg total) by mouth daily. 90 tablet 3  . magnesium oxide (MAG-OX) 400 MG tablet Take 400 mg by mouth 2 (two) times daily.    . tamsulosin (FLOMAX) 0.4 MG CAPS capsule Take by mouth.    . torsemide (DEMADEX) 20 MG tablet Take 1 tablet (20 mg total) by mouth as directed. Twice daily as directed 60 tablet 2   No current facility-administered medications on file prior to visit.    Allergies  Allergen Reactions  . Aspirin Other (See Comments)    Stomach pain     Physical exam:  Today's Vitals   06/18/19 1103  BP: 104/64  Pulse: 70  Weight: 165 lb (74.8 kg)  Height: 6' (1.829 m)   Body mass index is 22.38 kg/m.   Wt Readings from Last 3 Encounters:  06/18/19 165 lb (74.8 kg)  06/02/19 167 lb (75.8 kg)  05/29/19 174 lb (78.9 kg)     Ht Readings from Last 3 Encounters:  06/18/19 6' (1.829 m)  06/02/19 6' (1.829 m)  05/29/19 6' (1.829 m)      General: The patient is awake, alert and appears not in acute distress. The patient is well groomed. Head: Normocephalic, atraumatic. Neck is supple. Mallampati 2. neck circumference:14. 25  inches . Nasal airflow patent.  Retrognathia is not  seen.  Dental status:  Cardiovascular:  Regular rate and cardiac rhythm by pulse,  without distended  neck veins. Respiratory: Lungs are clear to auscultation.  Skin:  Without evidence of ankle edema, or rash. Trunk: The patient's posture is erect.   Neurologic exam : The patient is awake and alert, oriented to place and time.   Memory subjective described as intact.  Attention span & concentration ability appears normal.  Speech is fluent,  without  dysarthria, dysphonia or aphasia.  Mood and affect are appropriate.   Cranial nerves: no loss of smell or taste reported  Pupils are equal and briskly reactive to light. Funduscopic exam deferred. .  Extraocular movements in vertical and horizontal planes were intact and without nystagmus. No Diplopia. Visual fields by finger perimetry are intact. Hearing was intact to soft voice and finger rubbing.    Facial sensation intact to fine touch.  Facial motor strength is symmetric and tongue and uvula move midline.  Neck ROM : rotation, tilt and flexion extension were normal for age and shoulder shrug was symmetrical.    Motor exam:  Symmetric bulk, tone and ROM.   Normal tone without cog wheeling, symmetric grip strength .   Sensory:  Fine touch, pinprick and vibration were tested  and  normal.  Proprioception tested in the upper extremities was normal.   Coordination: Rapid alternating movements in the fingers/hands were of normal speed.  The Finger-to-nose maneuver was intact without evidence of ataxia, dysmetria or tremor.   Gait and station: Patient could rise unassisted from a seated position, walked without assistive device.  Stance is of normal width/ base and the patient turned with 3 steps.  Toe and heel walk were deferred.  Deep tendon reflexes: in the  upper and lower extremities are symmetric and intact.  Babinski response was deferred .    Mr. Huezo  presents today with a nonischemic dilated cardiomyopathy and severely depressed left ventricular systolic function.  Ejection fraction has been in the teens for the last 3  years.  A cardiac MRI shows a severely dilated left ventricle with normal wall thickness and an LVEF of 14%.  This combined systolic and diastolic heart failure has been treated with the implanted cardioverter defibrillator and ICD biventricular Medtronic.  The patient also has multi valvular regurgitation, he reports that he feels rather well but he does have a lot of nocturia.  Dr. Ardeth Perfect is concerned that he may have hypoxemia at night looking at his metabolic panels the last one was drawn on 18 May 2019, creatinine was 1.72 sodium 141 potassium 4.1 glucose 86 BUN 48.  His hemoglobin and hematocrit are not elevated platelets were low at 137.  TSH in normal range, BNP 9 a 34.8 as of March and 1026 as of May.  Patient is not on a lot of medications he had an exercise stress test in August 2016 cardiac MRI escorted before echocardiogram was repeated earlier this year in March 2021 and again EF was less than 20% calculated to be 11% only.   nocturnal oximetry was performed on 19 May it showed 53 minutes of oxygen desaturation under 88% and 77 minutes of oxygen desaturation under 89% the lowest nadir SPO2 was 76%.  Based on this we should have a attended sleep study for for this gentleman.   After spending a total time of  45 minutes face to face and additional time for physical and neurologic examination, review of laboratory studies,  personal review of imaging studies, reports and results of other testing and review of referral information / records as far as provided in visit, I have established the following Plan . 1)   Attended polysomnography. He is at high risk of central apnea.  2)  Special attention to oxymetry. 3)  Any a AHI of more than 5 needs to be treated,   Please note - the patient was just placed on oxygen at home, 2 liters a min. Dr Einar Gip, MD .   I would like to thank Dr Einar Gip, MD,  for allowing me to meet with and to take care of this pleasant patient.   In short, Miguel Patterson  is presenting with dilated cardiomyopathy and 2 ONOs indicating severe hypoxemia.  I plan to follow up either personally or through our NP within 2-3 month.   Electronically signed by: Larey Seat, MD 06/18/2019 11:28 AM  [facility] Board certified by The [employer] of Sleep Medicine and Diplomate of the [employer] of Sleep Medicine. Board certified In Neurology through the [place], Fellow of the [employer] of Neurology. Medical Director of [facility].

## 2019-06-18 NOTE — Patient Instructions (Signed)

## 2019-06-26 ENCOUNTER — Encounter: Payer: Self-pay | Admitting: Cardiology

## 2019-06-26 ENCOUNTER — Other Ambulatory Visit: Payer: Self-pay

## 2019-06-26 ENCOUNTER — Ambulatory Visit: Payer: Self-pay | Admitting: Cardiology

## 2019-06-26 VITALS — BP 92/58 | HR 70 | Resp 15 | Ht 72.0 in | Wt 165.0 lb

## 2019-06-26 DIAGNOSIS — I5042 Chronic combined systolic (congestive) and diastolic (congestive) heart failure: Secondary | ICD-10-CM

## 2019-06-26 DIAGNOSIS — Z01810 Encounter for preprocedural cardiovascular examination: Secondary | ICD-10-CM

## 2019-06-26 DIAGNOSIS — G4734 Idiopathic sleep related nonobstructive alveolar hypoventilation: Secondary | ICD-10-CM

## 2019-06-26 DIAGNOSIS — Z95810 Presence of automatic (implantable) cardiac defibrillator: Secondary | ICD-10-CM

## 2019-06-26 LAB — BASIC METABOLIC PANEL
BUN/Creatinine Ratio: 29 — ABNORMAL HIGH (ref 10–24)
BUN: 56 mg/dL — ABNORMAL HIGH (ref 8–27)
CO2: 27 mmol/L (ref 20–29)
Calcium: 9.5 mg/dL (ref 8.6–10.2)
Chloride: 100 mmol/L (ref 96–106)
Creatinine, Ser: 1.94 mg/dL — ABNORMAL HIGH (ref 0.76–1.27)
GFR calc Af Amer: 39 mL/min/{1.73_m2} — ABNORMAL LOW (ref >59)
GFR calc non Af Amer: 33 mL/min/{1.73_m2} — ABNORMAL LOW (ref >59)
Glucose: 105 mg/dL — ABNORMAL HIGH (ref 65–99)
Potassium: 4.9 mmol/L (ref 3.5–5.2)
Sodium: 142 mmol/L (ref 134–144)

## 2019-06-26 LAB — BRAIN NATRIURETIC PEPTIDE: BNP: 631.8 pg/mL — ABNORMAL HIGH (ref 0.0–100.0)

## 2019-06-26 NOTE — Progress Notes (Signed)
I have reviewed it with the patient. Mild worsening renal function probably from diuresis

## 2019-06-26 NOTE — Progress Notes (Signed)
Primary Physician:  [name], MD   Patient ID: Miguel Patterson, male    DOB: 07-14-1945, 74 y.o.   MRN: [id number]   Chief Complaint  Patient presents with  . Follow-up    1 month  . Congestive Heart Failure   HPI    Miguel Patterson  is a 74 y.o. male  from [place]. He has severe non ischemic dilated cardiomyopathy with severely depressed left-ventricular systolic function, Ejection fraction has been in teens for past 3 years in spite of optimal medical therapy. 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 Underwent BIV ICD CRT insertion on 02/28/2018 with Dr. Caryl Comes for nonischemic cardiomyopathy.   He was evaluated by Dr. Adam Phenix on 06/02/2019, he did change his BiV ICD settings from adaptive CRT to turning on biventricular pacing.  His Entresto was also discontinued and switched to low-dose losartan in view of marked dizziness and low blood pressure.  Patient is now feeling much improved with regard to dizziness especially and also feels like his energy level is improved. He is back to exercising daily and walking 4 miles a day and also muscle strengthening exercises.   Past Medical History:  Diagnosis Date  . Cardiomegaly   . Chronic combined systolic and diastolic heart failure ([place]) 02/10/2018  . DCM (dilated cardiomyopathy) ([place])   . Encounter for adjustment of biventricular implantable cardioverter-defibrillator (ICD) 04/03/2019  . Fatigue   . ICD- Biventricular Medtronic ICD Claria Mri [id number] in situ 02/28/18 03/01/2018  . LBBB (left bundle branch block)   . Mild tricuspid regurgitation   . Moderate aortic regurgitation   . Moderate mitral regurgitation   . Pericardial effusion    INSIGNIFICANT  . S/P biventricular cardiac pacemaker procedure 02/28/18 with ICD MDT  03/01/2018  . Short of breath on exertion   . Systolic heart  failure ([place])   . Trace pulmonic regurgitation by prior echocardiogram     Past Surgical History:  Procedure Laterality Date  . BIV ICD INSERTION CRT-D N/A 02/28/2018   Procedure: BIV ICD INSERTION CRT-D;  Surgeon: Deboraha Sprang, MD;  Location: [facility];  Service: Cardiovascular;  Laterality: N/A;  . LEG SURGERY Left 2000   DUE TO BROKE LEG   Family History  Problem Relation Age of Onset  . Hypertension Mother 70  . Thyroid disease Mother   . Healthy Father 43    Social History   Tobacco Use  . Smoking status: Never Smoker  . Smokeless tobacco: Never Used  Substance Use Topics  . Alcohol use: No    Marital Status: Divorced  ROS:   Review of Systems  Constitutional: Negative for malaise/fatigue.  Cardiovascular: Positive for dyspnea on exertion (improved). Negative for chest pain and leg swelling.  Genitourinary: Positive for frequency and hesitancy.   Objective:  Blood pressure (!) 92/58, pulse 70, resp. rate 15, height 6' (1.829 m), weight 165 lb (74.8 kg), SpO2 100 %. Body mass index is 22.38 kg/m.  Vitals with BMI 06/26/2019 06/18/2019 06/02/2019  Height 6' 0"  6' 0"  6' 0"   Weight 165 lbs 165 lbs 167 lbs  BMI 22.37 15.72 62.03  Systolic 92 559 98  Diastolic 58 64 62  Pulse 70 70 60     Physical Exam Vitals reviewed.  Constitutional:      Appearance: He is well-developed.  Cardiovascular:     Rate and Rhythm:  Normal rate and regular rhythm.     Pulses: Normal pulses and intact distal pulses.     Heart sounds: Normal heart sounds. No murmur heard.  No gallop.      Comments: NO JVD, No leg edema. Pulmonary:     Effort: Pulmonary effort is normal. No accessory muscle usage or respiratory distress.     Breath sounds: Normal breath sounds.  Abdominal:     General: Bowel sounds are normal.     Palpations: Abdomen is soft.    Laboratory examination:   CMP Latest Ref Rng & Units 05/18/2019 03/20/2019 02/25/2019  Glucose 65 - 99 mg/dL 86 100(H) 93  BUN  8 - 27 mg/dL 48(H) 30(H) 28(H)  Creatinine 0.76 - 1.27 mg/dL 1.72(H) 1.45(H) 1.41(H)  Sodium 134 - 144 mmol/L 141 145(H) 145(H)  Potassium 3.5 - 5.2 mmol/L 4.1 5.1 5.5(H)  Chloride 96 - 106 mmol/L 100 107(H) 107(H)  CO2 20 - 29 mmol/L 25 23 24   Calcium 8.6 - 10.2 mg/dL 9.2 9.4 9.4  Total Protein 6.0 - 8.5 g/dL - 7.1 -  Total Bilirubin 0.0 - 1.2 mg/dL - 0.7 -  Alkaline Phos 39 - 117 IU/L - 67 -  AST 0 - 40 IU/L - 16 -  ALT 0 - 44 IU/L - 14 -   CBC Latest Ref Rng & Units 02/25/2018  WBC 3.4 - 10.8 x10E3/uL 5.2  Hemoglobin 13.0 - 17.7 g/dL 14.1  Hematocrit 37.5 - 51.0 % 43.3  Platelets 150 - 450 x10E3/uL 137(L)   Lipid Panel     Component Value Date/Time   CHOL 179 03/20/2019 0804   TRIG 61 03/20/2019 0804   HDL 51 03/20/2019 0804   LDLCALC 116 (H) 03/20/2019 0804   HEMOGLOBIN A1C No results found for: HGBA1C, MPG TSH Recent Labs    03/20/19 0804 04/24/19 0932  TSH 4.720* 3.360    BNP (last 3 results) Recent Labs    03/20/19 0804 04/24/19 0933 05/18/19 0943  BNP 934.8* 1,455.6* 1,026.4*   Labs not auto populated  into epic 06/22/2019:  Serum glucose 105, BUN 56, creatinine 1.94, EGFR 33 mL.  Sodium 142, potassium 4.9.  BNP 631.  PRN Meds:. There are no discontinued medications. Current Meds  Medication Sig  . carvedilol (COREG) 3.125 MG tablet TAKE 1 TABLET BY MOUTH TWICE DAILY WITH MEALS  . losartan (COZAAR) 25 MG tablet Take 1 tablet (25 mg total) by mouth daily.  . magnesium oxide (MAG-OX) 400 MG tablet Take 400 mg by mouth 2 (two) times daily.  . tamsulosin (FLOMAX) 0.4 MG CAPS capsule Take by mouth.  . torsemide (DEMADEX) 20 MG tablet Take 1 tablet (20 mg total) by mouth as directed. Twice daily as directed (Patient taking differently: Take 20 mg by mouth 2 (two) times daily. )    Radiology:   No results found.  Cardiac Studies:   Exercise sestamibi stress test 08/27/2014: 1. The resting electrocardiogram demonstrated normal sinus rhythm, incomplete  LBBB and no resting arrhythmias. ST segment depression and T-wave inversion in inferior and lateral leads. Stress EKG is negative for ischemia. The patient performed treadmill exercise using a Bruce protocol, completing 6:26 minutes. Thre were occasional PVCs. The patient completed an estimated workload of 7.65 METS, 95% of the maximum predicted heart rate. The stress test was terminated because of dyspnea. 2. The LV is markedly dilated both at rest and stress images. The LV end diastolic volume was 818EX. Thre is anterior wall thinning. There is no evidence of  ischemia. Thre is severe generalized hypokinesis and LVEF markedly depressed at 11%. Findings are consistent with dilated cardiomyopathy. Clinical correlation is recommended.  Cardiac MRI 11/20/2017: 1. Severely dilated left ventricle with normal wall thickness and severely decreased systolic function (LVEF = 14%). There is severe diffuse hypokinesis and paradoxical septal motion consistent with LBBB. The ratio of non-compacted to compacted myocardium in the apical and mid portions of the ventricle is > 4:1. There is midwall late gadolinium enhancement in the basal anteroseptal and inferoseptal myocardium. 2. Normal right ventricular size, thickness and systolic function (LVEF = 55%). There are no regional wall motion abnormalities. 3. Severely dilated left atrium (59 mm), normal right atrial size. 4. Moderately dilated pulmonary artery measuring 36 mm. 5. Moderate aortic and mitral regurgitation, mild tricuspid regurgitation. 6. Mild pericardial effusion. Collectively, these findings are consistent with end stage non-compaction cardiomyopathy.  Echocardiogram 03/05/2019:  Severely depressed LV systolic function with visual EF <20%. Severe global  hypokinesis Left ventricle cavity is dilated. Dilated cardiomyopathy.  Doppler evidence of grade III (restrictive) diastolic dysfunction, elevated LAP. Calculated EF 11%.  Left atrial cavity is  severely dilated. Interatrial septum bulges to the  right suggests elevated left atrial pressure.  Moderate aortic regurgitation.  Moderate to severe mitral regurgitation.  Moderate tricuspid regurgitation. Moderate pulmonary hypertension. RVSP  measures 59 mmHg.  Mild pulmonic regurgitation.  Moderate size pericardial effusion with no hemodynamic compromise.  IVC is dilated with a respiratory response of <50%.  Prior study dated 07/2018, 08/15/2017, no significant change.  Nocturnal oximetry 05/20/2019: SpO2 <88%:  53 minutes SpO2 <89%:  77 minutes Highest SPO2 99% and lowest SPO2 76%.  Oxygen desaturation events 225, desaturation index 25. Impression: Patient qualifies for nocturnal oxygen supplementation per Medicare guidelines Group 1.  EKG:  03/18/1019: Probably AV paced rhythm with first-degree AV block, biventricular pacemaker detected.  No further analysis. Single PVC.  Assessment:     ICD-10-CM   1. Chronic combined systolic and diastolic heart failure (HCC)  I50.42 Brain natriuretic peptide    Basic metabolic panel    Basic metabolic panel    Brain natriuretic peptide  2. ICD- Biventricular Medtronic ICD Claria Mri [id number] in situ 02/28/18  Z95.810   3. Nocturnal hypoxemia  G47.34   4. Preoperative cardiovascular examination  Z01.810     No orders of the defined types were placed in this encounter.  There are no discontinued medications.   Recommendations:   ADRIN JULIAN  is a 74 y.o. male  from [place]. He has severe non ischemic dilated cardiomyopathy with severely depressed left-ventricular systolic function, Ejection fraction has been in teens for past 3 years in spite of optimal medical therapy. 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 Underwent BIV ICD CRT insertion on 02/28/2018 with Dr. Caryl Comes for nonischemic  cardiomyopathy.   He was evaluated by Dr. Adam Phenix on 06/02/2019, he did change his BiV ICD settings from adaptive CRT to turning on biventricular pacing.  His Entresto was also discontinued and switched to low-dose losartan in view of marked dizziness and low blood pressure. He is back to exercising daily and walking 4 miles a day and also muscle strengthening exercises.   He was evaluated by Dr. Asencion Partridge Dohmeier for nocturnal hypoxemia and he has been scheduled for a sleep study sometime next week.  He remains stable from cardiac standpoint now, he was planning on having TURP,  Labs are looking good, BNP has stabilized and is trending down, will continue to repeat BMP and BNP in 2 weeks.  OV in 6 weeks.   He should be able to proceed with TURP at moderate risk for CHF. He has severe symptoms and  disturbed sleep due to frequency of urination and incomplete voiding and is affecting his lifestyle.   Adrian Prows, MD, [facility] 06/26/2019, 10:19 AM [facility] [place] Pager: [phone] Office: [phone]  CC: Drs. Ron Rosana Hoes and Jolyn Nap.

## 2019-06-30 ENCOUNTER — Ambulatory Visit (INDEPENDENT_AMBULATORY_CARE_PROVIDER_SITE_OTHER): Payer: Medicare Other | Admitting: Neurology

## 2019-06-30 DIAGNOSIS — Z95810 Presence of automatic (implantable) cardiac defibrillator: Secondary | ICD-10-CM

## 2019-06-30 DIAGNOSIS — R063 Periodic breathing: Secondary | ICD-10-CM

## 2019-06-30 DIAGNOSIS — G4731 Primary central sleep apnea: Secondary | ICD-10-CM

## 2019-06-30 DIAGNOSIS — G4734 Idiopathic sleep related nonobstructive alveolar hypoventilation: Secondary | ICD-10-CM

## 2019-06-30 DIAGNOSIS — I5042 Chronic combined systolic (congestive) and diastolic (congestive) heart failure: Secondary | ICD-10-CM

## 2019-07-02 ENCOUNTER — Ambulatory Visit (INDEPENDENT_AMBULATORY_CARE_PROVIDER_SITE_OTHER): Payer: Medicare Other | Admitting: *Deleted

## 2019-07-02 DIAGNOSIS — I5042 Chronic combined systolic (congestive) and diastolic (congestive) heart failure: Secondary | ICD-10-CM | POA: Diagnosis not present

## 2019-07-02 LAB — CUP PACEART REMOTE DEVICE CHECK
Battery Remaining Longevity: 105 mo
Battery Voltage: 3.01 V
Brady Statistic AP VP Percent: 85.19 %
Brady Statistic AP VS Percent: 0.19 %
Brady Statistic AS VP Percent: 14.15 %
Brady Statistic AS VS Percent: 0.46 %
Brady Statistic RA Percent Paced: 82.53 %
Brady Statistic RV Percent Paced: 93.37 %
Date Time Interrogation Session: 20210701033523
HighPow Impedance: 59 Ohm
Implantable Lead Implant Date: 20200228
Implantable Lead Implant Date: 20200228
Implantable Lead Implant Date: 20200228
Implantable Lead Location: 753858
Implantable Lead Location: 753859
Implantable Lead Location: 753860
Implantable Lead Model: 5076
Implantable Pulse Generator Implant Date: 20200228
Lead Channel Impedance Value: 171 Ohm
Lead Channel Impedance Value: 188.1 Ohm
Lead Channel Impedance Value: 188.1 Ohm
Lead Channel Impedance Value: 195.429
Lead Channel Impedance Value: 195.429
Lead Channel Impedance Value: 285 Ohm
Lead Channel Impedance Value: 342 Ohm
Lead Channel Impedance Value: 342 Ohm
Lead Channel Impedance Value: 361 Ohm
Lead Channel Impedance Value: 418 Ohm
Lead Channel Impedance Value: 418 Ohm
Lead Channel Impedance Value: 456 Ohm
Lead Channel Impedance Value: 608 Ohm
Lead Channel Impedance Value: 646 Ohm
Lead Channel Impedance Value: 646 Ohm
Lead Channel Impedance Value: 665 Ohm
Lead Channel Impedance Value: 703 Ohm
Lead Channel Impedance Value: 703 Ohm
Lead Channel Pacing Threshold Amplitude: 0.625 V
Lead Channel Pacing Threshold Amplitude: 1.25 V
Lead Channel Pacing Threshold Amplitude: 1.375 V
Lead Channel Pacing Threshold Pulse Width: 0.4 ms
Lead Channel Pacing Threshold Pulse Width: 0.4 ms
Lead Channel Pacing Threshold Pulse Width: 0.4 ms
Lead Channel Sensing Intrinsic Amplitude: 1 mV
Lead Channel Sensing Intrinsic Amplitude: 1 mV
Lead Channel Sensing Intrinsic Amplitude: 15.875 mV
Lead Channel Sensing Intrinsic Amplitude: 15.875 mV
Lead Channel Setting Pacing Amplitude: 1 V
Lead Channel Setting Pacing Amplitude: 2 V
Lead Channel Setting Pacing Amplitude: 2.5 V
Lead Channel Setting Pacing Pulse Width: 0.4 ms
Lead Channel Setting Pacing Pulse Width: 0.4 ms
Lead Channel Setting Sensing Sensitivity: 0.3 mV

## 2019-07-03 NOTE — Progress Notes (Signed)
Remote ICD transmission.   

## 2019-07-03 NOTE — Procedures (Signed)
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   Mr. Catherman is seen on 18 June 2019 as a new patient to the sleep clinic.  He received an implanted defibrillator in the year 2020, has followed for electrophysiology with Dr. Adam Phenix, his general cardiologist is Dr. Einar Gip at this point.  Dr. Einar Gip is concerned that there may be sleep apnea or sleep hypoxemia conditions present that would explain the patient's condition.  He is waking up frequently and reports fragmented sleep.  He is currently on tamsulosin, carvedilol, magnesium and losartan.  Has never smoked, does not drink alcohol, has no recreational drug use nor caffeine use.   He has a past medical history of Cardiomegaly, Chronic combined systolic and diastolic heart failure ([place]) (02/10/2018), DCM (dilated cardiomyopathy) ([place]), Encounter for adjustment of biventricular implantable cardioverter-defibrillator (ICD) (04/03/2019), Fatigue, ICD- Biventricular Medtronic ICD (cleared for MRI )in situ 02/28/18 (03/01/2018), LBBB (left bundle branch block), Mild tricuspid regurgitation, Moderate aortic regurgitation, Moderate mitral regurgitation, Pericardial effusion, S/P biventricular cardiac pacemaker procedure 02/28/18 with ICD MDT  (03/01/2018), Short of breath on exertion, Systolic heart failure ([place]), and Trace pulmonic regurgitation by prior echocardiogram.     The patient endorsed the Epworth Sleepiness Scale at 2 points.   The patient's weight 165 pounds with a height of 72 (inches), resulting in a BMI of 22.4 kg/m2. The patient's neck circumference measured 14 inches.  CURRENT MEDICATIONS: Coreg, Cozaar, Flomax, Demadex   PROCEDURE:  This is a multichannel digital polysomnogram  utilizing the Somnostar 11.2 system.  Electrodes and sensors were applied and monitored per AASM Specifications.   EEG, EOG, Chin and Limb EMG, were sampled at 200 Hz.  ECG, Snore and Nasal Pressure, Thermal Airflow, Respiratory Effort, CPAP Flow and Pressure, Oximetry was sampled at 50 Hz. Digital video and audio were recorded.      BASELINE STUDY: Lights Out was at 20:49 and Lights On at 04:21.  Total recording time (TRT) was 452.5 minutes, with a total sleep time (TST) of 299.5 minutes.   The patient's sleep latency was 89.5 minutes.  REM latency was 89 minutes.  The sleep efficiency was 66.2 %.     SLEEP ARCHITECTURE: WASO (Wake after sleep onset) was 101.5 minutes.  There were 49.5 minutes in Stage N1, 84.5 minutes Stage N2, 116.5 minutes Stage N3 and 49 minutes in Stage REM.  The percentage of Stage N1 was 16.5%, Stage N2 was 28.2%, Stage N3 was 38.9% and Stage R (REM sleep) was 16.4%.     RESPIRATORY ANALYSIS:  There were a total of 161 respiratory events:  0 obstructive apneas, 94 central apneas and 56 mixed apneas with a total of 150 apneas and an apnea index (AI) of 30.1 /hour. There were 11 hypopneas with a hypopnea index of 2.2 /hour.      The total APNEA/HYPOPNEA INDEX (AHI) was 32.3/hour.  5 events occurred in REM sleep and 166 events in NREM. The REM AHI was 6.1 /hour, versus a non-REM AHI of 37.4. The patient spent 100.5 minutes of total sleep time in the supine position and 199 minutes in non-supine. The supine AHI was 62.1 versus a non-supine AHI of 17.2.  OXYGEN SATURATION &  C02:  The Wake baseline 02 saturation was 98%, with the lowest being 81%. Time spent below 89% saturation equaled 75 minutes.  The arousals were noted as: 24 were spontaneous, 0 were associated with PLMs, 86 were associated with respiratory events. The patient had a total of 0 Periodic Limb Movements.   Audio and video analysis did show some non-periodic-limb movements, and cyclic breathing, Cheyne stokes  respirations and central apnea with severe hypoxemia. Screen shots were attached to the technical reports.  No complex behaviors, phonations or vocalizations.   EKG was in paced rhythm.  IMPRESSION:  1. Cyclic breathing in NREM sleep, dominantly Central Sleep Apnea and Mixed Apnea, at a severe degree.  2. Hypoxemia ; total time spent below 89% saturation equaled 75 minutes. Nadir at 81%   RECOMMENDATIONS:  1. [place] Breathing associated with CHF.  2. Advise urgently a full night attended, PAP titration study to optimize therapy.     I certify that I have reviewed the entire raw data recording prior to the issuance of this report in accordance with the Standards of Accreditation of the American Academy of Sleep Medicine (AASM)   Larey Seat, MD Diplomat, American Board of Psychiatry and Neurology  Diplomat, American Board of Sleep Medicine Market researcher, [facility] [employer]

## 2019-07-03 NOTE — Addendum Note (Signed)
Addended by: Larey Seat on: 07/03/2019 06:46 PM   Modules accepted: Orders

## 2019-07-03 NOTE — Progress Notes (Signed)
IMPRESSION:  1. Cyclic breathing in NREM sleep, dominantly Central Sleep Apnea and Mixed Apnea, at a severe degree. With an AHI of 32.2/h.  2. Hypoxemia ; total time spent below 89% saturation equaled 75 minutes. Nadir at 81%.   RECOMMENDATIONS:  1. [place] Breathing associated with CHF.  2. Advise urgently a full night attended, PAP titration study to optimize therapy.

## 2019-07-04 NOTE — Progress Notes (Signed)
Severe central sleep apnea and Cheyne-Stokes breathing. Has been set up for urgent full night BiPAP study.

## 2019-07-10 ENCOUNTER — Encounter: Payer: Self-pay | Admitting: Neurology

## 2019-07-10 ENCOUNTER — Telehealth: Payer: Self-pay | Admitting: Neurology

## 2019-07-10 NOTE — Telephone Encounter (Signed)
Called patient to discuss sleep study results. No answer at this time. LVM for the patient to call back.   

## 2019-07-10 NOTE — Telephone Encounter (Signed)
Pt returned call. I advised pt that Dr. Brett Fairy reviewed their sleep study results and found that has complex sleep apnea and recommends that pt be treated with a cpap. Dr. Brett Fairy recommends that pt return for a repeat sleep study in order to properly titrate the cpap and ensure a good mask fit. Pt is agreeable to returning for a titration study. I advised pt that our sleep lab will file with pt's insurance and call pt to schedule the sleep study when we hear back from the pt's insurance regarding coverage of this sleep study. Pt verbalized understanding of results. Pt had no questions at this time but was encouraged to call back if questions arise.

## 2019-07-10 NOTE — Telephone Encounter (Signed)
-----   Message from Larey Seat, MD sent at 07/03/2019  6:46 PM EDT ----- IMPRESSION:  1. Cyclic breathing in NREM sleep, dominantly Central Sleep Apnea and Mixed Apnea, at a severe degree. With an AHI of 32.2/h.  2. Hypoxemia ; total time spent below 89% saturation equaled 75 minutes. Nadir at 81%.   RECOMMENDATIONS:  1. [place] Breathing associated with CHF.  2. Advise urgently a full night attended, PAP titration study to optimize therapy.

## 2019-07-14 ENCOUNTER — Telehealth: Payer: Self-pay

## 2019-07-14 LAB — BASIC METABOLIC PANEL
BUN/Creatinine Ratio: 27 — ABNORMAL HIGH (ref 10–24)
BUN: 42 mg/dL — ABNORMAL HIGH (ref 8–27)
CO2: 26 mmol/L (ref 20–29)
Calcium: 9.6 mg/dL (ref 8.6–10.2)
Chloride: 105 mmol/L (ref 96–106)
Creatinine, Ser: 1.56 mg/dL — ABNORMAL HIGH (ref 0.76–1.27)
GFR calc Af Amer: 50 mL/min/{1.73_m2} — ABNORMAL LOW (ref >59)
GFR calc non Af Amer: 43 mL/min/{1.73_m2} — ABNORMAL LOW (ref >59)
Glucose: 104 mg/dL — ABNORMAL HIGH (ref 65–99)
Potassium: 4.3 mmol/L (ref 3.5–5.2)
Sodium: 146 mmol/L — ABNORMAL HIGH (ref 134–144)

## 2019-07-14 LAB — BRAIN NATRIURETIC PEPTIDE: BNP: 611.9 pg/mL — ABNORMAL HIGH (ref 0.0–100.0)

## 2019-07-14 NOTE — Telephone Encounter (Signed)
LVM for pt to call me back to schedule sleep study  

## 2019-07-15 NOTE — Telephone Encounter (Signed)
Voicemail message, 10:52am:  Returning call to schedule sleep study.

## 2019-07-21 DIAGNOSIS — R319 Hematuria, unspecified: Secondary | ICD-10-CM | POA: Diagnosis not present

## 2019-07-21 DIAGNOSIS — Z01812 Encounter for preprocedural laboratory examination: Secondary | ICD-10-CM | POA: Diagnosis not present

## 2019-07-21 DIAGNOSIS — N400 Enlarged prostate without lower urinary tract symptoms: Secondary | ICD-10-CM | POA: Diagnosis not present

## 2019-07-22 NOTE — Telephone Encounter (Signed)
From pt

## 2019-07-23 ENCOUNTER — Other Ambulatory Visit: Payer: Self-pay | Admitting: Cardiology

## 2019-07-24 NOTE — Telephone Encounter (Signed)
Labs 07/21/2018:   Hb 12.4/HCT 37.1, platelets 132.  Normal indicis.

## 2019-07-25 ENCOUNTER — Other Ambulatory Visit: Payer: Self-pay | Admitting: Cardiology

## 2019-07-25 DIAGNOSIS — I5023 Acute on chronic systolic (congestive) heart failure: Secondary | ICD-10-CM

## 2019-08-10 ENCOUNTER — Ambulatory Visit (INDEPENDENT_AMBULATORY_CARE_PROVIDER_SITE_OTHER): Payer: Medicare Other | Admitting: Neurology

## 2019-08-10 ENCOUNTER — Other Ambulatory Visit: Payer: Self-pay

## 2019-08-10 ENCOUNTER — Encounter: Payer: Self-pay | Admitting: Cardiology

## 2019-08-10 ENCOUNTER — Ambulatory Visit: Payer: Self-pay | Admitting: Cardiology

## 2019-08-10 VITALS — BP 117/69 | HR 74 | Resp 17 | Ht 72.0 in | Wt 169.0 lb

## 2019-08-10 DIAGNOSIS — Z01810 Encounter for preprocedural cardiovascular examination: Secondary | ICD-10-CM

## 2019-08-10 DIAGNOSIS — I447 Left bundle-branch block, unspecified: Secondary | ICD-10-CM | POA: Diagnosis not present

## 2019-08-10 DIAGNOSIS — I5042 Chronic combined systolic (congestive) and diastolic (congestive) heart failure: Secondary | ICD-10-CM

## 2019-08-10 DIAGNOSIS — Z95810 Presence of automatic (implantable) cardiac defibrillator: Secondary | ICD-10-CM

## 2019-08-10 DIAGNOSIS — I428 Other cardiomyopathies: Secondary | ICD-10-CM

## 2019-08-10 DIAGNOSIS — G4734 Idiopathic sleep related nonobstructive alveolar hypoventilation: Secondary | ICD-10-CM

## 2019-08-10 DIAGNOSIS — I493 Ventricular premature depolarization: Secondary | ICD-10-CM | POA: Diagnosis not present

## 2019-08-10 DIAGNOSIS — R063 Periodic breathing: Secondary | ICD-10-CM

## 2019-08-10 NOTE — Progress Notes (Signed)
Primary Physician:  [name], MD   Patient ID: Miguel Patterson, male    DOB: 1945/11/16, 74 y.o.   MRN: [id number]   Chief Complaint  Patient presents with  . Congestive Heart Failure  . Follow-up    6 week   HPI    Miguel Patterson  is a 73 y.o. male  from [place]. He has severe non ischemic dilated cardiomyopathy with severely depressed left-ventricular systolic function, Ejection fraction has been in teens for past 3 years in spite of optimal medical therapy. 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 Underwent BIV ICD CRT insertion on 02/28/2018 with Dr. Caryl Comes for nonischemic cardiomyopathy.   He was evaluated by Dr. Adam Phenix on 06/02/2019, he did change his BiV ICD settings from adaptive CRT to turning on biventricular pacing.  Due to hypotension and severe dizziness, Entresto was discontinued and switched over to low-dose losartan.  He has done well since the change, he has not had any further leg edema, dyspnea has improved and stable, states that he feels much better than he did 6 months ago.  No PND or orthopnea.  No dizziness or syncope.  He is back to exercising daily and walking 4 miles a day and also muscle strengthening exercises.   He is accompanied by his daughter, they are wondering what is the future direction of therapy especially in view of severe LV systolic dysfunction.  Past Medical History:  Diagnosis Date  . Cardiomegaly   . Chronic combined systolic and diastolic heart failure ([place]) 02/10/2018  . DCM (dilated cardiomyopathy) ([place])   . Encounter for adjustment of biventricular implantable cardioverter-defibrillator (ICD) 04/03/2019  . Fatigue   . ICD- Biventricular Medtronic ICD Claria Mri [id number] in situ 02/28/18 03/01/2018  . LBBB (left bundle branch block)   . Mild tricuspid regurgitation   . Moderate aortic  regurgitation   . Moderate mitral regurgitation   . Pericardial effusion    INSIGNIFICANT  . S/P biventricular cardiac pacemaker procedure 02/28/18 with ICD MDT  03/01/2018  . Short of breath on exertion   . Systolic heart failure ([place])   . Trace pulmonic regurgitation by prior echocardiogram     Past Surgical History:  Procedure Laterality Date  . BIV ICD INSERTION CRT-D N/A 02/28/2018   Procedure: BIV ICD INSERTION CRT-D;  Surgeon: Deboraha Sprang, MD;  Location: [facility];  Service: Cardiovascular;  Laterality: N/A;  . LEG SURGERY Left 2000   DUE TO BROKE LEG   Family History  Problem Relation Age of Onset  . Hypertension Mother 5  . Thyroid disease Mother   . Healthy Father 67    Social History   Tobacco Use  . Smoking status: Never Smoker  . Smokeless tobacco: Never Used  Substance Use Topics  . Alcohol use: No    Marital Status: Divorced  ROS:   Review of Systems  Constitutional: Negative for malaise/fatigue.  Cardiovascular: Positive for dyspnea on exertion (improved). Negative for chest pain and leg swelling.  Genitourinary: Positive for frequency and hesitancy.   Objective:  Blood pressure 117/69, pulse 74, resp. rate 17, height 6' (1.829 m), weight 169 lb (76.7 kg), SpO2 97 %. Body mass index is 22.92 kg/m.  Vitals with BMI 08/10/2019 06/26/2019 06/18/2019  Height 6' 0"  6' 0"  6' 0"   Weight 169 lbs 165 lbs 165 lbs  BMI 22.92 22.37 22.37  Systolic 858 92 850  Diastolic 69 58 64  Pulse 74 70 70     Physical Exam Vitals reviewed.  Constitutional:      Appearance: He is well-developed.  Cardiovascular:     Rate and Rhythm: Normal rate and regular rhythm.     Pulses: Normal pulses and intact distal pulses.     Heart sounds: Normal heart sounds. No murmur heard.  No gallop.      Comments: NO JVD, No leg edema. Pulmonary:     Effort: Pulmonary effort is normal. No accessory muscle usage or respiratory distress.     Breath sounds: Normal breath  sounds.  Abdominal:     General: Bowel sounds are normal.     Palpations: Abdomen is soft.    Laboratory examination:   CMP Latest Ref Rng & Units 07/13/2019 06/22/2019 05/18/2019  Glucose 65 - 99 mg/dL 104(H) 105(H) 86  BUN 8 - 27 mg/dL 42(H) 56(H) 48(H)  Creatinine 0.76 - 1.27 mg/dL 1.56(H) 1.94(H) 1.72(H)  Sodium 134 - 144 mmol/L 146(H) 142 141  Potassium 3.5 - 5.2 mmol/L 4.3 4.9 4.1  Chloride 96 - 106 mmol/L 105 100 100  CO2 20 - 29 mmol/L 26 27 25   Calcium 8.6 - 10.2 mg/dL 9.6 9.5 9.2  Total Protein 6.0 - 8.5 g/dL - - -  Total Bilirubin 0.0 - 1.2 mg/dL - - -  Alkaline Phos 39 - 117 IU/L - - -  AST 0 - 40 IU/L - - -  ALT 0 - 44 IU/L - - -   CBC Latest Ref Rng & Units 02/25/2018  WBC 3.4 - 10.8 x10E3/uL 5.2  Hemoglobin 13.0 - 17.7 g/dL 14.1  Hematocrit 37.5 - 51.0 % 43.3  Platelets 150 - 450 x10E3/uL 137(L)   Lipid Panel     Component Value Date/Time   CHOL 179 03/20/2019 0804   TRIG 61 03/20/2019 0804   HDL 51 03/20/2019 0804   LDLCALC 116 (H) 03/20/2019 0804   HEMOGLOBIN A1C No results found for: HGBA1C, MPG TSH Recent Labs    03/20/19 0804 04/24/19 0932  TSH 4.720* 3.360    BNP (last 3 results) Recent Labs    05/18/19 0943 06/22/19 0916 07/13/19 1031  BNP 1,026.4* 631.8* 611.9*   Labs not auto populated  into epic 06/22/2019:  Serum glucose 105, BUN 56, creatinine 1.94, EGFR 33 mL.  Sodium 142, potassium 4.9.  BNP 631.  PRN Meds:. There are no discontinued medications. Current Meds  Medication Sig  . carvedilol (COREG) 3.125 MG tablet TAKE 1 TABLET BY MOUTH TWICE DAILY WITH MEALS  . losartan (COZAAR) 25 MG tablet Take 1 tablet (25 mg total) by mouth daily.  . magnesium oxide (MAG-OX) 400 MG tablet Take 400 mg by mouth 2 (two) times daily.  . tamsulosin (FLOMAX) 0.4 MG CAPS capsule Take 0.4 mg by mouth daily.   Marland Kitchen torsemide (DEMADEX) 20 MG tablet TAKE 1 TABLET(20 MG) BY MOUTH TWICE DAILY AS DIRECTED    Radiology:   No results found.  Cardiac  Studies:   Exercise sestamibi stress test 08/27/2014: 1. The resting electrocardiogram demonstrated normal sinus rhythm, incomplete LBBB and no resting arrhythmias. ST segment depression and T-wave inversion in inferior and lateral leads. Stress EKG is negative for ischemia. The patient performed treadmill exercise using a Bruce protocol, completing 6:26 minutes. Thre were occasional PVCs. The patient completed an estimated workload of 7.65 METS, 95% of the maximum predicted heart rate. The stress test was terminated because of dyspnea.  2. The LV is markedly dilated both at rest and stress images. The LV end diastolic volume was 956LO. Thre is anterior wall thinning. There is no evidence of ischemia. Thre is severe generalized hypokinesis and LVEF markedly depressed at 11%. Findings are consistent with dilated cardiomyopathy. Clinical correlation is recommended.  Cardiac MRI 11/20/2017: 1. Severely dilated left ventricle with normal wall thickness and severely decreased systolic function (LVEF = 14%). There is severe diffuse hypokinesis and paradoxical septal motion consistent with LBBB. The ratio of non-compacted to compacted myocardium in the apical and mid portions of the ventricle is > 4:1. There is midwall late gadolinium enhancement in the basal anteroseptal and inferoseptal myocardium. 2. Normal right ventricular size, thickness and systolic function (LVEF = 55%). There are no regional wall motion abnormalities. 3. Severely dilated left atrium (59 mm), normal right atrial size. 4. Moderately dilated pulmonary artery measuring 36 mm. 5. Moderate aortic and mitral regurgitation, mild tricuspid regurgitation. 6. Mild pericardial effusion. Collectively, these findings are consistent with end stage non-compaction cardiomyopathy.  Echocardiogram 03/05/2019:  Severely depressed LV systolic function with visual EF <20%. Severe global  hypokinesis Left ventricle cavity is dilated. Dilated  cardiomyopathy.  Doppler evidence of grade III (restrictive) diastolic dysfunction, elevated LAP. Calculated EF 11%.  Left atrial cavity is severely dilated. Interatrial septum bulges to the  right suggests elevated left atrial pressure.  Moderate aortic regurgitation.  Moderate to severe mitral regurgitation.  Moderate tricuspid regurgitation. Moderate pulmonary hypertension. RVSP  measures 59 mmHg.  Mild pulmonic regurgitation.  Moderate size pericardial effusion with no hemodynamic compromise.  IVC is dilated with a respiratory response of <50%.  Prior study dated 07/2018, 08/15/2017, no significant change.  Nocturnal oximetry 05/20/2019: SpO2 <88%:  53 minutes SpO2 <89%:  77 minutes Highest SPO2 99% and lowest SPO2 76%.  Oxygen desaturation events 225, desaturation index 25. Impression: Patient qualifies for nocturnal oxygen supplementation per Medicare guidelines Group 1.  EKG:  03/18/1019: Probably AV paced rhythm with first-degree AV block, biventricular pacemaker detected.  No further analysis. Single PVC.  Assessment:     ICD-10-CM   1. Chronic combined systolic and diastolic heart failure (HCC)  I50.42 AMB referral to CHF clinic  2. Nonischemic cardiomyopathy (HCC)  I42.8 AMB referral to CHF clinic  3. Ventricular ectopy - frequent  I49.3   4. Left bundle branch block  I44.7   5. ICD- Biventricular Medtronic ICD Claria Mri [id number] in situ 02/28/18  Z95.810   6. Preoperative cardiovascular examination -  TURP  Z01.810     No orders of the defined types were placed in this encounter.  There are no discontinued medications.   Recommendations:   MIRKO TAILOR  is a 74 y.o. male  from [place]. He has severe non ischemic dilated cardiomyopathy with severely depressed left-ventricular systolic function, Ejection fraction has been in teens for past 3 years in spite of optimal medical therapy. 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 Underwent BIV ICD CRT insertion on 02/28/2018 with Dr. Caryl Comes for nonischemic cardiomyopathy.    He was evaluated by Dr. Adam Phenix on 06/02/2019, he did change his BiV ICD settings from adaptive CRT to turning on biventricular pacing.  His Entresto was also discontinued and switched to low-dose losartan in view of marked dizziness and low blood pressure.   He now presents here for a 6-week office visit.  He is presently doing  well, he is back to exercising daily and walking 4 miles a day and also muscle strengthening exercises.  He is not in acute  decompensated heart failure, he has been scheduled for prostate surgery, this was postponed about 2 to 3 months ago but he is severely symptomatic and as he is stable from cardiac standpoint for the past 2 months, he can go through the surgery with low to moderate risk.  We may have to watch him for development of fluid overload state.  I have again discussed with him and his daughter regarding being careful with excessive fluids and also salt.  He has been very careful with his diet.  We will continue with low-dose of losartan for now and do not wish to change him to [facility] as he developed severe and also hypotension.  He was evaluated by Dr. Asencion Partridge Dohmeier for nocturnal hypoxemia and found to have severe central and obstructive sleep apnea, he is now scheduled for titration study tonight.  Labs are looking good, BNP has stabilized and is trending down, will continue to repeat BMP and BNP in 2 weeks. ICD is being followed by Dr. Caryl Comes for now. OV in 6 weeks.   He should be able to proceed with TURP at moderate risk for CHF. He has severe symptoms and  disturbed sleep due to frequency of urination and incomplete voiding and is affecting his lifestyle.  Although 74 years of age, he is relatively in good shape and I will refer him to be evaluated for possible  heart transplant, will refer him for heart failure clinic ([facility]).   Adrian Prows, MD, [facility] 08/10/2019, 12:31 PM Office: [phone]   CC: Drs. Ron Rosana Hoes and Jolyn Nap.

## 2019-08-11 DIAGNOSIS — R063 Periodic breathing: Secondary | ICD-10-CM | POA: Insufficient documentation

## 2019-08-11 NOTE — Progress Notes (Signed)
DIAGNOSIS 1. Cyclic- Cheyne stokes Sleep Apnea responded to ASV after failing CPAP and BiPAP.   PLANS/RECOMMENDATIONS: Starting ASV 1. Any apnea patient should avoid sedatives, hypnotics, and alcohol consumption at bedtime. 2. CPAP therapy compliance is defined as 4 hours or more of of nightly use.    DISCUSSION: ASV settings of 16 maximum pressure support with 4 cm minimum pressure support and 10 cm water EPAP will be ordered. The patient was fitted with a ResMed AirFit F30i medium mask.

## 2019-08-11 NOTE — Procedures (Signed)
PATIENT'S NAME:  Miguel Patterson, Pelc DOB:      Oct 16, 1945      MR#:    [id number]     DATE OF RECORDING: 08/10/2019 REFERRING M.D.:  Kathryne Eriksson MD Study Performed:   Titration to positive airway pressure HISTORY:  Miguel Patterson is a defibrillator patient and returning to [place] sleep lab for PAP titration following his last sleep study/PSG performed on 06/30/2019, which resulted in an AHI off 83.1/D. with cyclic breathing and primarily mixed respiratory events and low spo2 of 81%. The overall desaturation time was 75 minutes. .  The patient endorsed the Epworth Sleepiness Scale at 2 points. The patient's weight 165 pounds with a height of 72 (inches), resulting in a BMI of 22.4 kg/m2. The patient's neck circumference measured 14 inches.  CURRENT MEDICATIONS: Coreg, Cozaar, Flomax, Demadex   PROCEDURE:  This is a multichannel digital polysomnogram utilizing the SomnoStar 11.2 system.  Electrodes and sensors were applied and monitored per AASM Specifications.   EEG, EOG, Chin and Limb EMG, were sampled at 200 Hz.  ECG, Snore and Nasal Pressure, Thermal Airflow, Respiratory Effort, CPAP Flow and Pressure, Oximetry was sampled at 50 Hz. Digital video and audio were recorded.       BiPAP was initiated under use of a F30I medium mask by ResMed- at 9/5 cmH20 with heated humidity per AASM split night standards and pressure was advanced to 9/5 cm with ST 10, back up rate, because of hypopneas, apneas and desaturations. AHI became 55.4/h. 13/9 cm water over 10 achieved a reduction to 19.7/h. The technologist changed to Adapt SV- first setting was 15/7 and 3 cm minimum pressure support- At ASV-PAP pressure of 16/10/ 4 cmH20, there was a reduction of the AHI to 0.0 with improvement of sleep apnea over a sleep period of 92.5 minutes. Waunita Schooner Out was at 21:58 and Lights On at 05:14. Total recording time (TRT) was 436.5 minutes, with a total sleep time (TST) of 313.5 minutes. The patient's sleep latency was 85  minutes. REM latency was 89 minutes.  The sleep efficiency was 71.8 %.    SLEEP ARCHITECTURE: WASO (Wake after sleep onset) was 113 minutes.  There were 42.5 minutes in Stage N1, 94.5 minutes Stage N2, 78 minutes Stage N3 and 98.5 minutes in Stage REM.  The percentage of Stage N1 was 13.6%, Stage N2 was 30.1%, Stage N3 was 24.9% and Stage R (REM sleep) was 31.4%.   RESPIRATORY ANALYSIS:  There was a total of 52 respiratory events: 0 obstructive apneas, 15 central apneas and 36 mixed apneas with a total of 51 apneas and an apnea index (AI) of 9.8 /hour. There was 1 hypopnea.      The total APNEA/HYPOPNEA INDEX  (AHI) was 10.0 /hour.  4 events occurred in REM sleep and 48 events in NREM. The REM AHI was 2.4 /hour versus a non-REM AHI of 13.4 /hour.  The patient spent 249 minutes of total sleep time in the supine position and 65 minutes in non-supine. The supine AHI was 12.5/h, versus a non-supine AHI of 0.0.  OXYGEN SATURATION & C02:  The baseline 02 saturation was 96%, with the lowest being 82%. Time spent below 89% saturation equaled 7 minutes.  The arousals were noted as: 57 were spontaneous, 0 were associated with PLMs, 37 were associated with respiratory events.  EKG was highly abnormal, see screen shots- PVCs and PACs, V-tach and couplets were seen.    DIAGNOSIS 1. Cyclic- Cheyne stokes Sleep Apnea responded  to ASV after failing CPAP and BiPAP.   PLANS/RECOMMENDATIONS: Starting ASV 1. Any apnea patient should avoid sedatives, hypnotics, and alcohol consumption at bedtime. 2. CPAP therapy compliance is defined as 4 hours or more of of nightly use.    DISCUSSION: ASV settings of 16 maximum pressure support with 4 cm minimum pressure support and 10 cm water EPAP will be ordered. The patient was fitted with a ResMed AirFit F30i medium mask.    A follow up appointment will be scheduled in the Sleep Clinic at [facility].   Please call [phone] with any questions.       I certify that I have reviewed the entire raw data recording prior to the issuance of this report in accordance with the Standards of Accreditation of the American Academy of Sleep Medicine (AASM)    Larey Seat, M.D. Diplomat, Tax adviser of Psychiatry and Neurology  Diplomat, Tax adviser of Sleep Medicine Market researcher, [employer] [facility] at [employer]

## 2019-08-11 NOTE — Progress Notes (Signed)
Thank you Asencion Partridge. Appreciate you working on my patients

## 2019-08-11 NOTE — Addendum Note (Signed)
Addended by: Larey Seat on: 08/11/2019 10:59 AM   Modules accepted: Orders

## 2019-08-12 ENCOUNTER — Telehealth: Payer: Self-pay | Admitting: Neurology

## 2019-08-12 NOTE — Telephone Encounter (Signed)
I called pt. I advised pt that Dr. Brett Fairy reviewed their sleep study results and found that pt tolerated the ASV machine. Dr. Brett Fairy recommends that pt patient starts ASV. I reviewed PAP compliance expectations with the pt. Pt is agreeable to starting a CPAP. I advised pt that an order will be sent to a DME, [facility] ([facility] and [facility] ([place]) will call the pt within about one week after they file with the pt's insurance. [facility]) will show the pt how to use the machine, fit for masks, and troubleshoot the CPAP if needed. A follow up appt was made for insurance purposes with Dr. Brett Fairy on Nov 8,2021 at 9:30 am . Pt verbalized understanding to arrive 15 minutes early and bring their CPAP. A letter with all of this information in it will be mailed to the pt as a reminder. I verified with the pt that the address we have on file is correct. Pt verbalized understanding of results. Pt had no questions at this time but was encouraged to call back if questions arise. I have sent the order to [facility]) and have received confirmation that they have received the order.

## 2019-08-12 NOTE — Telephone Encounter (Signed)
-----   Message from Larey Seat, MD sent at 08/11/2019 10:59 AM EDT ----- DIAGNOSIS 1. Cyclic- Cheyne stokes Sleep Apnea responded to ASV after failing CPAP and BiPAP.   PLANS/RECOMMENDATIONS: Starting ASV 1. Any apnea patient should avoid sedatives, hypnotics, and alcohol consumption at bedtime. 2. CPAP therapy compliance is defined as 4 hours or more of of nightly use.    DISCUSSION: ASV settings of 16 maximum pressure support with 4 cm minimum pressure support and 10 cm water EPAP will be ordered. The patient was fitted with a ResMed AirFit F30i medium mask.

## 2019-08-14 DIAGNOSIS — R8290 Unspecified abnormal findings in urine: Secondary | ICD-10-CM | POA: Diagnosis not present

## 2019-08-14 DIAGNOSIS — N138 Other obstructive and reflux uropathy: Secondary | ICD-10-CM | POA: Diagnosis not present

## 2019-08-14 DIAGNOSIS — N401 Enlarged prostate with lower urinary tract symptoms: Secondary | ICD-10-CM | POA: Diagnosis not present

## 2019-08-24 NOTE — Progress Notes (Signed)
Please cancel nocturnal O2 that I had ordered. Thanks guys for team work.

## 2019-08-24 NOTE — Telephone Encounter (Signed)
Called the patient because I had received a message from [place] indicating the patient was using oxygen at night time and if he is to continue the oxygen he would need to go with the company that is handling his oxygen. I called the patient and he is only wearing oxygen at night time. Advised that during our sleep study after he was treated with the ASV machine he no longer requires oxygen. Advised the patient that he would need to have Dr Einar Gip dc the oxygen so that he may have [employer] pick up his oxygen machine. Pt would like to continue as planned with [facility] providing the ASV machine. Informed I would make sure that Dr Einar Gip received his sleep study results. Pt verbalized understanding.

## 2019-09-03 DIAGNOSIS — R063 Periodic breathing: Secondary | ICD-10-CM | POA: Diagnosis not present

## 2019-09-03 DIAGNOSIS — N138 Other obstructive and reflux uropathy: Secondary | ICD-10-CM | POA: Diagnosis not present

## 2019-09-03 DIAGNOSIS — N401 Enlarged prostate with lower urinary tract symptoms: Secondary | ICD-10-CM | POA: Diagnosis not present

## 2019-09-03 DIAGNOSIS — Z0000 Encounter for general adult medical examination without abnormal findings: Secondary | ICD-10-CM | POA: Diagnosis not present

## 2019-09-03 DIAGNOSIS — M65331 Trigger finger, right middle finger: Secondary | ICD-10-CM | POA: Diagnosis not present

## 2019-09-03 NOTE — Progress Notes (Signed)
Sent message to [place] to cx order

## 2019-09-15 DIAGNOSIS — G4731 Primary central sleep apnea: Secondary | ICD-10-CM | POA: Diagnosis not present

## 2019-09-15 DIAGNOSIS — N189 Chronic kidney disease, unspecified: Secondary | ICD-10-CM | POA: Diagnosis not present

## 2019-09-15 DIAGNOSIS — I083 Combined rheumatic disorders of mitral, aortic and tricuspid valves: Secondary | ICD-10-CM | POA: Diagnosis not present

## 2019-09-15 DIAGNOSIS — N138 Other obstructive and reflux uropathy: Secondary | ICD-10-CM | POA: Diagnosis not present

## 2019-09-15 DIAGNOSIS — Z23 Encounter for immunization: Secondary | ICD-10-CM | POA: Diagnosis not present

## 2019-09-15 DIAGNOSIS — R350 Frequency of micturition: Secondary | ICD-10-CM | POA: Diagnosis not present

## 2019-09-15 DIAGNOSIS — I272 Pulmonary hypertension, unspecified: Secondary | ICD-10-CM | POA: Diagnosis not present

## 2019-09-15 DIAGNOSIS — N401 Enlarged prostate with lower urinary tract symptoms: Secondary | ICD-10-CM | POA: Diagnosis not present

## 2019-09-15 DIAGNOSIS — I440 Atrioventricular block, first degree: Secondary | ICD-10-CM | POA: Diagnosis not present

## 2019-09-15 DIAGNOSIS — I429 Cardiomyopathy, unspecified: Secondary | ICD-10-CM | POA: Diagnosis not present

## 2019-09-16 DIAGNOSIS — N138 Other obstructive and reflux uropathy: Secondary | ICD-10-CM | POA: Diagnosis not present

## 2019-09-16 DIAGNOSIS — R350 Frequency of micturition: Secondary | ICD-10-CM | POA: Diagnosis not present

## 2019-09-16 DIAGNOSIS — Z23 Encounter for immunization: Secondary | ICD-10-CM | POA: Diagnosis not present

## 2019-09-16 DIAGNOSIS — I440 Atrioventricular block, first degree: Secondary | ICD-10-CM | POA: Diagnosis not present

## 2019-09-16 DIAGNOSIS — N401 Enlarged prostate with lower urinary tract symptoms: Secondary | ICD-10-CM | POA: Diagnosis not present

## 2019-09-16 DIAGNOSIS — I429 Cardiomyopathy, unspecified: Secondary | ICD-10-CM | POA: Diagnosis not present

## 2019-10-02 ENCOUNTER — Ambulatory Visit (INDEPENDENT_AMBULATORY_CARE_PROVIDER_SITE_OTHER): Payer: Medicare Other

## 2019-10-02 DIAGNOSIS — I5042 Chronic combined systolic (congestive) and diastolic (congestive) heart failure: Secondary | ICD-10-CM

## 2019-10-04 LAB — CUP PACEART REMOTE DEVICE CHECK
Battery Remaining Longevity: 99 mo
Battery Voltage: 3.01 V
Brady Statistic AP VP Percent: 85.67 %
Brady Statistic AP VS Percent: 0.17 %
Brady Statistic AS VP Percent: 13.58 %
Brady Statistic AS VS Percent: 0.59 %
Brady Statistic RA Percent Paced: 81.4 %
Brady Statistic RV Percent Paced: 89.62 %
Date Time Interrogation Session: 20211001012504
HighPow Impedance: 65 Ohm
Implantable Lead Implant Date: 20200228
Implantable Lead Implant Date: 20200228
Implantable Lead Implant Date: 20200228
Implantable Lead Location: 753858
Implantable Lead Location: 753859
Implantable Lead Location: 753860
Implantable Lead Model: 5076
Implantable Pulse Generator Implant Date: 20200228
Lead Channel Impedance Value: 184.154
Lead Channel Impedance Value: 184.154
Lead Channel Impedance Value: 188.1 Ohm
Lead Channel Impedance Value: 199.5 Ohm
Lead Channel Impedance Value: 204.14 Ohm
Lead Channel Impedance Value: 304 Ohm
Lead Channel Impedance Value: 342 Ohm
Lead Channel Impedance Value: 361 Ohm
Lead Channel Impedance Value: 399 Ohm
Lead Channel Impedance Value: 399 Ohm
Lead Channel Impedance Value: 418 Ohm
Lead Channel Impedance Value: 456 Ohm
Lead Channel Impedance Value: 646 Ohm
Lead Channel Impedance Value: 665 Ohm
Lead Channel Impedance Value: 665 Ohm
Lead Channel Impedance Value: 703 Ohm
Lead Channel Impedance Value: 703 Ohm
Lead Channel Impedance Value: 722 Ohm
Lead Channel Pacing Threshold Amplitude: 0.5 V
Lead Channel Pacing Threshold Amplitude: 1 V
Lead Channel Pacing Threshold Amplitude: 1.25 V
Lead Channel Pacing Threshold Pulse Width: 0.4 ms
Lead Channel Pacing Threshold Pulse Width: 0.4 ms
Lead Channel Pacing Threshold Pulse Width: 0.4 ms
Lead Channel Sensing Intrinsic Amplitude: 0.75 mV
Lead Channel Sensing Intrinsic Amplitude: 0.75 mV
Lead Channel Sensing Intrinsic Amplitude: 13.5 mV
Lead Channel Sensing Intrinsic Amplitude: 13.5 mV
Lead Channel Setting Pacing Amplitude: 1 V
Lead Channel Setting Pacing Amplitude: 1.75 V
Lead Channel Setting Pacing Amplitude: 2 V
Lead Channel Setting Pacing Pulse Width: 0.4 ms
Lead Channel Setting Pacing Pulse Width: 0.4 ms
Lead Channel Setting Sensing Sensitivity: 0.3 mV

## 2019-10-05 NOTE — Progress Notes (Signed)
Remote ICD transmission.   

## 2019-10-09 ENCOUNTER — Other Ambulatory Visit: Payer: Self-pay

## 2019-10-09 ENCOUNTER — Telehealth (HOSPITAL_COMMUNITY): Payer: Self-pay | Admitting: Pharmacy Technician

## 2019-10-09 ENCOUNTER — Ambulatory Visit (HOSPITAL_COMMUNITY)
Admission: RE | Admit: 2019-10-09 | Discharge: 2019-10-09 | Disposition: A | Discharge status: Home / Self Care | Payer: Medicare Other | Source: Ambulatory Visit | Attending: Internal Medicine | Admitting: Internal Medicine

## 2019-10-09 ENCOUNTER — Encounter (HOSPITAL_COMMUNITY): Payer: Self-pay | Admitting: Internal Medicine

## 2019-10-09 ENCOUNTER — Ambulatory Visit: Payer: Self-pay

## 2019-10-09 ENCOUNTER — Other Ambulatory Visit (HOSPITAL_COMMUNITY): Payer: Self-pay | Admitting: Internal Medicine

## 2019-10-09 VITALS — BP 106/60 | HR 64 | Ht 72.0 in | Wt 166.8 lb

## 2019-10-09 DIAGNOSIS — I5042 Chronic combined systolic (congestive) and diastolic (congestive) heart failure: Secondary | ICD-10-CM | POA: Diagnosis not present

## 2019-10-09 DIAGNOSIS — I493 Ventricular premature depolarization: Secondary | ICD-10-CM | POA: Diagnosis not present

## 2019-10-09 DIAGNOSIS — N1832 Chronic kidney disease, stage 3b: Secondary | ICD-10-CM | POA: Insufficient documentation

## 2019-10-09 DIAGNOSIS — Z79899 Other long term (current) drug therapy: Secondary | ICD-10-CM | POA: Insufficient documentation

## 2019-10-09 DIAGNOSIS — I428 Other cardiomyopathies: Secondary | ICD-10-CM

## 2019-10-09 DIAGNOSIS — I447 Left bundle-branch block, unspecified: Secondary | ICD-10-CM

## 2019-10-09 DIAGNOSIS — I5022 Chronic systolic (congestive) heart failure: Secondary | ICD-10-CM

## 2019-10-09 DIAGNOSIS — Z95810 Presence of automatic (implantable) cardiac defibrillator: Secondary | ICD-10-CM | POA: Diagnosis not present

## 2019-10-09 DIAGNOSIS — I5023 Acute on chronic systolic (congestive) heart failure: Secondary | ICD-10-CM

## 2019-10-09 MED ORDER — TORSEMIDE 20 MG PO TABS
20.0000 mg | ORAL_TABLET | Freq: Every day | ORAL | 2 refills | Status: DC
Start: 2019-10-09 — End: 2020-03-23

## 2019-10-09 MED ORDER — DAPAGLIFLOZIN PROPANEDIOL 10 MG PO TABS: 10.0000 mg | ORAL_TABLET | Freq: Every day | ORAL | 3 refills | Status: DC

## 2019-10-09 NOTE — Telephone Encounter (Signed)
Sent in application via fax.  Will follow up.  

## 2019-10-09 NOTE — Addendum Note (Signed)
Encounter addended by: Jolaine Artist, MD on: 10/09/2019 12:55 PM  Actions taken: Charge Capture section accepted

## 2019-10-09 NOTE — Addendum Note (Signed)
Encounter addended by: Scarlette Calico, RN on: 10/09/2019 1:13 PM  Actions taken: Clinical Note Signed

## 2019-10-09 NOTE — Telephone Encounter (Signed)
Patient was seen in clinic today and started on Farxiga. The patient does not currently have prescription insurance. Started an application for [place]. Will fax in once signatures obtained.   Will follow up.

## 2019-10-09 NOTE — Progress Notes (Signed)
ADVANCED HF CLINIC CONSULT NOTE  Referring Physician: Dr. Einar Patterson  Primary Care: Miguel Sacramento, MD Primary Cardiologist: Miguel Patterson EP: Miguel Patterson  HPI:  Miguel Patterson  is a 74 y.o. male from [place] with chronic systolic HF and TDV7O (Creatinine 1.5-1.9) referred by Dr. Einar Patterson to discuss candidacy for advanced HF therapies.   Mr. Miguel Patterson has a fairly limited PMHx. No previous h/o HTN or known CAD.   He first developed HF symptoms in 2016 and saw his PCP who referred him to Dr. Woody Patterson and then Dr. Einar Patterson.   He was referred to Miguel Patterson in 2019 for ICD consideration. According to Dr. Olin Patterson note from 10/14/17 initial echo in 2016 had EF 11% with anterior thinning. There were also frequent PVCs (~25% by ECG). At that time he did not have LBBB (IVCD). The question was raised about the possibility of Chag[place] disease or a genetic component. (he is 1 of 17 children and several family members are on heart medications). He was referred for a cMRI.   Echo in 8/19 read as EF < 15% mod AI/MR (I do not have images to review).   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 Underwent BIV ICD CRT insertion on 02/28/2018 with Miguel Patterson for nonischemic cardiomyopathy. In 06/02/2019, he did change his BiV ICD settings from adaptive CRT to turning on biventricular pacing. He does not recall ever having a cardiac cath.  Overall he has done very well. He walks/jogs everyday at least 4 miles and up to 7 miles. Can do 4 miles in just over an hour. No CP, SOB, orthopnea or PND. No LE edema.   That said, medical therapy for his HF has been limited by hypotension and severe dizziness. Entresto was discontinued and switched over to low-dose losartan.Takes BP 2-3x/day  In am BP often ~100. After exercising SBP 120s   Past Medical History:  Diagnosis Date  . Cardiomegaly   . Chronic  combined systolic and diastolic heart failure ([place]) 02/10/2018  . DCM (dilated cardiomyopathy) ([place])   . Encounter for adjustment of biventricular implantable cardioverter-defibrillator (ICD) 04/03/2019  . Fatigue   . ICD- Biventricular Medtronic ICD Claria Mri [id number] in situ 02/28/18 03/01/2018  . LBBB (left bundle branch block)   . Mild tricuspid regurgitation   . Moderate aortic regurgitation   . Moderate mitral regurgitation   . Pericardial effusion    INSIGNIFICANT  . S/P biventricular cardiac pacemaker procedure 02/28/18 with ICD MDT  03/01/2018  . Short of breath on exertion   . Systolic heart failure ([place])   . Trace pulmonic regurgitation by prior echocardiogram       Review of Systems: [y] = yes, [ ]  = no   General: Weight gain [ ] ; Weight loss [ ] ; Anorexia [ ] ; Fatigue [ ] ; Fever [ ] ; Chills [ ] ; Weakness [ ]   Cardiac: Chest pain/pressure [ ] ; Resting SOB [ ] ; Exertional SOB [ ] ; Orthopnea [ ] ; Pedal Edema [ ] ; Palpitations [ ] ; Syncope [ ] ; Presyncope [ ] ; Paroxysmal nocturnal dyspnea[ ]   Pulmonary: Cough [ ] ; Wheezing[ ] ; Hemoptysis[ ] ; Sputum [ ] ; Snoring [ ]   GI: Vomiting[ ] ; Dysphagia[ ] ; Melena[ ] ; Hematochezia [ ] ; Heartburn[ ] ; Abdominal pain [ ] ; Constipation [ ] ; Diarrhea [ ] ; BRBPR [ ]   GU: Hematuria[ ] ; Dysuria [ ] ; Nocturia[ ]   Vascular: Pain in legs with walking [ ] ;  Pain in feet with lying flat [ ] ; Non-healing sores [ ] ; Stroke [ ] ; TIA [ ] ; Slurred speech [ ] ;  Neuro: Headaches[ ] ; Vertigo[ ] ; Seizures[ ] ; Paresthesias[ ] ;Blurred vision [ ] ; Diplopia [ ] ; Vision changes [ ]   Ortho/Skin: Arthritis Blue.Reese ]; Joint pain [ ] ; Muscle pain [ ] ; Joint swelling [ ] ; Back Pain [ ] ; Rash [ ]   Psych: Depression[ ] ; Anxiety[ ]   Heme: Bleeding problems [ ] ; Clotting disorders [ ] ; Anemia [ ]   Endocrine: Diabetes [ ] ; Thyroid dysfunction[ ]    Past Medical History:  Diagnosis Date  . Cardiomegaly   . Chronic combined systolic and diastolic heart failure ([place]) 02/10/2018  .  DCM (dilated cardiomyopathy) ([place])   . Encounter for adjustment of biventricular implantable cardioverter-defibrillator (ICD) 04/03/2019  . Fatigue   . ICD- Biventricular Medtronic ICD Claria Mri [id number] in situ 02/28/18 03/01/2018  . LBBB (left bundle branch block)   . Mild tricuspid regurgitation   . Moderate aortic regurgitation   . Moderate mitral regurgitation   . Pericardial effusion    INSIGNIFICANT  . S/P biventricular cardiac pacemaker procedure 02/28/18 with ICD MDT  03/01/2018  . Short of breath on exertion   . Systolic heart failure ([place])   . Trace pulmonic regurgitation by prior echocardiogram     Current Outpatient Medications  Medication Sig Dispense Refill  . carvedilol (COREG) 3.125 MG tablet TAKE 1 TABLET BY MOUTH TWICE DAILY WITH MEALS 180 tablet 0  . finasteride (PROSCAR) 5 MG tablet Take 5 mg by mouth daily.    Marland Kitchen losartan (COZAAR) 25 MG tablet Take 1 tablet (25 mg total) by mouth daily. 90 tablet 3  . magnesium oxide (MAG-OX) 400 MG tablet Take 400 mg by mouth 2 (two) times daily.    . tamsulosin (FLOMAX) 0.4 MG CAPS capsule Take 0.4 mg by mouth daily.     Marland Kitchen torsemide (DEMADEX) 20 MG tablet TAKE 1 TABLET(20 MG) BY MOUTH TWICE DAILY AS DIRECTED 60 tablet 2   No current facility-administered medications for this encounter.    Allergies  Allergen Reactions  . Aspirin Other (See Comments)    Stomach pain       Social History   Socioeconomic History  . Marital status: Divorced    Spouse name: Not on file  . Number of children: 3  . Years of education: Not on file  . Highest education level: Not on file  Occupational History  . Not on file  Tobacco Use  . Smoking status: Never Smoker  . Smokeless tobacco: Never Used  Vaping Use  . Vaping Use: Never used  Substance and Sexual Activity  . Alcohol use: No  . Drug use: No  . Sexual activity: Not on file  Other Topics Concern  . Not on file  Social History Narrative  . Not on file   Social Determinants  of Health   Financial Resource Strain:   . Difficulty of Paying Living Expenses: Not on file  Food Insecurity:   . Worried About Charity fundraiser in the Last Year: Not on file  . Ran Out of Food in the Last Year: Not on file  Transportation Needs:   . Lack of Transportation (Medical): Not on file  . Lack of Transportation (Non-Medical): Not on file  Physical Activity:   . Days of Exercise per Week: Not on file  . Minutes of Exercise per Session: Not on file  Stress:   . Feeling of Stress : Not  on file  Social Connections:   . Frequency of Communication with Friends and Family: Not on file  . Frequency of Social Gatherings with Friends and Family: Not on file  . Attends Religious Services: Not on file  . Active Member of Clubs or Organizations: Not on file  . Attends Archivist Meetings: Not on file  . Marital Status: Not on file  Intimate Partner Violence:   . Fear of Current or Ex-Partner: Not on file  . Emotionally Abused: Not on file  . Physically Abused: Not on file  . Sexually Abused: Not on file      Family History  Problem Relation Age of Onset  . Hypertension Mother 63  . Thyroid disease Mother   . Healthy Father 32    Vitals:   10/09/19 0910  BP: 106/60  Pulse: 64  SpO2: 100%  Weight: 75.7 kg (166 lb 12.8 oz)  Height: 6' (1.829 m)    PHYSICAL EXAM: General:  Well appearing. No respiratory difficulty HEENT: normal Neck: supple. no JVD. Carotids 2+ bilat; no bruits. No lymphadenopathy or thryomegaly appreciated. Cor: PMI nondisplaced. Regular rate & rhythm. Soft AI. Lungs: clear Abdomen: soft, nontender, nondistended. No hepatosplenomegaly. No bruits or masses. Good bowel sounds. Extremities: no cyanosis, clubbing, rash, edema Neuro: alert & oriented x 3, cranial nerves grossly intact. moves all 4 extremities w/o difficulty. Affect pleasant.  ECG:  AV paced 70 (LV pacing) no PVCs Personally reviewed  ICD interrogation: No VT/AF. Fluid ok.  Activity level ~6hr day. 100% biv pacing (no PVC count provided)  Personally reviewed   ASSESSMENT & PLAN:  1. 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  - etiology remains unclear. 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 He also has a h/o LBBB but his CM predated the development of LBBB so doubt it can be implicated. Previously also has had frequent PVCs but not present today - s/p MDT CRT-D - currently NYHA I with excellent exercise capacity despite severe CM  - volume status looks good on torsemide 20 bid  Recs:  - continue losartan 25mg  daily (unable to tolerate Entresto due to hypotension) - continue carvedilol 3.125 bid - Add Farxiga 10 (decrease torsemide to 20 daily) - Consider spiro in future as BP tolerates - We discussed the role of advanced therapies (translant and VAD). Given age he does not qualify for transplant (cutoff 74 y/o) and functional capacity also too good for VAD or transplant consideration - Have requested repeat echo with Dr. Einar Patterson to assess for EF improvement with turning on CRT - Will get CPX to objectively confirm functional capacity  - Refer to Dr. Broadus John for genetic evaluation - Quantify PVCs from device  2. Valvular heart disease - Mod AI/Mod MR - likely functional.  - will follow  3. TIW5Y - add Farxiga for nephroprotection   Total time spent 45 minutes. Over half that time spent discussing above.   Glori Bickers, MD  12:28 PM

## 2019-10-09 NOTE — Patient Instructions (Addendum)
START Wilder Glade 10mg  Daily  DECREASE Torsemide 20mg  Daily  Your physician has recommended that you have a cardiopulmonary stress test (CPX). CPX testing is a non-invasive measurement of heart and lung function. It replaces a traditional treadmill stress test. This type of test provides a tremendous amount of information that relates not only to your present condition but also for future outcomes. This test combines measurements of you ventilation, respiratory gas exchange in the lungs, electrocardiogram (EKG), blood pressure and physical response before, during, and following an exercise protocol.  Your physician has requested that you have an echocardiogram. Echocardiography is a painless test that uses sound waves to create images of your heart. It provides your doctor with information about the size and shape of your heart and how well your heart's chambers and valves are working. This procedure takes approximately one hour. There are no restrictions for this procedure.  You have been referred to Genetic Counseling. They will contact you to schedule an appointment.  If you have any questions or concerns before your next appointment please send [place] a message through [place] or call our office at [phone].    TO LEAVE A MESSAGE FOR THE NURSE SELECT OPTION 2, PLEASE LEAVE A MESSAGE INCLUDING: . YOUR NAME . DATE OF BIRTH . CALL BACK NUMBER . REASON FOR CALL**this is important as we prioritize the call backs  YOU WILL RECEIVE A CALL BACK THE SAME DAY AS LONG AS YOU CALL BEFORE 4:00 PM

## 2019-10-10 ENCOUNTER — Encounter (HOSPITAL_COMMUNITY): Payer: Self-pay

## 2019-10-10 ENCOUNTER — Other Ambulatory Visit: Payer: Self-pay | Admitting: Cardiology

## 2019-10-10 DIAGNOSIS — I428 Other cardiomyopathies: Secondary | ICD-10-CM

## 2019-10-10 DIAGNOSIS — I5042 Chronic combined systolic (congestive) and diastolic (congestive) heart failure: Secondary | ICD-10-CM

## 2019-10-12 ENCOUNTER — Encounter: Payer: Self-pay | Admitting: Neurology

## 2019-10-12 ENCOUNTER — Ambulatory Visit: Payer: Medicare Other | Admitting: Cardiology

## 2019-10-13 ENCOUNTER — Encounter (HOSPITAL_COMMUNITY): Payer: Self-pay

## 2019-10-16 ENCOUNTER — Other Ambulatory Visit: Payer: Self-pay

## 2019-10-16 ENCOUNTER — Ambulatory Visit: Payer: Self-pay | Admitting: Cardiology

## 2019-10-16 ENCOUNTER — Encounter: Payer: Self-pay | Admitting: Cardiology

## 2019-10-16 VITALS — BP 100/56 | HR 73 | Resp 16 | Ht 72.0 in | Wt 167.0 lb

## 2019-10-16 DIAGNOSIS — I447 Left bundle-branch block, unspecified: Secondary | ICD-10-CM | POA: Diagnosis not present

## 2019-10-16 DIAGNOSIS — I5042 Chronic combined systolic (congestive) and diastolic (congestive) heart failure: Secondary | ICD-10-CM

## 2019-10-16 DIAGNOSIS — Z95810 Presence of automatic (implantable) cardiac defibrillator: Secondary | ICD-10-CM

## 2019-10-16 DIAGNOSIS — I428 Other cardiomyopathies: Secondary | ICD-10-CM | POA: Diagnosis not present

## 2019-10-16 NOTE — Progress Notes (Signed)
Primary Physician:  [name], MD   Patient ID: Miguel Patterson, male    DOB: Dec 25, 1945, 74 y.o.   MRN: [id number]   Chief Complaint  Patient presents with  . Chronic combined systolic and diastolic heart failure  . Follow-up    2 month    HPI    Miguel Patterson  is a 74 y.o. male  from [place]. He has severe non ischemic dilated cardiomyopathy with severely depressed left-ventricular systolic function, Ejection fraction has been in teens for past 3 years in spite of optimal medical therapy. 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 Underwent BIV ICD CRT insertion on 02/28/2018 with Dr. Caryl Comes for nonischemic cardiomyopathy.   He was evaluated by Dr. Adam Phenix on 06/02/2019, he did change his BiV ICD settings from adaptive CRT to turning on biventricular pacing.  He was also evaluated by Dr. Haroldine Laws on 10/09/2019 who recommended continuing present medical therapy and starting Farxiga for heart failure along with close monitoring of LV systolic function and repeating an echocardiogram.  He also recommended genetic testing to exclude noncompaction.  Patient is also scheduled for a cardiopulmonary stress test.  Patient has been walking at least 3 to 4 miles a day now and since discontinuing Entresto and switching him to losartan due to hypotension, he has not had any further dizziness.  Also he has not had any further leg edema. He also underwent transurethral resection of the prostate on 09/15/2019 without periprocedural complications.  Past Medical History:  Diagnosis Date  . Cardiomegaly   . Chronic combined systolic and diastolic heart failure ([place]) 02/10/2018  . DCM (dilated cardiomyopathy) ([place])   . Encounter for adjustment of biventricular implantable cardioverter-defibrillator (ICD) 04/03/2019  . Fatigue   . ICD- Biventricular Medtronic  ICD Claria Mri [id number] in situ 02/28/18 03/01/2018  . LBBB (left bundle branch block)   . Mild tricuspid regurgitation   . Moderate aortic regurgitation   . Moderate mitral regurgitation   . Pericardial effusion    INSIGNIFICANT  . S/P biventricular cardiac pacemaker procedure 02/28/18 with ICD MDT  03/01/2018  . Short of breath on exertion   . Systolic heart failure ([place])   . Trace pulmonic regurgitation by prior echocardiogram     Past Surgical History:  Procedure Laterality Date  . BIV ICD INSERTION CRT-D N/A 02/28/2018   Procedure: BIV ICD INSERTION CRT-D;  Surgeon: Deboraha Sprang, MD;  Location: [facility];  Service: Cardiovascular;  Laterality: N/A;  . LEG SURGERY Left 2000   DUE TO BROKE LEG   Family History  Problem Relation Age of Onset  . Hypertension Mother 44  . Thyroid disease Mother   . Healthy Father 86    Social History   Tobacco Use  . Smoking status: Never Smoker  . Smokeless tobacco: Never Used  Substance Use Topics  . Alcohol use: No    Marital Status: Divorced  ROS:   Review of Systems  Constitutional: Negative for malaise/fatigue.  Cardiovascular: Positive for dyspnea on exertion (improved). Negative for chest pain and leg swelling.  Genitourinary: Negative for frequency and hesitancy.   Objective:  Blood pressure (!) 100/56, pulse 73, resp. rate 16, height 6' (1.829 m), weight 167 lb (75.8 kg), SpO2 97 %. Body mass index is 22.65 kg/m.  Vitals with BMI 10/16/2019 10/09/2019 08/10/2019  Height 6' 0"  6' 0"  6' 0"   Weight 167 lbs 166 lbs 13 oz 169 lbs  BMI 22.64 80.32 12.24  Systolic 825 003 704  Diastolic 56 60 69  Pulse 73 64 74     Physical Exam Vitals reviewed.  Constitutional:      Appearance: He is well-developed.  Cardiovascular:     Rate and Rhythm: Normal rate and regular rhythm.     Pulses: Normal pulses and intact distal pulses.     Heart sounds: Normal heart sounds. No murmur heard.  No gallop.      Comments: NO JVD, No  leg edema. Pulmonary:     Effort: Pulmonary effort is normal. No accessory muscle usage or respiratory distress.     Breath sounds: Normal breath sounds.  Abdominal:     General: Bowel sounds are normal.     Palpations: Abdomen is soft.    Laboratory examination:   CMP Latest Ref Rng & Units 07/13/2019 06/22/2019 05/18/2019  Glucose 65 - 99 mg/dL 104(H) 105(H) 86  BUN 8 - 27 mg/dL 42(H) 56(H) 48(H)  Creatinine 0.76 - 1.27 mg/dL 1.56(H) 1.94(H) 1.72(H)  Sodium 134 - 144 mmol/L 146(H) 142 141  Potassium 3.5 - 5.2 mmol/L 4.3 4.9 4.1  Chloride 96 - 106 mmol/L 105 100 100  CO2 20 - 29 mmol/L 26 27 25   Calcium 8.6 - 10.2 mg/dL 9.6 9.5 9.2  Total Protein 6.0 - 8.5 g/dL - - -  Total Bilirubin 0.0 - 1.2 mg/dL - - -  Alkaline Phos 39 - 117 IU/L - - -  AST 0 - 40 IU/L - - -  ALT 0 - 44 IU/L - - -   CBC Latest Ref Rng & Units 02/25/2018  WBC 3.4 - 10.8 x10E3/uL 5.2  Hemoglobin 13.0 - 17.7 g/dL 14.1  Hematocrit 37.5 - 51.0 % 43.3  Platelets 150 - 450 x10E3/uL 137(L)   Lipid Panel     Component Value Date/Time   CHOL 179 03/20/2019 0804   TRIG 61 03/20/2019 0804   HDL 51 03/20/2019 0804   LDLCALC 116 (H) 03/20/2019 0804   HEMOGLOBIN A1C No results found for: HGBA1C, MPG TSH Recent Labs    03/20/19 0804 04/24/19 0932  TSH 4.720* 3.360    BNP (last 3 results) Recent Labs    05/18/19 0943 06/22/19 0916 07/13/19 1031  BNP 1,026.4* 631.8* 611.9*   Labs not auto populated  into epic 06/22/2019:  Serum glucose 105, BUN 56, creatinine 1.94, EGFR 33 mL.  Sodium 142, potassium 4.9.  BNP 631.  PRN Meds:. There are no discontinued medications. Current Meds  Medication Sig  . carvedilol (COREG) 3.125 MG tablet TAKE 1 TABLET BY MOUTH TWICE DAILY WITH MEALS  . dapagliflozin propanediol (FARXIGA) 10 MG TABS tablet Take 1 tablet (10 mg total) by mouth daily before breakfast.  . finasteride (PROSCAR) 5 MG tablet Take 5 mg by mouth daily.  Marland Kitchen losartan (COZAAR) 25 MG tablet Take 1  tablet (25 mg total) by mouth daily.  . magnesium oxide (MAG-OX) 400 MG tablet Take 400 mg by mouth 2 (two) times daily.  . tamsulosin (FLOMAX) 0.4 MG CAPS capsule Take 0.4 mg by mouth daily.   Marland Kitchen torsemide (DEMADEX) 20 MG tablet Take 1 tablet (20 mg total) by mouth daily.    Radiology:   No results found.  Cardiac Studies:   Exercise sestamibi stress test 08/27/2014: 1. The resting electrocardiogram demonstrated normal sinus rhythm, incomplete LBBB and no resting arrhythmias. ST segment depression and T-wave inversion in inferior and lateral  leads. Stress EKG is negative for ischemia. The patient performed treadmill exercise using a Bruce protocol, completing 6:26 minutes. Thre were occasional PVCs. The patient completed an estimated workload of 7.65 METS, 95% of the maximum predicted heart rate. The stress test was terminated because of dyspnea. 2. The LV is markedly dilated both at rest and stress images. The LV end diastolic volume was 485IO. Thre is anterior wall thinning. There is no evidence of ischemia. Thre is severe generalized hypokinesis and LVEF markedly depressed at 11%. Findings are consistent with dilated cardiomyopathy. Clinical correlation is recommended.  Cardiac MRI 11/20/2017: 1. Severely dilated left ventricle with normal wall thickness and severely decreased systolic function (LVEF = 14%). There is severe diffuse hypokinesis and paradoxical septal motion consistent with LBBB. The ratio of non-compacted to compacted myocardium in the apical and mid portions of the ventricle is > 4:1. There is midwall late gadolinium enhancement in the basal anteroseptal and inferoseptal myocardium. 2. Normal right ventricular size, thickness and systolic function (LVEF = 55%). There are no regional wall motion abnormalities. 3. Severely dilated left atrium (59 mm), normal right atrial size. 4. Moderately dilated pulmonary artery measuring 36 mm. 5. Moderate aortic and mitral  regurgitation, mild tricuspid regurgitation. 6. Mild pericardial effusion. Collectively, these findings are consistent with end stage non-compaction cardiomyopathy.  Nocturnal oximetry 05/20/2019: SpO2 <88%:  53 minutes SpO2 <89%:  77 minutes Highest SPO2 99% and lowest SPO2 76%.  Oxygen desaturation events 225, desaturation index 25. Impression: Patient qualifies for nocturnal oxygen supplementation per Medicare guidelines Group 1.  Echocardiogram 10/09/2019: Severely depressed LV systolic function with visual EF 25-30%. Left ventricle cavity is severely dilated. Severe global hypokinesis.  Doppler evidence of grade II diastolic dysfunction, indeterminate LAP.  Left atrial cavity is severely dilated 70.19m/m2. Endocardial wire noted within the right cardiac chambers.  Mild to moderate aortic regurgitation. Moderate to severe mitral regurgitation. Moderate tricuspid regurgitation. Mild pulmonary hypertension. RVSP measures 48 mmHg. Mild pulmonic regurgitation. Moderate pericardial effusion. There is no hemodynamic significance. IVC is dilated with a respiratory response of >50%. Compared prior study 03/05/2019: LVEF has improved from <20% to 25-30%, Grade 3DD is now Grade 2 diastolic dysfunction, pulmonary hypertension per RVSP has improved,  otherwise no significant change.    EKG:  03/18/1019: Probably AV paced rhythm with first-degree AV block, biventricular pacemaker detected.  No further analysis. Single PVC.  Assessment:     ICD-10-CM   1. Chronic combined systolic and diastolic heart failure (HCC)  I50.42   2. ICD- Biventricular Medtronic ICD Claria Mri [id number] in situ 02/28/18  Z95.810   3. Nonischemic cardiomyopathy (HCC)  I42.8   4. Left bundle branch block  I44.7     No orders of the defined types were placed in this encounter.  There are no discontinued medications.   Recommendations:   Miguel Patterson is a 74y.o[place]  [place] He has severe non  ischemic dilated cardiomyopathy with severely depressed left-ventricular systolic function, Ejection fraction has been in teens for past 3 years in spite of optimal medical therapy. 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 Underwent BIV ICD CRT insertion on 02/28/2018 with Dr. KCaryl Comesfor nonischemic cardiomyopathy.    He was evaluated by Dr. SAdam Phenixon 06/02/2019, he did change his BiV ICD settings from adaptive CRT to turning on biventricular pacing.  His Entresto was also discontinued and switched  to low-dose losartan in view of marked dizziness and low blood pressure.   Patient has been walking at least 3 to 4 miles a day now and since discontinuing Entresto and switching him to losartan due to hypotension, he has not had any further dizziness.  Also he has not had any further leg edema. He also underwent transurethral resection of the prostate on 09/15/2019 without periprocedural complications.  Appreciate Dr. Pierre Bali for seeing the patient for his opinion, patient was started on Farxiga which he is tolerating.  I did not make any changes to his medication as patient is feeling the best he has in quite a while.    Echocardiogram reviewed and his LVEF is mildly improved.  Hopefully with continued present medical therapy his LVEF will remain stable.  I would like to see him back in 3 months for follow-up.    Miguel Prows, MD, [facility] 10/16/2019, 11:44 AM Office: [phone]   CC: Dr. Pierre Bali.

## 2019-10-17 ENCOUNTER — Other Ambulatory Visit: Payer: Self-pay | Admitting: Cardiology

## 2019-10-22 NOTE — Telephone Encounter (Signed)
Despite successful confirmation, [employer] states they have not received the patient's application.   Will re-fax and follow up. 

## 2019-10-29 NOTE — Telephone Encounter (Signed)
Called [employer] to check the status of the patient's application. Representative stated that there should be a determination by the end of the day.  Will follow up.

## 2019-11-02 DIAGNOSIS — N401 Enlarged prostate with lower urinary tract symptoms: Secondary | ICD-10-CM | POA: Diagnosis not present

## 2019-11-02 DIAGNOSIS — N138 Other obstructive and reflux uropathy: Secondary | ICD-10-CM | POA: Diagnosis not present

## 2019-11-02 NOTE — Telephone Encounter (Signed)
Advanced Heart Failure Patient Advocate Encounter   Patient was approved to receive Farxiga from [employer]  Patient ID: [id number] Effective dates: 10/29/19 through 10/27/20  His first shipment was sent out 10/29.

## 2019-11-02 NOTE — Telephone Encounter (Signed)
Called and left message regarding approval.  Charlann Boxer, CPhT

## 2019-11-05 ENCOUNTER — Ambulatory Visit (HOSPITAL_COMMUNITY): Payer: Medicare Other | Attending: Internal Medicine

## 2019-11-05 ENCOUNTER — Other Ambulatory Visit: Payer: Self-pay

## 2019-11-05 DIAGNOSIS — I5022 Chronic systolic (congestive) heart failure: Secondary | ICD-10-CM

## 2019-11-09 ENCOUNTER — Ambulatory Visit: Payer: Self-pay | Admitting: Neurology

## 2019-11-12 ENCOUNTER — Encounter (HOSPITAL_COMMUNITY): Payer: Self-pay

## 2019-11-12 ENCOUNTER — Other Ambulatory Visit: Payer: Self-pay

## 2019-11-12 ENCOUNTER — Ambulatory Visit: Payer: Medicare Other | Admitting: Genetic Counselor

## 2019-11-21 NOTE — Progress Notes (Signed)
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 He used to be a Theme park manager and used to jog 4 miles a day. He reports no functional limitations during his youth and adult until about 4 years ago when he began having shortness of breath. Imaging studies detected chronic systolic heart failure with a recent cardiac MRI demonstrating severely dilated LV, EF of 11% with end stage non-compaction cardiomyopathy. He had an ICD implanted in 2019. Lashan reports no symptoms and informs me that his cardiopulmonary test did not detect any abnormalities.    Family history Nahshon (III.9) has a healthy daughter, age 15 (IV.1) and two sons, age 74 (IV.2) and 46 (IV.3). His children are in great health as are his grandkids (V.1-3). Gradie comes from a large family of 16 siblings, most of whom are still in [place]. There are no reports of heart disease, as far as he is aware, or of sudden death or cardiomyopathy amongst his siblings.   Policarpos father (KX.38) died in his sleep at age 76. He tells me that his father went for a jog, came home watched TV and went to bed. He did not have any prior heart disease. His siblings (II.1-II.13) all lived to their 56s and died of old age. His mother (II.15) died of bronchitis at age 18. There is no history of heart disease amongst his maternal relatives, as far as he is aware.  Genetics Sergei was counseled on the genetics of isolated left ventricular noncompaction. I explained to him that LVNC can be associated with other syndromes such as Barth syndrome, mitochondrial disorders and  myotonic dystrophy, or concomitantly present with hypertrophic and dilated cardiomyopathy. However, the major genetic cause for familial noncompaction has yet to be identified. I explained to him that there is extreme variability of the LVNC morphological spectrum and that it may also occur as a benign anatomic variant of left ventricular structure instead of a type of heart disease. He verbalized understanding of this.   I discussed the genetics of LVNC and explained that depending upon the underlying cause it may have X-linked or autosomal inheritance. We walked through the process of genetic testing. Some studies have reported variants in sarcomeric genes in 29% to 41% of adult patients with LVNC, with a recent report showing only 9% yield of an actionable variant with LVNC genetic testing  Impression and Plan  In summary, Kyreese presents with chronic systolic heart failure likely from end stage non compaction cardiomyopathy  at age 48. In the absence of a family history of sudden death or a cardiomyopathy in his first-degree relatives, his cardiac condition is an isolated event. Genetic testing is most beneficial in LVNC associated with other cardiac and syndromic features to confirm the diagnosis and is least useful in adults with isolated LVNC without a family history. In light of this, the clinical utility of genetic testing is very low. He verbalized understanding of this and is not keen on pursuing genetic testing.   Please note that the patient has not been counseled in this visit on personal, cultural or ethical issues that he may face due to his heart condition.   Tadd Holtmeyer  Broadus John, Ph.D, [facility] Clinical Molecular Geneticist

## 2019-12-09 ENCOUNTER — Ambulatory Visit (INDEPENDENT_AMBULATORY_CARE_PROVIDER_SITE_OTHER): Payer: Medicare Other | Admitting: Neurology

## 2019-12-09 ENCOUNTER — Encounter: Payer: Self-pay | Admitting: Neurology

## 2019-12-09 VITALS — BP 103/67 | HR 70 | Ht 72.0 in | Wt 170.0 lb

## 2019-12-09 DIAGNOSIS — R063 Periodic breathing: Secondary | ICD-10-CM | POA: Diagnosis not present

## 2019-12-09 DIAGNOSIS — G4734 Idiopathic sleep related nonobstructive alveolar hypoventilation: Secondary | ICD-10-CM

## 2019-12-09 DIAGNOSIS — I5042 Chronic combined systolic (congestive) and diastolic (congestive) heart failure: Secondary | ICD-10-CM | POA: Diagnosis not present

## 2019-12-09 DIAGNOSIS — Z7189 Other specified counseling: Secondary | ICD-10-CM | POA: Insufficient documentation

## 2019-12-09 NOTE — Progress Notes (Signed)
SLEEP MEDICINE CLINIC    Provider:  Larey Seat, MD  Primary Care Physician:  [name], [place] [id number]     Referring Provider: Dr Einar Gip, MD         Chief Complaint according to patient   Patient presents with:    . New Patient (Initial Visit)     pt alone, rm 10. presents for the initial ASV machine visit. states he will be able to sleep 4-5 hrs straight with no problems but then shortly after that he wakes up with dry mouth. he made attempts of adjusting humifier settings to help but still a concern. overall he hates it. DME [employer]      INTERVAL HISTORY : 62/[birth year]149 Nitin C Couzens is a 74  Year- old [place]- Caucasian male patient following up after 3 sleep studies. Mr. Trinidad Curet carpal was first evaluated by a polysomnography given that I ordered and a split-night study.  He had a history of combined systolic and diastolic heart failure sleep-related hypoxia and a biventricular implantable cardioverter defibrillator in situ.  He was referred by Dr. Jamal Collin his cardiologist and initially on 06-18-19 and then underwent a sleep study on 6-20 921.  His sleep architecture was fairly normal he had 94 central apneas 56 mixed apneas and only 11 additional hypopneas or shallow breathing spells this placed him in severe sleep apnea territory but because of the central nature his apneas were clustering in non-REM sleep and not in dream sleep as obstructive apneas 2.  But he slept supine his apnea index was still very high at 62.1/h he did not show for conization or vocalizations he did have severe hypoxemia his time at night spent below 89% oxygen saturation was 75 minutes with the lowest at 81% nadir.  Based on this and the cyclic breathing nature that I associated with Cheyne-Stokes breathing I asked the patient to return and this took place on 08-10-19 peripheral for the muscles when worse first tried on a CPAP which she failed when on the BiPAP which  was initiated under a full facemask starting at 9/5 cmH2O added with ST 10 backup rate but the AHI still was in the 2 digits at the highest 55.4.   The technologist changed to adapt SV as instructed and he reached a apnea index of 0/h for sleep.  Of 92 minutes with a setting of 16 over 10/4.  This was a prescription that was finally issued with the use of a F 30 I mask which spares the bridge of the nose 9 he has a very narrow bridge) .  ASV: His ASV use has been 100% compliant with an average of 7 hours 18 minutes the expiratory pressure is still set at 10 cmH2O the minimum pressure of 4 at the maximum pressure support is 15 and his residual AHI was 2.1 over the last 30 days on average he has 1 apnea every 5 hours of sleep.  I consider this a very great success.  He does have some air leakage which happens whenever a patient uses nasal pillows or and under the nose facial mask however this is probably still the most comfortable setting for sleep.   He endorsed the Epworth sleepiness score today at 2 out of 24 points and the fatigue severity at 9 out of 63 possible points.      He was seen here in consultation on 06-18-2019 from Einar Gip, MD.   Chief concern according  to patient :   Mr. Behrens is seen on 18 June 2019 as a new patient to the sleep clinic.  He received a implanted defibrillator in the year 2020, has followed for electrophysiology with Dr. Adam Phenix, his general cardiologist is Dr. Ardeth Perfect at this point.  Dr. Einar Gip is concerned that there may be sleep apnea or sleep hypoxemia conditions present that would explain the patient's condition.  He is waking up frequently and reports fragmented sleep.  He is currently on tamsulosin, carvedilol, magnesium and losartan.  Has never smoked, does not drink alcohol has no recreational drug use or caffeine use.    He  has a past medical history of Cardiomegaly, Chronic combined systolic and diastolic heart failure ([place]) (02/10/2018), DCM (dilated  cardiomyopathy) ([place]), Encounter for adjustment of biventricular implantable cardioverter-defibrillator (ICD) (04/03/2019), Fatigue, ICD- Biventricular Medtronic ICD Claria Mri [id number] in situ 02/28/18 (03/01/2018), LBBB (left bundle branch block), Mild tricuspid regurgitation, Moderate aortic regurgitation, Moderate mitral regurgitation, Pericardial effusion, S/P biventricular cardiac pacemaker procedure 02/28/18 with ICD MDT  (03/01/2018), Short of breath on exertion, Systolic heart failure ([place]), and Trace pulmonic regurgitation by prior echocardiogram.    Sleep relevant medical history: Nocturia 4-7 times , every hour. No snoring, no ENT surgery.    Family medical /sleep history:no other family member on CPAP with OSA, insomnia, sleep walkers.    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  He lives in a household with alone-  He is divorced . His daughter , son in-law and children left his home 28 month ago.   Pets are not present. No Tobacco use, ETOH use , no Caffeine intake. Regular exercise in form of walking  4 miles a day. Daily calisthenics.   Hobbies :reading   Sleep habits are as follows: The patient's dinner time is between 2-3 PM. The patient goes to bed at 8.30-9.30 PM and is promptly asleep- he continues to sleep for 1-2 hours, wakes for many, many bathroom breaks.   The preferred sleep position is supine, with the support of 1 pillow.  Dreams are reportedly less frequent  5.30  AM is the usual rise time.The patient wakes up spontaneously.  He reports  feeling refreshed - restored in AM,no  Naps are taken.  Cardiac MRI 11/20/2017: 1. Severely dilated left ventricle with normal wall thickness and severely decreased systolic function (LVEF = 14%). There is severe diffuse hypokinesis and paradoxical septal motion consistent with LBBB. The ratio of non-compacted to compacted myocardium in the apical and mid portions of the ventricle is > 4:1. There is midwall late  gadolinium enhancement in the basal anteroseptal and inferoseptal myocardium. 2. Normal right ventricular size, thickness and systolic function (LVEF = 55%). There are no regional wall motion abnormalities. 3. Severely dilated left atrium (59 mm), normal right atrial size. 4. Moderately dilated pulmonary artery measuring 36 mm. 5. Moderate aortic and mitral regurgitation, mild tricuspid regurgitation. 6. Mild pericardial effusion. Collectively, these findings are consistent with end stage non-compaction cardiomyopathy.  Nocturnal oximetry 05/20/2019: SpO2 <88%: 53 minutes SpO2 <89%: 77 minutes Highest SPO2 99% and lowest SPO2 76%.  Oxygen desaturation events 225, desaturation index 25. Impression: Patient qualifies for nocturnal oxygen supplementation per Medicare guidelines Group 1.  Echocardiogram 10/09/2019: Severely depressed LV systolic function with visual EF 25-30%. Left ventricle cavity is severely dilated. Severe global hypokinesis. Doppler evidence of grade II diastolic dysfunction, indeterminate LAP.  Left atrial cavity is severely dilated 70.49ml/m2. Endocardial wire noted within  the right cardiac chambers.  Mild to moderate aortic regurgitation. Moderate to severe mitral regurgitation. Moderate tricuspid regurgitation. Mild pulmonary hypertension. RVSP measures 48 mmHg. Mild pulmonic regurgitation. Moderate pericardial effusion. There is no hemodynamic significance. IVC is dilated with a respiratory response of >50%. Compared prior study 03/05/2019: LVEF has improved from <20% to 25-30%, Grade 3DD is now Grade 2 diastolic dysfunction, pulmonary hypertension per RVSP has improved, otherwise no significant change.    EKG:  03/18/1019: Probably AV paced rhythm with first-degree AV block, biventricular pacemaker detected.  No further analysis. Single PVC.  Review of Systems: Out of a complete 14 system review, the patient complains of only the following symptoms,  and all other reviewed systems are negative.:  Fatigue, sleepiness , snoring,   Nocturia- fragmented sleep,no  Insomnia , no RLS.    How likely are you to doze in the following situations: 0 = not likely, 1 = slight chance, 2 = moderate chance, 3 = high chance   Sitting and Reading? Watching Television? Sitting inactive in a public place (theater or meeting)? As a passenger in a car for an hour without a break? Lying down in the afternoon when circumstances permit? Sitting and talking to someone? Sitting quietly after lunch without alcohol? In a car, while stopped for a few minutes in traffic?   Total = 2/ 24 points   FSS endorsed at 9 from 18/ 63 points.   Social History   Socioeconomic History  . Marital status: Divorced    Spouse name: Not on file  . Number of children: 3  . Years of education: Not on file  . Highest education level: Not on file  Occupational History  . Not on file  Tobacco Use  . Smoking status: Never Smoker  . Smokeless tobacco: Never Used  Vaping Use  . Vaping Use: Never used  Substance and Sexual Activity  . Alcohol use: No  . Drug use: No  . Sexual activity: Not on file  Other Topics Concern  . Not on file  Social History Narrative  . Not on file   Social Determinants of Health   Financial Resource Strain:   . Difficulty of Paying Living Expenses: Not on file  Food Insecurity:   . Worried About Charity fundraiser in the Last Year: Not on file  . Ran Out of Food in the Last Year: Not on file  Transportation Needs:   . Lack of Transportation (Medical): Not on file  . Lack of Transportation (Non-Medical): Not on file  Physical Activity:   . Days of Exercise per Week: Not on file  . Minutes of Exercise per Session: Not on file  Stress:   . Feeling of Stress : Not on file  Social Connections:   . Frequency of Communication with Friends and Family: Not on file  . Frequency of Social Gatherings with Friends and Family: Not on file  .  Attends Religious Services: Not on file  . Active Member of Clubs or Organizations: Not on file  . Attends Archivist Meetings: Not on file  . Marital Status: Not on file    Family History  Problem Relation Age of Onset  . Hypertension Mother 39  . Thyroid disease Mother   . Healthy Father 29    Past Medical History:  Diagnosis Date  . Cardiomegaly   . Chronic combined systolic and diastolic heart failure ([place]) 02/10/2018  . DCM (dilated cardiomyopathy) ([place])   . Encounter for adjustment of  biventricular implantable cardioverter-defibrillator (ICD) 04/03/2019  . Fatigue   . ICD- Biventricular Medtronic ICD Claria Mri [id number] in situ 02/28/18 03/01/2018  . LBBB (left bundle branch block)   . Mild tricuspid regurgitation   . Moderate aortic regurgitation   . Moderate mitral regurgitation   . Pericardial effusion    INSIGNIFICANT  . S/P biventricular cardiac pacemaker procedure 02/28/18 with ICD MDT  03/01/2018  . Short of breath on exertion   . Systolic heart failure ([place])   . Trace pulmonic regurgitation by prior echocardiogram     Past Surgical History:  Procedure Laterality Date  . BIV ICD INSERTION CRT-D N/A 02/28/2018   Procedure: BIV ICD INSERTION CRT-D;  Surgeon: Deboraha Sprang, MD;  Location: [facility];  Service: Cardiovascular;  Laterality: N/A;  . LEG SURGERY Left 2000   DUE TO BROKE LEG     Current Outpatient Medications on File Prior to Visit  Medication Sig Dispense Refill  . carvedilol (COREG) 3.125 MG tablet TAKE 1 TABLET BY MOUTH TWICE DAILY WITH MEALS 180 tablet 1  . dapagliflozin propanediol (FARXIGA) 10 MG TABS tablet Take 1 tablet (10 mg total) by mouth daily before breakfast. 30 tablet 3  . finasteride (PROSCAR) 5 MG tablet Take 5 mg by mouth daily.    Marland Kitchen losartan (COZAAR) 25 MG tablet Take 1 tablet (25 mg total) by mouth daily. 90 tablet 3  . magnesium oxide (MAG-OX) 400 MG tablet Take 400 mg by mouth 2 (two) times daily.    . tamsulosin  (FLOMAX) 0.4 MG CAPS capsule Take 0.4 mg by mouth daily.     Marland Kitchen torsemide (DEMADEX) 20 MG tablet Take 1 tablet (20 mg total) by mouth daily. 30 tablet 2   No current facility-administered medications on file prior to visit.    Allergies  Allergen Reactions  . Aspirin Other (See Comments)    Stomach pain     Physical exam:  Today's Vitals   12/09/19 1036  BP: 103/67  Pulse: 70  Weight: 170 lb (77.1 kg)  Height: 6' (1.829 m)   Body mass index is 23.06 kg/m.   Wt Readings from Last 3 Encounters:  12/09/19 170 lb (77.1 kg)  10/16/19 167 lb (75.8 kg)  10/09/19 166 lb 12.8 oz (75.7 kg)     Ht Readings from Last 3 Encounters:  12/09/19 6' (1.829 m)  10/16/19 6' (1.829 m)  10/09/19 6' (1.829 m)      General: The patient is awake, alert and appears not in acute distress.  The patient is well groomed. Head: Normocephalic, atraumatic. Neck is supple. Mallampati 2. neck circumference:14. 25  Inches.  Nasal airflow patent.  Retrognathia is not  seen.  Dental status:  Cardiovascular:  Regular rate and cardiac rhythm by pulse,  without distended neck veins. Respiratory: Lungs are clear to auscultation.  Skin:  Without evidence of ankle edema, or rash. Trunk: The patient's posture is erect.   Neurologic exam : The patient is awake and alert, oriented to place and time.   Memory subjective described as intact.  Attention span & concentration ability appears normal.  Speech is fluent,  without  dysarthria, dysphonia or aphasia.  Mood and affect are appropriate.   Cranial nerves: no loss of smell or taste reported  Pupils are equal and briskly reactive to light. Funduscopic exam deferred.  Extraocular movements in vertical and horizontal planes were intact and without nystagmus. No Diplopia. Visual fields by finger perimetry are intact. Hearing was intact to  soft voice and finger rubbing. Facial sensation intact to fine touch. Facial motor strength is symmetric and tongue and  uvula move midline.  Neck ROM : rotation, tilt and flexion extension were normal for age and shoulder shrug was symmetrical.    Motor exam:  Symmetric bulk, tone and ROM.  Normal tone . Sensory:  Fine touch, pinprick and vibration were normal.   Coordination: Rapid alternating movements in the fingers/hands were of normal speed.  The Finger-to-nose maneuver was intact without evidence of ataxia, dysmetria or tremor.  Gait and station: Patient could rise unassisted from a seated position, walked without assistive device.  Toe and heel walk were deferred.  Deep tendon reflexes: in the  upper and lower extremities are symmetric and intact.  Babinski response was deferred .    Mr. Anger presents today with a nonischemic dilated cardiomyopathy and severely depressed left ventricular systolic function.  Ejection fraction has been in the teens for the last 3 years.  A cardiac MRI shows a severely dilated left ventricle with normal wall thickness and an LVEF of 14%.  This combined systolic and diastolic heart failure has been treated with the implanted cardioverter defibrillator and ICD biventricular Medtronic.  The patient also has multi valvular regurgitation, he reports that he feels rather well but he does have a lot of nocturia.  In short, Sahib C Schonberger is presenting with dilated cardiomyopathy and 2 ONOs indicating severe hypoxemia.  Nocturnal oximetry was performed on 19 May it showed 53 minutes of oxygen desaturation under 88% and 77 minutes of oxygen desaturation under 89% the lowest nadir SPO2 was 76%.       After spending a total time of 25 minutes face to face and additional time for physical and neurologic examination, review of laboratory studies,  personal review of imaging studies, reports and results of other testing and review of referral information / records as far as provided in visit, I have established the following Plan.  1)   Severe  central apnea with hypoxemia   resonded to ASV at current settings, after failing  CPAp, BiPAP and BiPAP ST.   Please note - the patient is no longer using oxygen at home.   I would like to thank Dr Einar Gip, MD, again for allowing me to meet with and to take care of this pleasant patient. He will have a new echocardiogram in a couple of months I understand - I am looking forward to follow up in 12 months.    Electronically signed by: Larey Seat, MD 12/09/2019 11:03 AM  [facility] Board certified by The [employer] of Sleep Medicine and Diplomate of the [employer] of Sleep Medicine. Board certified In Neurology through the [place], Fellow of the [employer] of Neurology. Medical Director of [facility].

## 2020-01-15 ENCOUNTER — Ambulatory Visit: Payer: Medicare Other | Admitting: Cardiology

## 2020-01-15 ENCOUNTER — Telehealth: Payer: Self-pay | Admitting: Cardiology

## 2020-01-15 ENCOUNTER — Encounter: Payer: Self-pay | Admitting: Cardiology

## 2020-01-15 ENCOUNTER — Other Ambulatory Visit: Payer: Self-pay

## 2020-01-15 VITALS — BP 87/56 | HR 66 | Temp 98.1°F | Resp 16 | Ht 72.0 in | Wt 176.4 lb

## 2020-01-15 DIAGNOSIS — I5022 Chronic systolic (congestive) heart failure: Secondary | ICD-10-CM

## 2020-01-15 DIAGNOSIS — Z95810 Presence of automatic (implantable) cardiac defibrillator: Secondary | ICD-10-CM

## 2020-01-15 DIAGNOSIS — I428 Other cardiomyopathies: Secondary | ICD-10-CM

## 2020-01-15 DIAGNOSIS — N1831 Chronic kidney disease, stage 3a: Secondary | ICD-10-CM

## 2020-01-15 MED ORDER — LOSARTAN POTASSIUM 25 MG PO TABS: 25.0000 mg | ORAL_TABLET | Freq: Every evening | ORAL | 3 refills | Status: DC

## 2020-01-15 NOTE — Progress Notes (Signed)
Primary Physician:  [name], MD   Patient ID: Miguel Patterson, male    DOB: 1945/02/03, 75 y.o.   MRN: [id number]   Chief Complaint  Patient presents with  . Cardiomyopathy  . Congestive Heart Failure  . Follow-up    3 months    HPI    Miguel Patterson  is a 75 y.o. male  from [place]. He has severe non ischemic dilated cardiomyopathy with severely depressed left-ventricular systolic function, Ejection fraction has been in teens for past 3 years in spite of optimal medical therapy. 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 Underwent BIV ICD CRT insertion on 02/28/2018 with Dr. Caryl Comes for nonischemic cardiomyopathy.   He was evaluated by Dr. Adam Phenix on 06/02/2019, he did change his BiV ICD settings from adaptive CRT to turning on biventricular pacing.  He was also evaluated by Dr. Haroldine Laws on 10/09/2019 who recommended  starting Farxiga for heart failure along with close monitoring of LV systolic function and repeating an echocardiogram.  He also recommended genetic testing to exclude familial noncompaction. He was evaluated by Dr. Nira Conn, PhD for genetic counseling who felt it was probably a nonfamilial noncompaction cardiomyopathy and in view of his advanced age and no other family members having early cardiac death, did not recommend any further evaluation.  He presents for 56-monthfollow-up, states that he is doing well since losartan was started and EDelene Lollwas discontinued he has not had significant dizziness or near syncopal spell.  He has not had any leg edema, PND or orthopnea.  He still feels he has mild dyspnea with exertion but able to get along without any problems for doing activities of daily living.  Past Medical History:  Diagnosis Date  . Cardiomegaly   . Chronic combined systolic and diastolic heart failure ([place]  02/10/2018  . DCM (dilated cardiomyopathy) ([place]   . Encounter for adjustment of biventricular implantable cardioverter-defibrillator (ICD) 04/03/2019  . Fatigue   . ICD- Biventricular Medtronic ICD Claria Mri [id number] in situ 02/28/18 03/01/2018  . LBBB (left bundle branch block)   . Mild tricuspid regurgitation   . Moderate aortic regurgitation   . Moderate mitral regurgitation   . Pericardial effusion    INSIGNIFICANT  . S/P biventricular cardiac pacemaker procedure 02/28/18 with ICD MDT  03/01/2018  . Short of breath on exertion   . Systolic heart failure ([place]   . Trace pulmonic regurgitation by prior echocardiogram     Past Surgical History:  Procedure Laterality Date  . BIV ICD INSERTION CRT-D N/A 02/28/2018   Procedure: BIV ICD INSERTION CRT-D;  Surgeon: KDeboraha Sprang MD;  [facility];  Service: Cardiovascular;  Laterality: N/A;  . LEG SURGERY Left 2000   DUE TO BROKE LEG   Family History  Problem Relation Age of Onset  . Hypertension Mother 864 . Thyroid disease Mother   . Healthy Father 965   Social History   Tobacco Use  . Smoking status: Never Smoker  . Smokeless tobacco: Never Used  Substance Use Topics  . Alcohol use: No    Marital Status: Divorced  ROS:   Review of Systems  Constitutional: Negative for malaise/fatigue.  Cardiovascular: Positive for dyspnea on exertion (improved). Negative for chest pain and leg swelling.  Genitourinary: Negative for frequency and hesitancy.   Objective:  Blood pressure (!) 87/56, pulse 66,  temperature 98.1 F (36.7 C), resp. rate 16, height 6' (1.829 m), weight 176 lb 6.4 oz (80 kg), SpO2 94 %. Body mass index is 23.92 kg/m.  Vitals with BMI 01/15/2020 12/09/2019 10/16/2019  Height 6' 0"  6' 0"  6' 0"   Weight 176 lbs 6 oz 170 lbs 167 lbs  BMI 23.92 16.10 96.04  Systolic 87 540 981  Diastolic 56 67 56  Pulse 66 70 73     Physical Exam Vitals reviewed.  Constitutional:      Appearance: He is  well-developed.  Cardiovascular:     Rate and Rhythm: Normal rate and regular rhythm.     Pulses: Normal pulses and intact distal pulses.     Heart sounds: Normal heart sounds. No murmur heard. No gallop.      Comments: NO JVD, No leg edema. Pulmonary:     Effort: Pulmonary effort is normal. No accessory muscle usage or respiratory distress.     Breath sounds: Normal breath sounds.  Abdominal:     General: Bowel sounds are normal.     Palpations: Abdomen is soft.    Laboratory examination:   CMP Latest Ref Rng & Units 07/13/2019 06/22/2019 05/18/2019  Glucose 65 - 99 mg/dL 104(H) 105(H) 86  BUN 8 - 27 mg/dL 42(H) 56(H) 48(H)  Creatinine 0.76 - 1.27 mg/dL 1.56(H) 1.94(H) 1.72(H)  Sodium 134 - 144 mmol/L 146(H) 142 141  Potassium 3.5 - 5.2 mmol/L 4.3 4.9 4.1  Chloride 96 - 106 mmol/L 105 100 100  CO2 20 - 29 mmol/L 26 27 25   Calcium 8.6 - 10.2 mg/dL 9.6 9.5 9.2  Total Protein 6.0 - 8.5 g/dL - - -  Total Bilirubin 0.0 - 1.2 mg/dL - - -  Alkaline Phos 39 - 117 IU/L - - -  AST 0 - 40 IU/L - - -  ALT 0 - 44 IU/L - - -   CBC Latest Ref Rng & Units 02/25/2018  WBC 3.4 - 10.8 x10E3/uL 5.2  Hemoglobin 13.0 - 17.7 g/dL 14.1  Hematocrit 37.5 - 51.0 % 43.3  Platelets 150 - 450 x10E3/uL 137(L)   Lipid Panel     Component Value Date/Time   CHOL 179 03/20/2019 0804   TRIG 61 03/20/2019 0804   HDL 51 03/20/2019 0804   LDLCALC 116 (H) 03/20/2019 0804   HEMOGLOBIN A1C No results found for: HGBA1C, MPG TSH Recent Labs    03/20/19 0804 04/24/19 0932  TSH 4.720* 3.360    Current Outpatient Medications on File Prior to Visit  Medication Sig Dispense Refill  . carvedilol (COREG) 3.125 MG tablet TAKE 1 TABLET BY MOUTH TWICE DAILY WITH MEALS 180 tablet 1  . dapagliflozin propanediol (FARXIGA) 10 MG TABS tablet Take 1 tablet (10 mg total) by mouth daily before breakfast. 30 tablet 3  . finasteride (PROSCAR) 5 MG tablet Take 5 mg by mouth daily.    . magnesium oxide (MAG-OX) 400 MG tablet  Take 400 mg by mouth 2 (two) times daily.    . tamsulosin (FLOMAX) 0.4 MG CAPS capsule Take 0.4 mg by mouth daily.     Marland Kitchen torsemide (DEMADEX) 20 MG tablet Take 1 tablet (20 mg total) by mouth daily. 30 tablet 2   No current facility-administered medications on file prior to visit.    BNP (last 3 results) Recent Labs    05/18/19 0943 06/22/19 0916 07/13/19 1031  BNP 1,026.4* 631.8* 611.9*   Labs not auto populated  into epic 06/22/2019:  Serum glucose 105, BUN 56,  creatinine 1.94, EGFR 33 mL.  Sodium 142, potassium 4.9.  BNP 631.    Radiology:   No results found.  Cardiac Studies:   Exercise sestamibi stress test 08/27/2014: 1. The resting electrocardiogram demonstrated normal sinus rhythm, incomplete LBBB and no resting arrhythmias. ST segment depression and T-wave inversion in inferior and lateral leads. Stress EKG is negative for ischemia. The patient performed treadmill exercise using a Bruce protocol, completing 6:26 minutes. Thre were occasional PVCs. The patient completed an estimated workload of 7.65 METS, 95% of the maximum predicted heart rate. The stress test was terminated because of dyspnea. 2. The LV is markedly dilated both at rest and stress images. The LV end diastolic volume was 062IR. Thre is anterior wall thinning. There is no evidence of ischemia. Thre is severe generalized hypokinesis and LVEF markedly depressed at 11%. Findings are consistent with dilated cardiomyopathy. Clinical correlation is recommended.  Cardiac MRI 11/20/2017: 1. Severely dilated left ventricle with normal wall thickness and severely decreased systolic function (LVEF = 14%). There is severe diffuse hypokinesis and paradoxical septal motion consistent with LBBB. The ratio of non-compacted to compacted myocardium in the apical and mid portions of the ventricle is > 4:1. There is midwall late gadolinium enhancement in the basal anteroseptal and inferoseptal myocardium. 2. Normal right  ventricular size, thickness and systolic function (LVEF = 55%). There are no regional wall motion abnormalities. 3. Severely dilated left atrium (59 mm), normal right atrial size. 4. Moderately dilated pulmonary artery measuring 36 mm. 5. Moderate aortic and mitral regurgitation, mild tricuspid regurgitation. 6. Mild pericardial effusion. Collectively, these findings are consistent with end stage non-compaction cardiomyopathy.  Nocturnal oximetry 05/20/2019: SpO2 <88%:  53 minutes SpO2 <89%:  77 minutes Highest SPO2 99% and lowest SPO2 76%.  Oxygen desaturation events 225, desaturation index 25. Impression: Patient qualifies for nocturnal oxygen supplementation per Medicare guidelines Group 1.  Echocardiogram 10/09/2019: Severely depressed LV systolic function with visual EF 25-30%. Left ventricle cavity is severely dilated. Severe global hypokinesis.  Doppler evidence of grade II diastolic dysfunction, indeterminate LAP.  Left atrial cavity is severely dilated 70.15m/m2. Endocardial wire noted within the right cardiac chambers.  Mild to moderate aortic regurgitation. Moderate to severe mitral regurgitation. Moderate tricuspid regurgitation. Mild pulmonary hypertension. RVSP measures 48 mmHg. Mild pulmonic regurgitation. Moderate pericardial effusion. There is no hemodynamic significance. IVC is dilated with a respiratory response of >50%. Compared prior study 03/05/2019: LVEF has improved from <20% to 25-30%, Grade 3DD is now Grade 2 diastolic dysfunction, pulmonary hypertension per RVSP has improved,  otherwise no significant change.    EKG:  03/18/1019: Probably AV paced rhythm with first-degree AV block, biventricular pacemaker detected.  No further analysis. Single PVC.  Assessment:     ICD-10-CM   1. Left ventricular non-compaction cardiomyopathy (HCC)  I42.8   2. Congestive heart failure, NYHA class 2, chronic, systolic (HCC)  IS85.46losartan (COZAAR) 25 MG tablet  3.  Stage 3a chronic kidney disease (HCC)  N18.31   4. ICD- Biventricular Medtronic ICD Claria Mri [id number] in situ 02/28/18  Z95.810     Meds ordered this encounter  Medications  . losartan (COZAAR) 25 MG tablet    Sig: Take 1 tablet (25 mg total) by mouth every evening.    Dispense:  90 tablet    Refill:  3   Medications Discontinued During This Encounter  Medication Reason  . losartan (COZAAR) 25 MG tablet      Recommendations:   Miguel Patterson  is a  75 y.o. from [place]. He has severe non ischemic dilated cardiomyopathy with severely depressed left-ventricular systolic function, Ejection fraction has been in teens for past 3 years in spite of optimal medical therapy. 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 Underwent BIV ICD CRT insertion on 02/28/2018 with Dr. Caryl Comes for nonischemic cardiomyopathy.   He was evaluated by Dr. Adam Phenix on 06/02/2019, he did change his BiV ICD settings from adaptive CRT to turning on biventricular pacing.  He was also evaluated by Dr. Haroldine Laws on 10/09/2019 who recommended  starting Farxiga for heart failure along with close monitoring of LV systolic function and repeating an echocardiogram.  He also recommended genetic testing to exclude familial noncompaction. He was evaluated by Dr. Nira Conn, PhD for genetic counseling who felt it was probably a nonfamilial noncompaction cardiomyopathy and in view of his advanced age and no other family members having early cardiac death, did not recommend any further evaluation.  With guideline directed medical therapy, his EF has improved from 10 to 15% to the present 20 to 25%.  No clinical evidence of heart failure and acute decompensation.  He is on appropriate guideline directed medical therapy.  Unable to uptitrate any of his medications in view of low blood pressure.  His blood  pressure was low today but asymptomatic.  Advised him to switch from taking losartan in the morning to taking it in the evening prior to going to bed and to separate that from carvedilol which he can take after supper around 5 PM.  I will repeat echocardiogram in 3 months and see him back at that time.  He has BiV ICD, patient request that I take over the monitoring of his ICD.  I will send a request to [facility].   Adrian Prows, MD, [facility] 01/15/2020, 3:49 PM Office: [phone]

## 2020-02-10 ENCOUNTER — Encounter (HOSPITAL_COMMUNITY): Payer: Self-pay | Admitting: Internal Medicine

## 2020-02-10 ENCOUNTER — Other Ambulatory Visit: Payer: Self-pay

## 2020-02-10 ENCOUNTER — Ambulatory Visit (HOSPITAL_COMMUNITY)
Admission: RE | Admit: 2020-02-10 | Discharge: 2020-02-10 | Disposition: A | Discharge status: Home / Self Care | Payer: Medicare Other | Source: Ambulatory Visit | Attending: Internal Medicine | Admitting: Internal Medicine

## 2020-02-10 VITALS — BP 100/70 | HR 70 | Wt 180.2 lb

## 2020-02-10 DIAGNOSIS — N1832 Chronic kidney disease, stage 3b: Secondary | ICD-10-CM | POA: Insufficient documentation

## 2020-02-10 DIAGNOSIS — N1831 Chronic kidney disease, stage 3a: Secondary | ICD-10-CM | POA: Diagnosis not present

## 2020-02-10 DIAGNOSIS — I5022 Chronic systolic (congestive) heart failure: Secondary | ICD-10-CM | POA: Insufficient documentation

## 2020-02-10 DIAGNOSIS — Z950 Presence of cardiac pacemaker: Secondary | ICD-10-CM | POA: Insufficient documentation

## 2020-02-10 DIAGNOSIS — I493 Ventricular premature depolarization: Secondary | ICD-10-CM | POA: Insufficient documentation

## 2020-02-10 DIAGNOSIS — Z886 Allergy status to analgesic agent status: Secondary | ICD-10-CM | POA: Insufficient documentation

## 2020-02-10 DIAGNOSIS — I38 Endocarditis, valve unspecified: Secondary | ICD-10-CM | POA: Diagnosis not present

## 2020-02-10 DIAGNOSIS — Z79899 Other long term (current) drug therapy: Secondary | ICD-10-CM | POA: Diagnosis not present

## 2020-02-10 DIAGNOSIS — Z7984 Long term (current) use of oral hypoglycemic drugs: Secondary | ICD-10-CM | POA: Diagnosis not present

## 2020-02-10 DIAGNOSIS — I428 Other cardiomyopathies: Secondary | ICD-10-CM

## 2020-02-10 DIAGNOSIS — Z8249 Family history of ischemic heart disease and other diseases of the circulatory system: Secondary | ICD-10-CM | POA: Insufficient documentation

## 2020-02-10 LAB — CBC
HCT: 40.7 % (ref 39.0–52.0)
Hemoglobin: 12.9 g/dL — ABNORMAL LOW (ref 13.0–17.0)
MCH: 30.2 pg (ref 26.0–34.0)
MCHC: 31.7 g/dL (ref 30.0–36.0)
MCV: 95.3 fL (ref 80.0–100.0)
Platelets: 135 10*3/uL — ABNORMAL LOW (ref 150–400)
RBC: 4.27 MIL/uL (ref 4.22–5.81)
RDW: 13.7 % (ref 11.5–15.5)
WBC: 4.5 10*3/uL (ref 4.0–10.5)
nRBC: 0 % (ref 0.0–0.2)

## 2020-02-10 LAB — COMPREHENSIVE METABOLIC PANEL
ALT: 16 U/L (ref 0–44)
AST: 19 U/L (ref 15–41)
Albumin: 3.8 g/dL (ref 3.5–5.0)
Alkaline Phosphatase: 64 U/L (ref 38–126)
Anion gap: 10 (ref 5–15)
BUN: 38 mg/dL — ABNORMAL HIGH (ref 8–23)
CO2: 27 mmol/L (ref 22–32)
Calcium: 9 mg/dL (ref 8.9–10.3)
Chloride: 103 mmol/L (ref 98–111)
Creatinine, Ser: 1.51 mg/dL — ABNORMAL HIGH (ref 0.61–1.24)
GFR, Estimated: 48 mL/min — ABNORMAL LOW (ref >60)
Glucose, Bld: 94 mg/dL (ref 70–99)
Potassium: 4.9 mmol/L (ref 3.5–5.1)
Sodium: 140 mmol/L (ref 135–145)
Total Bilirubin: 1 mg/dL (ref 0.3–1.2)
Total Protein: 6.5 g/dL (ref 6.5–8.1)

## 2020-02-10 LAB — BRAIN NATRIURETIC PEPTIDE: B Natriuretic Peptide: 816.6 pg/mL — ABNORMAL HIGH (ref 0.0–100.0)

## 2020-02-10 NOTE — Addendum Note (Signed)
Encounter addended by: Stanford Scotland, RN on: 02/10/2020 10:57 AM  Actions taken: Visit diagnoses modified, Order list changed, Diagnosis association updated, Clinical Note Signed, Charge Capture section accepted

## 2020-02-10 NOTE — Patient Instructions (Signed)
Labs done today, your results will be available in MyChart, we will contact you for abnormal readings.  Please call the office in July 2022 to schedule an appointment   If you have any questions or concerns before your next appointment please send [place] a message through [place] or call our office at [phone].    TO LEAVE A MESSAGE FOR THE NURSE SELECT OPTION 2, PLEASE LEAVE A MESSAGE INCLUDING: . YOUR NAME . DATE OF BIRTH . CALL BACK NUMBER . REASON FOR CALL**this is important as we prioritize the call backs  [place] AS LONG AS YOU CALL BEFORE 4:00 PM At the [facility], you and your health needs are our priority. As part of our continuing mission to provide you with exceptional heart care, we have created designated Provider Care Teams. These Care Teams include your primary Cardiologist (physician) and Advanced Practice Providers (APPs- Physician Assistants and Nurse Practitioners) who all work together to provide you with the care you need, when you need it.   You may see any of the following providers on your designated Care Team at your next follow up: Marland Kitchen Dr Glori Bickers . Dr Loralie Champagne . Darrick Grinder, NP . Lyda Jester, [place] . Audry Riles, PharmD   Please be sure to bring in all your medications bottles to every appointment.

## 2020-02-10 NOTE — Progress Notes (Signed)
ADVANCED HF CLINIC CONSULT NOTE  Referring Physician: Dr. Einar Patterson  Primary Care: Miguel Sacramento, MD Primary Cardiologist: Miguel Patterson EP: Miguel Patterson  HPI:  Miguel Patterson  is a 75 y.o. male from [place] with chronic systolic HF and [id number] (Creatinine 1.5-1.9) referred by Dr. Einar Patterson to discuss candidacy for advanced HF therapies.   Miguel Patterson has a fairly limited PMHx. No previous h/o HTN or known CAD.   He first developed HF symptoms in 2016 and saw his PCP who referred him to Miguel Patterson and then Dr. Einar Patterson.   He was referred to Dr. Caryl Patterson in 2019 for ICD consideration. According to Miguel Patterson note from 10/14/17 initial echo in 2016 had EF 11% with anterior thinning. There were also frequent PVCs (~25% by ECG). At that time he did not have LBBB (IVCD). The question was raised about the possibility of Chagas disease or a genetic component. (he is 1 of 17 children and several family members are on heart medications). He was referred for a cMRI.   Echo in 8/19 read as EF < 15% mod AI/MR (I do not have images to review).   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 Underwent BIV ICD CRT insertion on 02/28/2018 with Dr. Caryl Patterson for nonischemic cardiomyopathy. In 06/02/2019, he did change his BiV ICD settings from adaptive CRT to turning on biventricular pacing. He does not recall ever having a cardiac cath.  We saw him in 11/21 and referred for genetic evaluation for LVNC. He was evaluated by Dr. 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  Overall he has done very well. He saw Dr. Einar Patterson recently and losartan moved to bedtime due to low BP.  Repeat echo 10/21 EF 25-30%  Feels good  but no longer walking 7 miles per day due to the cold weather. Says he is not doing much at all. No CP, SOB, orthopnea or PND. No dizziness.   ICD interrogation: No VT/AF. 100% Biv pacing. Fluid ok. Activity level 4hr/day Personally reviewed   CPX 11/21  FVC 3.17 (71%)    FEV1 2.33 (70%)     FEV1/FVC 74 (97%)     MVV 94 (72%)      BP rest: 86/58 Standing BP: 92/60 BP peak: 122/60   Peak VO2: 26.9 (103% predicted peak VO2)  VE/VCO2 slope: 35  OUES: 2.17  Peak RER: 1.11   Ventilatory Threshold: 15.4 (59% predicted or measured peak VO2)   VE/MVV: 93%  O2pulse: 14  (100% predicted O2pulse)    Past Medical History:  Diagnosis Date  . Cardiomegaly   . Chronic combined systolic and diastolic heart failure ([place]) 02/10/2018  . DCM (dilated cardiomyopathy) ([place])   . Encounter for adjustment of biventricular implantable cardioverter-defibrillator (ICD) 04/03/2019  . Fatigue   . ICD- Biventricular Medtronic ICD Claria Mri Dtma1qq in situ 02/28/18 03/01/2018  . LBBB (left bundle branch block)   . Mild tricuspid regurgitation   . Moderate aortic regurgitation   . Moderate mitral regurgitation   . Pericardial effusion    INSIGNIFICANT  . S/P biventricular cardiac pacemaker procedure 02/28/18 with ICD MDT  03/01/2018  . Short of breath on exertion   . Systolic heart failure ([place])   .  Trace pulmonic regurgitation by prior echocardiogram     Past Medical History:  Diagnosis Date  . Cardiomegaly   . Chronic combined systolic and diastolic heart failure ([place]) 02/10/2018  . DCM (dilated cardiomyopathy) ([place])   . Encounter for adjustment of biventricular implantable cardioverter-defibrillator (ICD) 04/03/2019  . Fatigue   . ICD- Biventricular Medtronic ICD Claria Mri [id number] in situ 02/28/18 03/01/2018  . LBBB (left bundle branch block)   . Mild tricuspid regurgitation   . Moderate aortic regurgitation   . Moderate mitral regurgitation   . Pericardial effusion     INSIGNIFICANT  . S/P biventricular cardiac pacemaker procedure 02/28/18 with ICD MDT  03/01/2018  . Short of breath on exertion   . Systolic heart failure ([place])   . Trace pulmonic regurgitation by prior echocardiogram     Current Outpatient Medications  Medication Sig Dispense Refill  . carvedilol (COREG) 3.125 MG tablet TAKE 1 TABLET BY MOUTH TWICE DAILY WITH MEALS 180 tablet 1  . dapagliflozin propanediol (FARXIGA) 10 MG TABS tablet Take 1 tablet (10 mg total) by mouth daily before breakfast. 30 tablet 3  . finasteride (PROSCAR) 5 MG tablet Take 5 mg by mouth daily.    Marland Kitchen losartan (COZAAR) 25 MG tablet Take 1 tablet (25 mg total) by mouth every evening. 90 tablet 3  . magnesium oxide (MAG-OX) 400 MG tablet Take 400 mg by mouth 2 (two) times daily.    . tamsulosin (FLOMAX) 0.4 MG CAPS capsule Take 0.4 mg by mouth daily.     Marland Kitchen torsemide (DEMADEX) 20 MG tablet Take 1 tablet (20 mg total) by mouth daily. 30 tablet 2   No current facility-administered medications for this encounter.    Allergies  Allergen Reactions  . Aspirin Other (See Comments)    Stomach pain       Social History   Socioeconomic History  . Marital status: Divorced    Spouse name: Not on file  . Number of children: 3  . Years of education: Not on file  . Highest education level: Not on file  Occupational History  . Not on file  Tobacco Use  . Smoking status: Never Smoker  . Smokeless tobacco: Never Used  Vaping Use  . Vaping Use: Never used  Substance and Sexual Activity  . Alcohol use: No  . Drug use: No  . Sexual activity: Not on file  Other Topics Concern  . Not on file  Social History Narrative  . Not on file   Social Determinants of Health   Financial Resource Strain: Not on file  Food Insecurity: Not on file  Transportation Needs: Not on file  Physical Activity: Not on file  Stress: Not on file  Social Connections: Not on file  Intimate Partner Violence: Not on file      Family  History  Problem Relation Age of Onset  . Hypertension Mother 28  . Thyroid disease Mother   . Healthy Father 72    Vitals:   02/10/20 1010  BP: 100/70  Pulse: 70  SpO2: 99%  Weight: 81.7 kg (180 lb 3.2 oz)    PHYSICAL EXAM: General:  Well appearing. No resp difficulty HEENT: normal Neck: supple. no JVD. Carotids 2+ bilat; no bruits. No lymphadenopathy or thryomegaly appreciated. Cor: PMI nondisplaced. Regular rate & rhythm. No rubs, gallops or murmurs. Lungs: clear Abdomen: soft, nontender, nondistended. No hepatosplenomegaly. No bruits or masses. Good bowel sounds. Extremities: no cyanosis, clubbing, rash, edema Neuro: alert & orientedx3, cranial nerves grossly  intact. moves all 4 extremities w/o difficulty. Affect pleasant   ASSESSMENT & PLAN:  1. 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  - etiology remains unclear. 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 He also has a h/o LBBB but his CM predated the development of LBBB so doubt it can be implicated. Previously also has had frequent PVCs but not present today - Echo 10/21 EF 25-30% with Dr. Einar Patterson  - s/p MDT CRT-D - currently NYHA I with excellent exercise capacity despite severe CM  - CPX 11/21 looks good. pVO2 26.9 - volume status looks good on torsemide 20 daily - continue losartan '25mg'$  qhs (unable to tolerate Entresto due to hypotension) - continue carvedilol 3.125 bid - Continue Farxiga 10 (decrease torsemide to 20 daily) - Consider spiro in future as BP tolerates - We discussed the role of advanced therapies (translant and VAD). Given age he does not qualify for transplant (cutoff 75 y/o) and functional capacity also too good for VAD or transplant consideration - Genetic eval  complete as above  2. Valvular heart disease - Mod AI/Mod MR - likely functional.  - stable  3. [id number] - Continue Wilder Glade - check labs today   Glori Bickers, MD  10:37 AM

## 2020-02-16 ENCOUNTER — Other Ambulatory Visit: Payer: Self-pay | Admitting: Internal Medicine

## 2020-02-16 DIAGNOSIS — I5022 Chronic systolic (congestive) heart failure: Secondary | ICD-10-CM

## 2020-02-17 ENCOUNTER — Telehealth: Payer: Self-pay

## 2020-02-17 NOTE — Telephone Encounter (Signed)
Thank you and I have accepted him. JG

## 2020-02-17 NOTE — Telephone Encounter (Signed)
I have released the patient to your clinic just pick him up in pending patient transfers in [place].

## 2020-02-17 NOTE — Telephone Encounter (Signed)
Patient was released to Dr. Einar Gip office so he can follow him for now on

## 2020-02-20 IMAGING — CR DG CHEST 2V
2 series · 2 of 2 positions shown · non-contrast
Comparison: 07/14/2014.

CLINICAL DATA: Pacemaker.

EXAM:
CHEST - 2 VIEW

[chest lat]
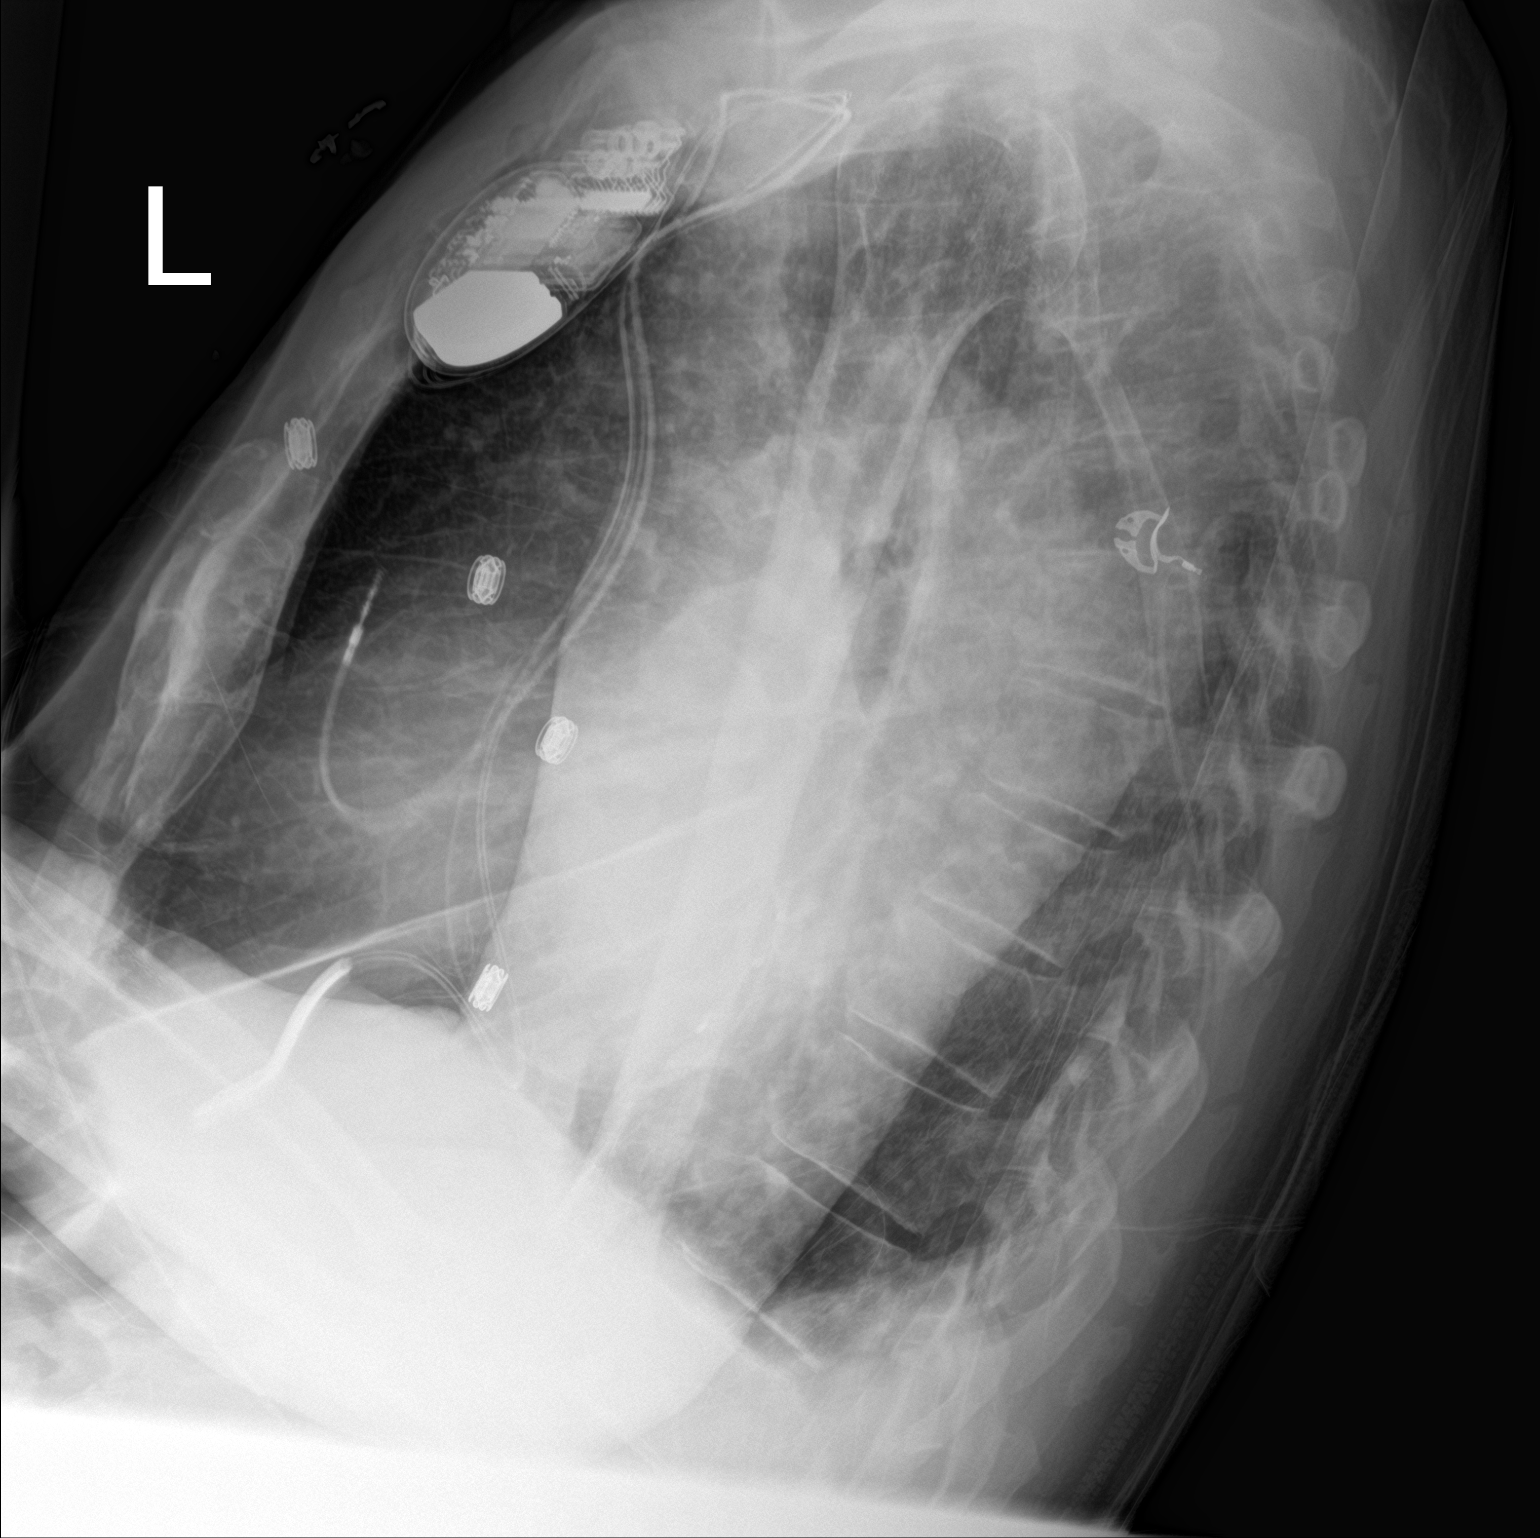

[chest ap]
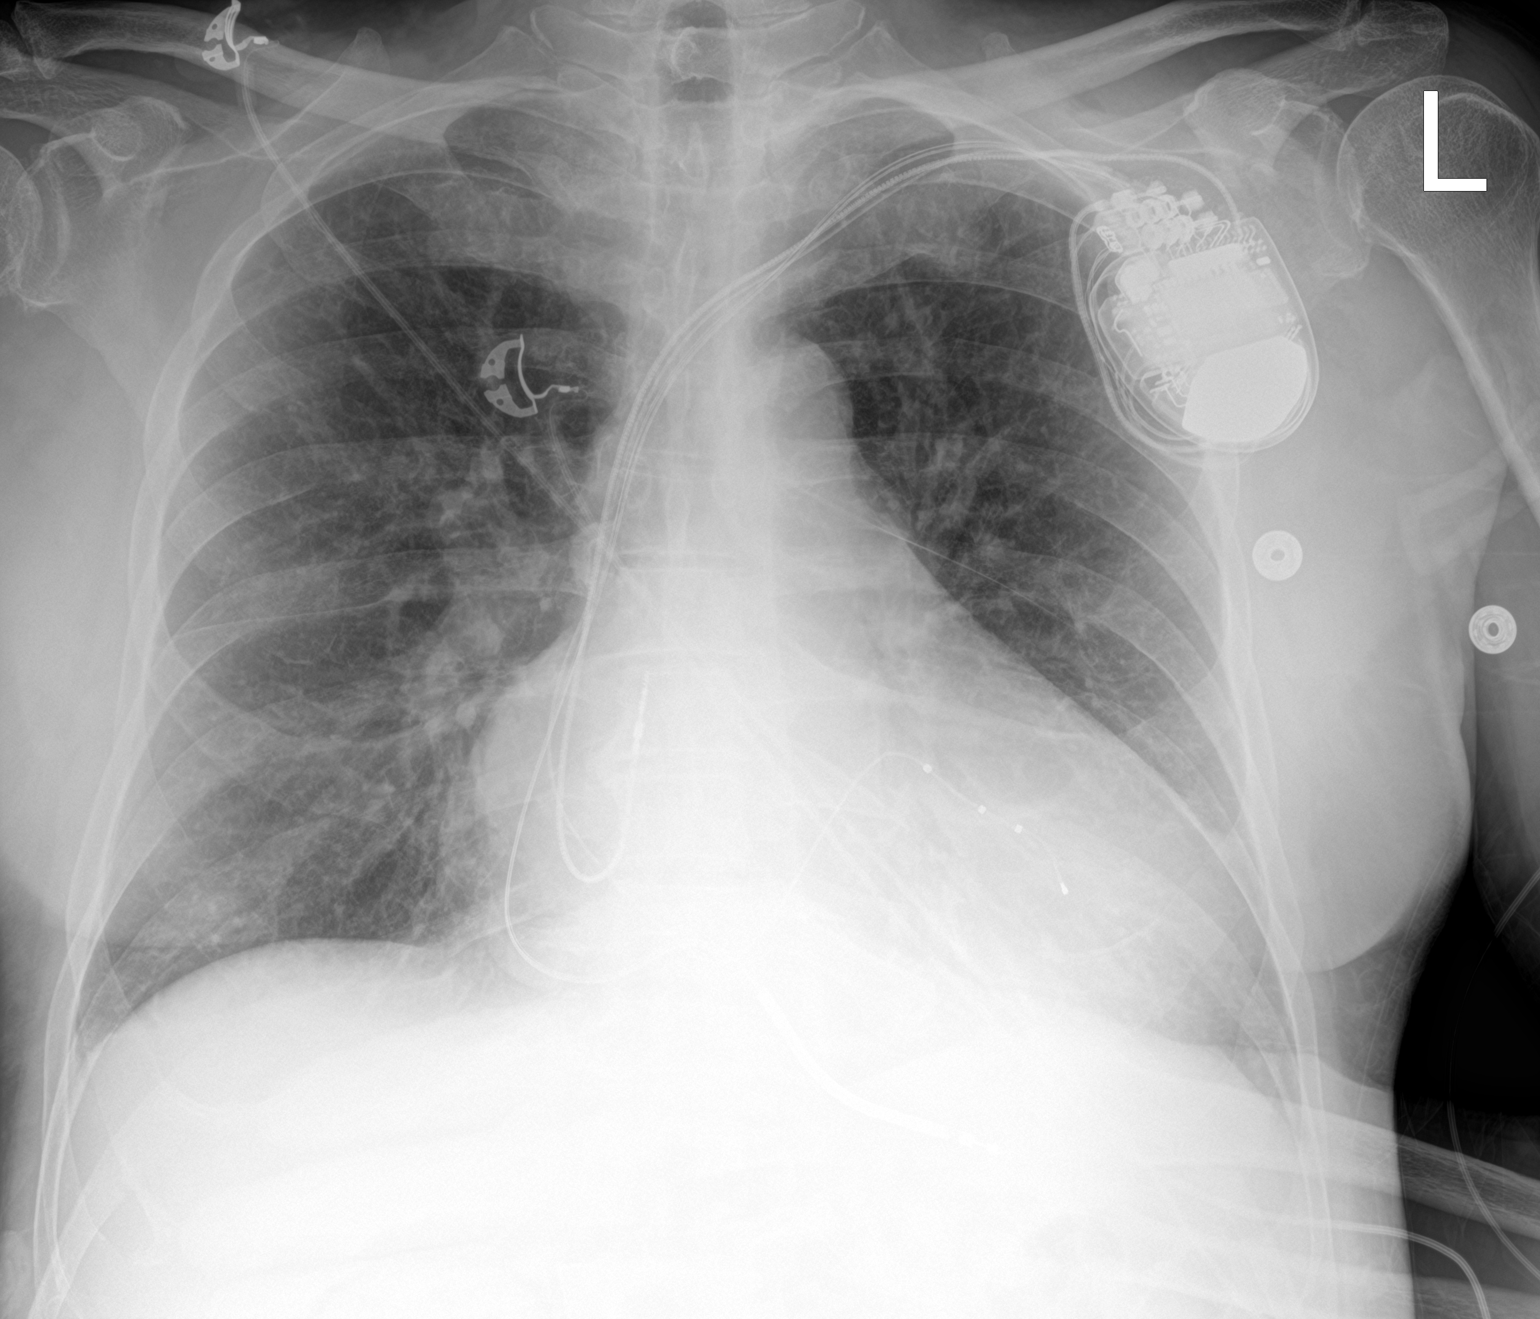

[2 of 2 positions shown; findings below may reference images not displayed]

FINDINGS: Cardiac silhouette is mildly enlarged. No mediastinal or hilar
masses. No evidence of adenopathy. New left anterior chest wall
biventricular cardioverter-defibrillator. Leads appear well
positioned.

Clear lungs.  No pleural effusion or pneumothorax.

Skeletal structures are intact.
IMPRESSION: 1. Well-positioned left anterior chest wall biventricular
cardioverter-defibrillator.
2. Mild cardiomegaly.
3. No acute cardiopulmonary disease.  No pneumothorax.

## 2020-03-23 ENCOUNTER — Other Ambulatory Visit (HOSPITAL_COMMUNITY): Payer: Self-pay | Admitting: Internal Medicine

## 2020-03-30 ENCOUNTER — Other Ambulatory Visit: Payer: Self-pay

## 2020-03-30 ENCOUNTER — Ambulatory Visit: Payer: Medicare Other

## 2020-03-30 DIAGNOSIS — I5022 Chronic systolic (congestive) heart failure: Secondary | ICD-10-CM

## 2020-03-30 DIAGNOSIS — I428 Other cardiomyopathies: Secondary | ICD-10-CM

## 2020-04-05 NOTE — Progress Notes (Signed)
The LV appears to be relatively stable.  Medical therapy for now.  He is on best medical therapy, has been unable to tolerate ARB in the past, may need to reconsider his medications and add very minimal doses every other day and titrate upwards.

## 2020-04-13 NOTE — Progress Notes (Signed)
Primary Physician:  [name], MD   Patient ID: Miguel Patterson, male    DOB: 08-19-1945, 75 y.o.   MRN: [id number]   Chief Complaint  Patient presents with  . Follow-up    3 month  . Congestive Heart Failure   HPI    Miguel Patterson  is a 75 y.o. male  from [place]. He has severe non ischemic dilated cardiomyopathy with severely depressed left-ventricular systolic function, Ejection fraction has been in teens for past 3 years in spite of optimal medical therapy. 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 Underwent BIV ICD CRT insertion on 02/28/2018 with Dr. Caryl Comes for nonischemic cardiomyopathy.  In 06/2019, Dr. Caryl Comes changed BiV ICD settings from adaptive CRT to biventricular pacing.    Patient has also been evaluated by Dr. Haroldine Laws since last visit who titrated patient's heart failure medications and started him on Farxiga.  Patient was also evaluated by Dr. 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  Patient was last seen by Dr. Haroldine Laws on 02/10/2020, and released for follow-up in our office.  Patient now presents for 75-monthfollow-up of heart failure.  Overall patient states he is feeling well.  He does admit that during last month he was experiencing mild decreased exercise tolerance, but was still able to walk 4.5 miles daily.  He states in the last 2-3 weeks he has been feeling quite well and has returned to his normal walking pace.  Patient continues to walk 4.5 miles daily without issue.  Denies chest pain, syncope, near syncope, dizziness.  He does report occasional palpitations approximately once per week that are brief and without associated symptoms.  Patient denies chest pain, orthopnea, PND.  Past Medical History:  Diagnosis Date   . Cardiomegaly   . Chronic combined systolic and diastolic heart failure ([facility] 02/10/2018  . DCM (dilated cardiomyopathy) ([facility]   . Encounter for adjustment of biventricular implantable cardioverter-defibrillator (ICD) 04/03/2019  . Fatigue   . ICD- Biventricular Medtronic ICD Claria Mri [id number] in situ 02/28/18 03/01/2018  . LBBB (left bundle branch block)   . Mild tricuspid regurgitation   . Moderate aortic regurgitation   . Moderate mitral regurgitation   . Pericardial effusion    INSIGNIFICANT  . S/P biventricular cardiac pacemaker procedure 02/28/18 with ICD MDT  03/01/2018  . Short of breath on exertion   . Systolic heart failure ([facility]   . Trace pulmonic regurgitation by prior echocardiogram     Past Surgical History:  Procedure Laterality Date  . BIV ICD INSERTION CRT-D N/A 02/28/2018   Procedure: BIV ICD INSERTION CRT-D;  Surgeon: KDeboraha Sprang MD;  [facility];  Service: Cardiovascular;  Laterality: N/A;  . LEG SURGERY Left 2000   DUE TO BROKE LEG   Family History  Problem Relation Age of Onset  . Hypertension Mother 835 . Thyroid disease Mother   . Healthy Father 941   Social History   Tobacco Use  . Smoking status: Never Smoker  . Smokeless tobacco: Never Used  Substance Use Topics  . Alcohol use: No    Marital Status: Divorced  ROS:   Review of Systems  Constitutional: Negative for malaise/fatigue and weight gain.  Cardiovascular: Positive for dyspnea on exertion (improved). Negative for chest pain, claudication, leg swelling, near-syncope,  orthopnea, palpitations, paroxysmal nocturnal dyspnea and syncope.  Respiratory: Negative for shortness of breath.   Hematologic/Lymphatic: Does not bruise/bleed easily.  Gastrointestinal: Negative for melena.  Genitourinary: Negative for frequency and hesitancy.  Neurological: Negative for dizziness and weakness.   Objective:  Blood pressure 94/61, pulse 71, temperature (!) 97.4 F (36.3 C),  temperature source Temporal, resp. rate 17, height 6' (1.829 m), weight 170 lb 3.2 oz (77.2 kg), SpO2 98 %. Body mass index is 23.08 kg/m.  Vitals with BMI 04/14/2020 02/10/2020 01/15/2020  Height _0  - _1   Weight 170 lbs 3 oz 180 lbs 3 oz 176 lbs 6 oz  BMI 85.27 - 78.24  Systolic 94 235 87  Diastolic 61 70 56  Pulse 71 70 66     Physical Exam Vitals reviewed.  Constitutional:      Appearance: He is well-developed.  HENT:     Head: Normocephalic and atraumatic.  Neck:     Vascular: No carotid bruit.  Cardiovascular:     Rate and Rhythm: Normal rate and regular rhythm.     Pulses: Normal pulses and intact distal pulses.     Heart sounds: Normal heart sounds, S1 normal and S2 normal. No murmur heard. No gallop.      Comments: NO JVD, No leg edema. Pulmonary:     Effort: Pulmonary effort is normal. No accessory muscle usage or respiratory distress.     Breath sounds: Normal breath sounds. No wheezing, rhonchi or rales.  Abdominal:     General: Bowel sounds are normal. There is no distension.     Palpations: Abdomen is soft.  Musculoskeletal:     Right lower leg: No edema.     Left lower leg: No edema.  Neurological:     Mental Status: He is alert.    Laboratory examination:   CMP Latest Ref Rng & Units 02/10/2020 07/13/2019 06/22/2019  Glucose 70 - 99 mg/dL 94 104(H) 105(H)  BUN 8 - 23 mg/dL 38(H) 42(H) 56(H)  Creatinine 0.61 - 1.24 mg/dL 1.51(H) 1.56(H) 1.94(H)  Sodium 135 - 145 mmol/L 140 146(H) 142  Potassium 3.5 - 5.1 mmol/L 4.9 4.3 4.9  Chloride 98 - 111 mmol/L 103 105 100  CO2 22 - 32 mmol/L _2 Calcium 8.9 - 10.3 mg/dL 9.0 9.6 9.5  Total Protein 6.5 - 8.1 g/dL 6.5 - -  Total Bilirubin 0.3 - 1.2 mg/dL 1.0 - -  Alkaline Phos 38 - 126 U/L 64 - -  AST 15 - 41 U/L 19 - -  ALT 0 - 44 U/L 16 - -   CBC Latest Ref Rng & Units 02/10/2020 02/25/2018  WBC 4.0 - 10.5 K/uL 4.5 5.2  Hemoglobin 13.0 - 17.0 g/dL 12.9(L) 14.1  Hematocrit 39.0 - 52.0 % 40.7 43.3  Platelets  150 - 400 K/uL 135(L) 137(L)   Lipid Panel     Component Value Date/Time   CHOL 179 03/20/2019 0804   TRIG 61 03/20/2019 0804   HDL 51 03/20/2019 0804   LDLCALC 116 (H) 03/20/2019 0804   HEMOGLOBIN A1C No results found for: HGBA1C, MPG TSH Recent Labs    04/24/19 0932  TSH 3.360    Current Outpatient Medications on File Prior to Visit  Medication Sig Dispense Refill  . carvedilol (COREG) 3.125 MG tablet TAKE 1 TABLET BY MOUTH TWICE DAILY WITH MEALS 180 tablet 1  . dapagliflozin propanediol (FARXIGA) 10 MG TABS tablet Take 1 tablet (10 mg total) by mouth daily before breakfast.  30 tablet 3  . finasteride (PROSCAR) 5 MG tablet Take 5 mg by mouth daily.    . magnesium oxide (MAG-OX) 400 MG tablet Take 400 mg by mouth 2 (two) times daily.    . tamsulosin (FLOMAX) 0.4 MG CAPS capsule Take 0.4 mg by mouth daily.     Marland Kitchen torsemide (DEMADEX) 20 MG tablet TAKE 1 TABLET(20 MG) BY MOUTH DAILY 30 tablet 11  . triamcinolone ointment (KENALOG) 0.1 % Apply 2 application topically as needed.     No current facility-administered medications on file prior to visit.    BNP (last 3 results) Recent Labs    06/22/19 0916 07/13/19 1031 02/10/20 1055  BNP 631.8* 611.9* 816.6*   Labs not auto populated  into epic 06/22/2019:  Serum glucose 105, BUN 56, creatinine 1.94, EGFR 33 mL.  Sodium 142, potassium 4.9.  BNP 631. Radiology:   No results found.  Cardiac Studies:   Exercise sestamibi stress test 08/27/2014: 1. The resting electrocardiogram demonstrated normal sinus rhythm, incomplete LBBB and no resting arrhythmias. ST segment depression and T-wave inversion in inferior and lateral leads. Stress EKG is negative for ischemia. The patient performed treadmill exercise using a Bruce protocol, completing 6:26 minutes. Thre were occasional PVCs. The patient completed an estimated workload of 7.65 METS, 95% of the maximum predicted heart rate. The stress test was terminated because of dyspnea. 2.  The LV is markedly dilated both at rest and stress images. The LV end diastolic volume was 892JJ. Thre is anterior wall thinning. There is no evidence of ischemia. Thre is severe generalized hypokinesis and LVEF markedly depressed at 11%. Findings are consistent with dilated cardiomyopathy. Clinical correlation is recommended.  Cardiac MRI 11/20/2017: 1. Severely dilated left ventricle with normal wall thickness and severely decreased systolic function (LVEF = 14%). There is severe diffuse hypokinesis and paradoxical septal motion consistent with LBBB. The ratio of non-compacted to compacted myocardium in the apical and mid portions of the ventricle is > 4:1. There is midwall late gadolinium enhancement in the basal anteroseptal and inferoseptal myocardium. 2. Normal right ventricular size, thickness and systolic function (LVEF = 55%). There are no regional wall motion abnormalities. 3. Severely dilated left atrium (59 mm), normal right atrial size. 4. Moderately dilated pulmonary artery measuring 36 mm. 5. Moderate aortic and mitral regurgitation, mild tricuspid regurgitation. 6. Mild pericardial effusion. Collectively, these findings are consistent with end stage non-compaction cardiomyopathy.  Nocturnal oximetry 05/20/2019: SpO2 <88%:  53 minutes SpO2 <89%:  77 minutes Highest SPO2 99% and lowest SPO2 76%.  Oxygen desaturation events 225, desaturation index 25. Impression: Patient qualifies for nocturnal oxygen supplementation per Medicare guidelines Group 1.  Echocardiogram 10/09/2019: Severely depressed LV systolic function with visual EF 25-30%. Left ventricle cavity is severely dilated. Severe global hypokinesis.  Doppler evidence of grade II diastolic dysfunction, indeterminate LAP.  Left atrial cavity is severely dilated 70.58m/m2. Endocardial wire noted within the right cardiac chambers.  Mild to moderate aortic regurgitation. Moderate to severe mitral regurgitation. Moderate  tricuspid regurgitation. Mild pulmonary hypertension. RVSP measures 48 mmHg. Mild pulmonic regurgitation. Moderate pericardial effusion. There is no hemodynamic significance. IVC is dilated with a respiratory response of >50%. Compared prior study 03/05/2019: LVEF has improved from <20% to 25-30%, Grade 3DD is now Grade 2 diastolic dysfunction, pulmonary hypertension per RVSP has improved,  otherwise no significant change.   Cardiopulmonary exercise test 11/05/2019: Exercise testing with gas exchange demonstrates normal functional capacity when compared to matched sedentary norms. There is a mild HF  limitation with mildly elevated VE/VCO2 slope. There is likely an additional pulmonary limitation component as patient is ventilatory limited at peak exercise and pre-exercise spirometry demonstrates mild restrictive lung physiology.   PCV ECHOCARDIOGRAM COMPLETE 03/30/2020 Severely depressed LV systolic function with visual EF <20%. Left ventricle cavity is severely dilated. Hypokinetic global wall motion. Doppler evidence of grade III (restrictive) diastolic dysfunction, elevated LAP. Calculated EF 31%. Endocardial wires noted within the right cardiac chambers. Left atrial cavity is severely dilated. Mild to moderate aortic regurgitation. Posterior directed jet, wraps the left atrial wall, severe (Grade III) mitral regurgitation. Moderate to severe tricuspid regurgitation. Moderate pulmonary hypertension. RVSP measures 52 mmHg. IVC is dilated with a respiratory response of >50%. Compared to prior study dated 10/09/2019: G2DD is now G3DD, moderate to severe MR is now severe, mild PHTN is now moderate.  EKG:  04/15/2020: AV paced rhythm at a rate of 70 bpm with single PVC.  No further analysis.  03/18/2019: Probably AV paced rhythm with first-degree AV block, biventricular pacemaker detected.  No further analysis. Single PVC.  Assessment:     ICD-10-CM   1. Left ventricular non-compaction  cardiomyopathy (HCC)  I42.8   2. Chronic systolic heart failure (HCC)  M62.94 EKG 12-Lead    Basic metabolic panel  3. ICD- Biventricular Medtronic ICD Claria Mri [id number] in situ 02/28/18  Z95.810   4. Congestive heart failure, NYHA class 2, chronic, systolic (HCC)  I50.22 losartan (COZAAR) 25 MG tablet    Meds ordered this encounter  Medications  . losartan (COZAAR) 25 MG tablet    Sig: Take 1 tablet (25 mg total) by mouth every other day.    Dispense:  90 tablet    Refill:  1    ZERO refills remain on this prescription. Your patient is requesting advance approval of refills for this medication to PREVENT ANY MISSED DOSES  . spironolactone (ALDACTONE) 25 MG tablet    Sig: Take 0.5 tablets (12.5 mg total) by mouth every other day.    Dispense:  30 tablet    Refill:  2   Medications Discontinued During This Encounter  Medication Reason  . losartan (COZAAR) 25 MG tablet      Recommendations:   Miguel Patterson  is a 75 y.o. from [place]. He has severe non ischemic dilated cardiomyopathy with severely depressed left-ventricular systolic function, Ejection fraction has been in teens for past 3 years in spite of optimal medical therapy. 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 Underwent BIV ICD CRT insertion on 02/28/2018 with Dr. Graciela Husbands for nonischemic cardiomyopathy.   Patient has also been evaluated by Dr. Gala Romney since last visit who titrated patient's heart failure medications and started him on Farxiga.  Patient was also evaluated by Dr. 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  Patient was last seen by Dr. Gala Romney on 02/10/2020, and released for follow-up in our office.  Patient now presents for 60-month follow-up of heart failure.  Discussed and  reviewed results of echocardiogram, details above.  Left ventricle appears to be relatively stable, therefore recommend continued medical therapy at this point.  Patient is doing well overall without clinical signs of acute heart failure decompensation.  Patient is tolerating guideline directed medical therapy well including carvedilol, Farxiga, losartan.  Patient's blood pressure is soft, however he remains asymptomatic.  Goal is to get patient on complete guideline directed medical therapy, therefore would like to add spironolactone.  However in view of patient's soft blood pressure advised him to take losartan 25 mg every other day and start taking spironolactone 12.5 mg on the days that he does not take losartan.  We will plan to repeat BMP in 1 week.  Patient will continue to monitor for symptoms of hypotension.  He will monitor his blood pressure on a daily basis and bring a written log to his next appointment.  Follow-up in 6 weeks, sooner if needed, for heart failure and medication titration.   Alethia Berthold, PA-C 04/15/2020, 10:51 AM Office: [phone]

## 2020-04-14 ENCOUNTER — Other Ambulatory Visit: Payer: Self-pay

## 2020-04-14 ENCOUNTER — Ambulatory Visit: Payer: Medicare Other | Admitting: Cardiology

## 2020-04-14 ENCOUNTER — Ambulatory Visit: Payer: Medicare Other | Admitting: Student

## 2020-04-14 ENCOUNTER — Encounter: Payer: Self-pay | Admitting: Student

## 2020-04-14 VITALS — BP 94/61 | HR 71 | Temp 97.4°F | Resp 17 | Ht 72.0 in | Wt 170.2 lb

## 2020-04-14 DIAGNOSIS — I5022 Chronic systolic (congestive) heart failure: Secondary | ICD-10-CM

## 2020-04-14 DIAGNOSIS — Z95810 Presence of automatic (implantable) cardiac defibrillator: Secondary | ICD-10-CM

## 2020-04-14 DIAGNOSIS — I428 Other cardiomyopathies: Secondary | ICD-10-CM

## 2020-04-14 MED ORDER — LOSARTAN POTASSIUM 25 MG PO TABS: 25.0000 mg | ORAL_TABLET | ORAL | 1 refills | Status: DC

## 2020-04-14 MED ORDER — SPIRONOLACTONE 25 MG PO TABS: 12.5000 mg | ORAL_TABLET | ORAL | 2 refills | Status: DC

## 2020-04-14 NOTE — Patient Instructions (Signed)
Take losartan 25 mg every other day.  Take spironolactone 12.5 mg every other day.  Go to [facility] in 1 week to recheck kidney function.  Keep log of weight and blood pressure.

## 2020-04-19 ENCOUNTER — Other Ambulatory Visit: Payer: Self-pay | Admitting: Cardiology

## 2020-05-13 LAB — BASIC METABOLIC PANEL
BUN/Creatinine Ratio: 18 (ref 10–24)
BUN: 31 mg/dL — ABNORMAL HIGH (ref 8–27)
CO2: 24 mmol/L (ref 20–29)
Calcium: 9.3 mg/dL (ref 8.6–10.2)
Chloride: 99 mmol/L (ref 96–106)
Creatinine, Ser: 1.71 mg/dL — ABNORMAL HIGH (ref 0.76–1.27)
Glucose: 100 mg/dL — ABNORMAL HIGH (ref 65–99)
Potassium: 4.9 mmol/L (ref 3.5–5.2)
Sodium: 143 mmol/L (ref 134–144)
eGFR: 41 mL/min/{1.73_m2} — ABNORMAL LOW (ref >59)

## 2020-05-13 NOTE — Progress Notes (Signed)
Attempted to call pt, no answer. Left vm requesting call back.

## 2020-05-13 NOTE — Progress Notes (Signed)
Please advise patient to stop taking spironolactone and go back to taking losartan daily as his renal function has deteriorated some with initiation of spironolactone.

## 2020-05-17 NOTE — Progress Notes (Signed)
Called pt to inform him about his lab results and to stop the spironolactone and to start the losartan

## 2020-05-25 NOTE — Progress Notes (Signed)
Primary Physician:  [name], MD   Patient ID: Miguel Patterson, male    DOB: [birth year], 75 y.o.   MRN: [id number]   No chief complaint on file.  HPI    Miguel Patterson  is a 75 y.o. male  from [place]. He has severe non ischemic dilated cardiomyopathy with severely depressed left-ventricular systolic function, Ejection fraction has been in teens for past 3 years in spite of optimal medical therapy. 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 Underwent BIV ICD CRT insertion on 02/28/2018 with Dr. Caryl Comes for nonischemic cardiomyopathy.  In 06/2019, Dr. Caryl Comes changed BiV ICD settings from adaptive CRT to biventricular pacing.    Patient has also been evaluated by Dr. Haroldine Laws who titrated patient's heart failure medications and started him on Farxiga.  Patient was also evaluated by Dr. 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  Patient presents for 6-week follow-up of heart failure medication titration.  At last visit switched losartan 25 mg to every other day and initiated spironolactone 12.5 mg on the days he did not take losartan.  However repeat BMP revealed deterioration of renal function.  Therefore spironolactone was discontinued and patient was advised to continue taking losartan daily. Patient brings with him log of home weight, blood pressure, and heart rate.  Blood pressure remains soft, however patient is asymptomatic.  He continues to walk 4-7 miles per day without issue. Denies chest pain, syncope, near syncope, dizziness. Patient denies leg swelling, orthopnea, PND.  Past Medical History:  Diagnosis Date  . Cardiomegaly   . Chronic combined systolic and diastolic heart failure ([place]) 02/10/2018  . DCM (dilated cardiomyopathy) ([place])   .  Encounter for adjustment of biventricular implantable cardioverter-defibrillator (ICD) 04/03/2019  . Fatigue   . ICD- Biventricular Medtronic ICD Claria Mri [id number] in situ 02/28/18 03/01/2018  . LBBB (left bundle branch block)   . Mild tricuspid regurgitation   . Moderate aortic regurgitation   . Moderate mitral regurgitation   . Pericardial effusion    INSIGNIFICANT  . S/P biventricular cardiac pacemaker procedure 02/28/18 with ICD MDT  03/01/2018  . Short of breath on exertion   . Systolic heart failure ([place])   . Trace pulmonic regurgitation by prior echocardiogram     Past Surgical History:  Procedure Laterality Date  . BIV ICD INSERTION CRT-D N/A 02/28/2018   Procedure: BIV ICD INSERTION CRT-D;  Surgeon: Deboraha Sprang, MD;  Location: [facility];  Service: Cardiovascular;  Laterality: N/A;  . LEG SURGERY Left 2000   DUE TO BROKE LEG   Family History  Problem Relation Age of Onset  . Hypertension Mother 51  . Thyroid disease Mother   . Healthy Father 24    Social History   Tobacco Use  . Smoking status: Never Smoker  . Smokeless tobacco: Never Used  Substance Use Topics  . Alcohol use: No    Marital Status: Divorced  ROS:   Review of Systems  Constitutional: Negative for malaise/fatigue and weight gain.  Cardiovascular: Negative for chest pain, claudication, dyspnea on exertion (improved), leg swelling, near-syncope, orthopnea, palpitations, paroxysmal nocturnal dyspnea and syncope.  Respiratory: Negative for shortness of breath.   Hematologic/Lymphatic: Does not bruise/bleed easily.  Gastrointestinal: Negative for melena.  Genitourinary: Negative for frequency and hesitancy.  Neurological: Negative for dizziness  and weakness.   Objective:  There were no vitals taken for this visit. There is no height or weight on file to calculate BMI.  Vitals with BMI 04/14/2020 02/10/2020 01/15/2020  Height _0  - _1   Weight 170 lbs 3 oz 180 lbs 3 oz 176 lbs 6 oz  BMI  99.37 - 16.96  Systolic 94 789 87  Diastolic 61 70 56  Pulse 71 70 66     Physical Exam Vitals reviewed.  Constitutional:      Appearance: He is well-developed.  HENT:     Head: Normocephalic and atraumatic.  Neck:     Vascular: No carotid bruit or JVD.  Cardiovascular:     Rate and Rhythm: Normal rate and regular rhythm.     Pulses: Normal pulses and intact distal pulses.     Heart sounds: Normal heart sounds, S1 normal and S2 normal. No murmur heard. No gallop.      Comments: NO JVD, No leg edema. Pulmonary:     Effort: Pulmonary effort is normal. No accessory muscle usage or respiratory distress.     Breath sounds: Normal breath sounds. No wheezing, rhonchi or rales.  Musculoskeletal:     Right lower leg: No edema.     Left lower leg: No edema.  Neurological:     Mental Status: He is alert.    Laboratory examination:   CMP Latest Ref Rng & Units 05/12/2020 02/10/2020 07/13/2019  Glucose 65 - 99 mg/dL 100(H) 94 104(H)  BUN 8 - 27 mg/dL 31(H) 38(H) 42(H)  Creatinine 0.76 - 1.27 mg/dL 1.71(H) 1.51(H) 1.56(H)  Sodium 134 - 144 mmol/L 143 140 146(H)  Potassium 3.5 - 5.2 mmol/L 4.9 4.9 4.3  Chloride 96 - 106 mmol/L 99 103 105  CO2 20 - 29 mmol/L _2 Calcium 8.6 - 10.2 mg/dL 9.3 9.0 9.6  Total Protein 6.5 - 8.1 g/dL - 6.5 -  Total Bilirubin 0.3 - 1.2 mg/dL - 1.0 -  Alkaline Phos 38 - 126 U/L - 64 -  AST 15 - 41 U/L - 19 -  ALT 0 - 44 U/L - 16 -   CBC Latest Ref Rng & Units 02/10/2020 02/25/2018  WBC 4.0 - 10.5 K/uL 4.5 5.2  Hemoglobin 13.0 - 17.0 g/dL 12.9(L) 14.1  Hematocrit 39.0 - 52.0 % 40.7 43.3  Platelets 150 - 400 K/uL 135(L) 137(L)   Lipid Panel     Component Value Date/Time   CHOL 179 03/20/2019 0804   TRIG 61 03/20/2019 0804   HDL 51 03/20/2019 0804   LDLCALC 116 (H) 03/20/2019 0804   HEMOGLOBIN A1C No results found for: HGBA1C, MPG TSH No results for input(s): TSH in the last 8760 hours.  Current Outpatient Medications on File Prior to Visit   Medication Sig Dispense Refill  . carvedilol (COREG) 3.125 MG tablet TAKE 1 TABLET BY MOUTH TWICE DAILY WITH MEALS 180 tablet 1  . dapagliflozin propanediol (FARXIGA) 10 MG TABS tablet Take 1 tablet (10 mg total) by mouth daily before breakfast. 30 tablet 3  . finasteride (PROSCAR) 5 MG tablet Take 5 mg by mouth daily.    Marland Kitchen losartan (COZAAR) 25 MG tablet Take 1 tablet (25 mg total) by mouth every other day. 90 tablet 1  . magnesium oxide (MAG-OX) 400 MG tablet Take 400 mg by mouth 2 (two) times daily.    Marland Kitchen spironolactone (ALDACTONE) 25 MG tablet Take 0.5 tablets (12.5 mg total) by mouth every other day. 30 tablet 2  .  tamsulosin (FLOMAX) 0.4 MG CAPS capsule Take 0.4 mg by mouth daily.     Marland Kitchen torsemide (DEMADEX) 20 MG tablet TAKE 1 TABLET(20 MG) BY MOUTH DAILY 30 tablet 11  . triamcinolone ointment (KENALOG) 0.1 % Apply 2 application topically as needed.     No current facility-administered medications on file prior to visit.    BNP (last 3 results) Recent Labs    06/22/19 0916 07/13/19 1031 02/10/20 1055  BNP 631.8* 611.9* 816.6*   Labs not auto populated  into epic 06/22/2019:  Serum glucose 105, BUN 56, creatinine 1.94, EGFR 33 mL.  Sodium 142, potassium 4.9.  BNP 631. Radiology:   No results found.  Cardiac Studies:   Exercise sestamibi stress test 08/27/2014: 1. The resting electrocardiogram demonstrated normal sinus rhythm, incomplete LBBB and no resting arrhythmias. ST segment depression and T-wave inversion in inferior and lateral leads. Stress EKG is negative for ischemia. The patient performed treadmill exercise using a Bruce protocol, completing 6:26 minutes. Thre were occasional PVCs. The patient completed an estimated workload of 7.65 METS, 95% of the maximum predicted heart rate. The stress test was terminated because of dyspnea. 2. The LV is markedly dilated both at rest and stress images. The LV end diastolic volume was 222LN. Thre is anterior wall thinning. There is  no evidence of ischemia. Thre is severe generalized hypokinesis and LVEF markedly depressed at 11%. Findings are consistent with dilated cardiomyopathy. Clinical correlation is recommended.  Cardiac MRI 11/20/2017: 1. Severely dilated left ventricle with normal wall thickness and severely decreased systolic function (LVEF = 14%). There is severe diffuse hypokinesis and paradoxical septal motion consistent with LBBB. The ratio of non-compacted to compacted myocardium in the apical and mid portions of the ventricle is > 4:1. There is midwall late gadolinium enhancement in the basal anteroseptal and inferoseptal myocardium. 2. Normal right ventricular size, thickness and systolic function (LVEF = 55%). There are no regional wall motion abnormalities. 3. Severely dilated left atrium (59 mm), normal right atrial size. 4. Moderately dilated pulmonary artery measuring 36 mm. 5. Moderate aortic and mitral regurgitation, mild tricuspid regurgitation. 6. Mild pericardial effusion. Collectively, these findings are consistent with end stage non-compaction cardiomyopathy.  Nocturnal oximetry 05/20/2019: SpO2 <88%:  53 minutes SpO2 <89%:  77 minutes Highest SPO2 99% and lowest SPO2 76%.  Oxygen desaturation events 225, desaturation index 25. Impression: Patient qualifies for nocturnal oxygen supplementation per Medicare guidelines Group 1.  Echocardiogram 10/09/2019: Severely depressed LV systolic function with visual EF 25-30%. Left ventricle cavity is severely dilated. Severe global hypokinesis.  Doppler evidence of grade II diastolic dysfunction, indeterminate LAP.  Left atrial cavity is severely dilated 70.27m/m2. Endocardial wire noted within the right cardiac chambers.  Mild to moderate aortic regurgitation. Moderate to severe mitral regurgitation. Moderate tricuspid regurgitation. Mild pulmonary hypertension. RVSP measures 48 mmHg. Mild pulmonic regurgitation. Moderate pericardial effusion.  There is no hemodynamic significance. IVC is dilated with a respiratory response of >50%. Compared prior study 03/05/2019: LVEF has improved from <20% to 25-30%, Grade 3DD is now Grade 2 diastolic dysfunction, pulmonary hypertension per RVSP has improved,  otherwise no significant change.   Cardiopulmonary exercise test 11/05/2019: Exercise testing with gas exchange demonstrates normal functional capacity when compared to matched sedentary norms. There is a mild HF limitation with mildly elevated VE/VCO2 slope. There is likely an additional pulmonary limitation component as patient is ventilatory limited at peak exercise and pre-exercise spirometry demonstrates mild restrictive lung physiology.   PCV ECHOCARDIOGRAM COMPLETE 03/30/2020 Severely depressed LV  systolic function with visual EF <20%. Left ventricle cavity is severely dilated. Hypokinetic global wall motion. Doppler evidence of grade III (restrictive) diastolic dysfunction, elevated LAP. Calculated EF 31%. Endocardial wires noted within the right cardiac chambers. Left atrial cavity is severely dilated. Mild to moderate aortic regurgitation. Posterior directed jet, wraps the left atrial wall, severe (Grade III) mitral regurgitation. Moderate to severe tricuspid regurgitation. Moderate pulmonary hypertension. RVSP measures 52 mmHg. IVC is dilated with a respiratory response of >50%. Compared to prior study dated 10/09/2019: G2DD is now G3DD, moderate to severe MR is now severe, mild PHTN 32mHg is now moderate.   EKG   04/15/2020: AV paced rhythm at a rate of 70 bpm with single PVC.  No further analysis.  03/18/2019: Probably AV paced rhythm with first-degree AV block, biventricular pacemaker detected.  No further analysis. Single PVC.   ICD   Schedule remote ICD transmission 04/03/2020: Longevity >7 years.  A paced 85%, BiV paced 91%.  Mode switches.  Ventricular sensing episodes reveal brief atrial tachycardia.  Impedance  monitoring does not suggest fluid accumulation.  Assessment:   No diagnosis found.  No orders of the defined types were placed in this encounter.  There are no discontinued medications.   Recommendations:   Axcel C Chausse  is a 75y.o[place] He has severe non ischemic dilated cardiomyopathy with severely depressed left-ventricular systolic function, Ejection fraction has been in teens for past 3 years in spite of optimal medical therapy. 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 Underwent BIV ICD CRT insertion on 02/28/2018 with Dr. KCaryl Comesfor nonischemic cardiomyopathy.   Patient has also been evaluated by Dr. BHaroldine Lawswho titrated patient's heart failure medications and started him on Farxiga.  Patient was also evaluated by Dr. 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  Patient presents for 6-week follow-up of heart failure medication titration.  At last visit switched losartan 25 mg to every other day and initiated spironolactone 12.5 mg on the days he did not take losartan.  However repeat BMP revealed deterioration of renal function.  Therefore spironolactone was discontinued and patient was advised to continue taking losartan daily.  Reviewed and discussed with patient regarding recent ICD transmission, which revealed normal function.  Although patient's blood pressure is soft he remains asymptomatic, will not make changes to his medications at this time.  Despite severely depressed LV systolic function patient is doing well and remains very active walking daily without issue.  He is relatively stable despite underlying cardiomyopathy.  We will continue to follow-up closely, will not make changes at this time.  Follow up in 3 months, sooner if  needed, for heart failure and cardiomyopathy.   CAlethia Berthold PA-C 05/26/2020, 2:30 PM Office: [phone]

## 2020-05-26 ENCOUNTER — Encounter: Payer: Self-pay | Admitting: Student

## 2020-05-26 ENCOUNTER — Other Ambulatory Visit: Payer: Self-pay

## 2020-05-26 ENCOUNTER — Ambulatory Visit: Payer: Medicare Other | Admitting: Student

## 2020-05-26 VITALS — BP 101/67 | HR 70 | Ht 72.0 in | Wt 171.0 lb

## 2020-05-26 DIAGNOSIS — I5022 Chronic systolic (congestive) heart failure: Secondary | ICD-10-CM

## 2020-05-26 DIAGNOSIS — Z95810 Presence of automatic (implantable) cardiac defibrillator: Secondary | ICD-10-CM

## 2020-06-03 ENCOUNTER — Other Ambulatory Visit: Payer: Self-pay | Admitting: Internal Medicine

## 2020-06-03 DIAGNOSIS — I5022 Chronic systolic (congestive) heart failure: Secondary | ICD-10-CM

## 2020-06-04 ENCOUNTER — Other Ambulatory Visit: Payer: Self-pay | Admitting: Cardiology

## 2020-06-20 ENCOUNTER — Encounter: Payer: Self-pay | Admitting: Student

## 2020-06-20 ENCOUNTER — Ambulatory Visit: Payer: Medicare Other | Admitting: Student

## 2020-06-20 ENCOUNTER — Other Ambulatory Visit: Payer: Self-pay

## 2020-06-20 VITALS — BP 125/74 | HR 66 | Temp 97.8°F | Ht 72.0 in | Wt 178.0 lb

## 2020-06-20 DIAGNOSIS — I5023 Acute on chronic systolic (congestive) heart failure: Secondary | ICD-10-CM

## 2020-06-20 DIAGNOSIS — R0609 Other forms of dyspnea: Secondary | ICD-10-CM

## 2020-06-20 MED ORDER — TORSEMIDE 20 MG PO TABS
ORAL_TABLET | ORAL | 3 refills | Status: DC
Start: 2020-06-20 — End: 2020-07-14

## 2020-06-20 NOTE — Progress Notes (Signed)
Primary Physician:  [name], MD   Patient ID: Miguel Patterson, male    DOB: [birth year], 75 y.o.   MRN: [id number]   Chief Complaint  Patient presents with   Shortness of Breath   Follow-up   pt needs med refills    HPI    Miguel Patterson  is a 75 y.o. male  from [place]. He has severe non ischemic dilated cardiomyopathy with severely depressed left-ventricular systolic function, Ejection fraction has been in teens for past 3 years in spite of optimal medical therapy. 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 Underwent BIV ICD CRT insertion on 02/28/2018 with Dr. Caryl Comes for nonischemic cardiomyopathy.  In 06/2019, Dr. Caryl Comes changed BiV ICD settings from adaptive CRT to biventricular pacing.    Patient has also been evaluated by Dr. Haroldine Laws who titrated patient's heart failure medications and started him on Farxiga.  Patient was also evaluated by Dr. 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  Patient presents for urgent visit at his request with complaints of worsening shortness of breath over the last 2 weeks. Patient states that about 12 days ago he ran out of torsemide and has not been taking it. Since then he has had worsening dyspnea and fatigue as well as bilateral lower leg edema. Denies chest pain, syncope, near-syncope, PND, orthopnea.   Past Medical History:  Diagnosis Date   Cardiomegaly    Chronic combined systolic and diastolic heart failure ([place]) 02/10/2018   DCM (dilated cardiomyopathy) ([place])    Encounter for adjustment of biventricular implantable cardioverter-defibrillator (ICD) 04/03/2019   Fatigue    ICD- Biventricular Medtronic ICD Claria Mri [id number] in situ 02/28/18 03/01/2018   LBBB (left bundle branch block)    Mild  tricuspid regurgitation    Moderate aortic regurgitation    Moderate mitral regurgitation    Pericardial effusion    INSIGNIFICANT   S/P biventricular cardiac pacemaker procedure 02/28/18 with ICD MDT  03/01/2018   Short of breath on exertion    Systolic heart failure (HCC)    Trace pulmonic regurgitation by prior echocardiogram     Past Surgical History:  Procedure Laterality Date   BIV ICD INSERTION CRT-D N/A 02/28/2018   Procedure: BIV ICD INSERTION CRT-D;  Surgeon: Deboraha Sprang, MD;  Location: [facility];  Service: Cardiovascular;  Laterality: N/A;   LEG SURGERY Left 2000   DUE TO BROKE LEG   Family History  Problem Relation Age of Onset   Hypertension Mother 73   Thyroid disease Mother    Healthy Father 80    Social History   Tobacco Use   Smoking status: Never   Smokeless tobacco: Never  Substance Use Topics   Alcohol use: No    Marital Status: Divorced  ROS:   Review of Systems  Constitutional: Positive for malaise/fatigue. Negative for weight gain.  Cardiovascular:  Positive for dyspnea on exertion and leg swelling. Negative for chest pain, claudication, near-syncope, orthopnea, palpitations, paroxysmal nocturnal dyspnea and syncope.  Respiratory:  Negative for shortness of breath.   Neurological:  Negative for dizziness.  Objective:  Blood pressure 125/74, pulse 66, temperature 97.8 F (36.6 C), height 6' (1.829 m), weight 178 lb (80.7 kg), SpO2 100 %. Body mass index is 24.14 kg/m.  Vitals with BMI 06/20/2020 05/26/2020 04/14/2020  Height _0  _1  _2   Weight 178 lbs 171 lbs 170 lbs 3 oz  BMI 24.14 41.32 44.01  Systolic 027 253 94  Diastolic 74 67 61  Pulse 66 70 71     Physical Exam Vitals reviewed.  Constitutional:      Appearance: He is well-developed.  HENT:     Head: Normocephalic and atraumatic.  Neck:     Vascular: No carotid bruit or JVD.  Cardiovascular:     Rate and Rhythm: Normal rate and regular rhythm.     Pulses: Normal  pulses and intact distal pulses.     Heart sounds: Normal heart sounds, S1 normal and S2 normal. No murmur heard.   No gallop.  Pulmonary:     Effort: Pulmonary effort is normal. No accessory muscle usage or respiratory distress.     Breath sounds: Rales (bilateral bases) present. No wheezing or rhonchi.  Abdominal:     General: There is distension.  Musculoskeletal:     Right lower leg: Edema (1+ pitting) present.     Left lower leg: Edema (1+ pitting, right worse than left) present.  Neurological:     Mental Status: He is alert.   Laboratory examination:   CMP Latest Ref Rng & Units 06/20/2020 05/12/2020 02/10/2020  Glucose 65 - 99 mg/dL 121(H) 100(H) 94  BUN 8 - 27 mg/dL 27 31(H) 38(H)  Creatinine 0.76 - 1.27 mg/dL 1.60(H) 1.71(H) 1.51(H)  Sodium 134 - 144 mmol/L 145(H) 143 140  Potassium 3.5 - 5.2 mmol/L 5.4(H) 4.9 4.9  Chloride 96 - 106 mmol/L 110(H) 99 103  CO2 20 - 29 mmol/L _3 Calcium 8.6 - 10.2 mg/dL 9.3 9.3 9.0  Total Protein 6.5 - 8.1 g/dL - - 6.5  Total Bilirubin 0.3 - 1.2 mg/dL - - 1.0  Alkaline Phos 38 - 126 U/L - - 64  AST 15 - 41 U/L - - 19  ALT 0 - 44 U/L - - 16   CBC Latest Ref Rng & Units 02/10/2020 02/25/2018  WBC 4.0 - 10.5 K/uL 4.5 5.2  Hemoglobin 13.0 - 17.0 g/dL 12.9(L) 14.1  Hematocrit 39.0 - 52.0 % 40.7 43.3  Platelets 150 - 400 K/uL 135(L) 137(L)   Lipid Panel     Component Value Date/Time   CHOL 179 03/20/2019 0804   TRIG 61 03/20/2019 0804   HDL 51 03/20/2019 0804   LDLCALC 116 (H) 03/20/2019 0804   HEMOGLOBIN A1C No results found for: HGBA1C, MPG TSH No results for input(s): TSH in the last 8760 hours.  Current Outpatient Medications on File Prior to Visit  Medication Sig Dispense Refill   carvedilol (COREG) 3.125 MG tablet TAKE 1 TABLET BY MOUTH TWICE DAILY WITH MEALS 180 tablet 1   dapagliflozin propanediol (FARXIGA) 10 MG TABS tablet Take 1 tablet (10 mg total) by mouth daily before breakfast. 30 tablet 3   losartan (COZAAR) 25 MG  tablet Take 1 tablet (25 mg total) by mouth every other day. 90 tablet 1   magnesium oxide (MAG-OX) 400 MG tablet Take 400 mg by mouth 2 (two) times daily.     triamcinolone ointment (KENALOG) 0.1 % Apply 2 application topically as needed.     No current facility-administered medications on file prior to visit.    BNP (last 3 results) Recent Labs    07/13/19 1031 02/10/20 1055 06/20/20 1431  BNP 611.9* 816.6* 2,191.9*   Labs not auto populated  into epic 06/22/2019:  Serum glucose 105,  BUN 56, creatinine 1.94, EGFR 33 mL.  Sodium 142, potassium 4.9.  BNP 631. Radiology:   No results found.  Cardiac Studies:   Exercise sestamibi stress test 08/27/2014: 1. The resting electrocardiogram demonstrated normal sinus rhythm, incomplete LBBB and no resting arrhythmias.  ST segment depression and T-wave inversion in inferior and lateral leads. Stress EKG is negative for ischemia. The patient performed treadmill exercise using a Bruce protocol, completing 6:26 minutes. Thre were occasional PVCs. The patient completed an estimated workload of 7.65 METS, 95% of the maximum predicted heart rate. The stress test was terminated because of dyspnea. 2. The LV is markedly dilated both at rest and stress images. The LV end diastolic volume was 027XA. Thre is anterior wall thinning. There is no evidence of ischemia. Thre is severe generalized hypokinesis and LVEF markedly depressed at 11%.  Findings are consistent with dilated cardiomyopathy.  Clinical correlation is recommended.   Cardiac MRI 11/20/2017: 1. Severely dilated left ventricle with normal wall thickness and severely decreased systolic function (LVEF = 14%). There is severe diffuse hypokinesis and paradoxical septal motion consistent with LBBB. The ratio of non-compacted to compacted myocardium in the apical and mid portions of the ventricle is > 4:1. There is midwall late gadolinium enhancement in the basal anteroseptal and inferoseptal  myocardium. 2. Normal right ventricular size, thickness and systolic function (LVEF = 55%). There are no regional wall motion abnormalities. 3. Severely dilated left atrium (59 mm), normal right atrial size. 4. Moderately dilated pulmonary artery measuring 36 mm. 5. Moderate aortic and mitral regurgitation, mild tricuspid regurgitation. 6. Mild pericardial effusion. Collectively, these findings are consistent with end stage non-compaction cardiomyopathy.  Nocturnal oximetry 05/20/2019: SpO2 <88%:  53 minutes SpO2 <89%:  77 minutes Highest SPO2 99% and lowest SPO2 76%.   Oxygen desaturation events 225, desaturation index 25. Impression: Patient qualifies for nocturnal oxygen supplementation per Medicare guidelines Group 1.  Echocardiogram 10/09/2019: Severely depressed LV systolic function with visual EF 25-30%. Left ventricle cavity is severely dilated. Severe global hypokinesis.  Doppler evidence of grade II diastolic dysfunction, indeterminate LAP.  Left atrial cavity is severely dilated 70.34m/m2. Endocardial wire noted within the right cardiac chambers.  Mild to moderate aortic regurgitation. Moderate to severe mitral regurgitation. Moderate tricuspid regurgitation. Mild pulmonary hypertension. RVSP measures 48 mmHg. Mild pulmonic regurgitation. Moderate pericardial effusion. There is no hemodynamic significance. IVC is dilated with a respiratory response of >50%. Compared prior study 03/05/2019: LVEF has improved from <20% to 25-30%, Grade 3DD is now Grade 2 diastolic dysfunction, pulmonary hypertension per RVSP has improved,  otherwise no significant change.   Cardiopulmonary exercise test 11/05/2019: Exercise testing with gas exchange demonstrates normal functional capacity when compared to matched sedentary norms. There is a mild HF limitation with mildly elevated VE/VCO2 slope. There is likely an additional pulmonary limitation component as patient is ventilatory limited at peak  exercise and pre-exercise spirometry demonstrates mild restrictive lung physiology.   PCV ECHOCARDIOGRAM COMPLETE 03/30/2020 Severely depressed LV systolic function with visual EF <20%. Left ventricle cavity is severely dilated. Hypokinetic global wall motion. Doppler evidence of grade III (restrictive) diastolic dysfunction, elevated LAP. Calculated EF 31%. Endocardial wires noted within the right cardiac chambers. Left atrial cavity is severely dilated. Mild to moderate aortic regurgitation. Posterior directed jet, wraps the left atrial wall, severe (Grade III) mitral regurgitation. Moderate to severe tricuspid regurgitation. Moderate pulmonary hypertension. RVSP measures 52 mmHg. IVC is dilated with a respiratory response of >50%. Compared to prior study dated 10/09/2019: G2DD is  now G3DD, moderate to severe MR is now severe, mild PHTN 59mHg is now moderate.   EKG   04/15/2020: AV paced rhythm at a rate of 70 bpm with single PVC.  No further analysis.  03/18/2019: Probably AV paced rhythm with first-degree AV block, biventricular pacemaker detected.  No further analysis. Single PVC.   ICD   Schedule remote ICD transmission 04/03/2020: Longevity >7 years.  A paced 85%, BiV paced 91%.  Mode switches.  Ventricular sensing episodes reveal brief atrial tachycardia.  Impedance monitoring does not suggest fluid accumulation.  Assessment:     ICD-10-CM   1. Dyspnea on exertion  R06.00 Brain natriuretic peptide    Basic metabolic panel    Brain natriuretic peptide    Basic metabolic panel    Brain natriuretic peptide    Basic metabolic panel    Brain natriuretic peptide    Brain natriuretic peptide    Basic metabolic panel    2. Acute on chronic systolic heart failure (HCC)  IZ61.09Basic metabolic panel    Brain natriuretic peptide    Brain natriuretic peptide    Basic metabolic panel      Meds ordered this encounter  Medications   torsemide (DEMADEX) 20 MG tablet    Sig: TAKE  1 TABLET(20 MG) BY MOUTH DAILY    Dispense:  60 tablet    Refill:  3   Medications Discontinued During This Encounter  Medication Reason   finasteride (PROSCAR) 5 MG tablet Error   tamsulosin (FLOMAX) 0.4 MG CAPS capsule Error   torsemide (DEMADEX) 20 MG tablet Reorder     Recommendations:   Amaziah C Kister  is a 75y.[place] He has severe non ischemic dilated cardiomyopathy with severely depressed left-ventricular systolic function, Ejection fraction has been in teens for past 3 years in spite of optimal medical therapy. 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 Underwent BIV ICD CRT insertion on 02/28/2018 with Dr. KCaryl Comesfor nonischemic cardiomyopathy.   Patient has also been evaluated by Dr. BHaroldine Lawswho titrated patient's heart failure medications and started him on Farxiga.  Patient was also evaluated by Dr. 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  Patient presents for urgent visit at his request with concerns of worsening dyspnea, leg swelling, and fatigue since running out of torsemide about 2 weeks ago. Patient in acute on chronic systolic heart failure with fluid overload on exam today. Will restart torsemide. Patient will take torsemide 20 mg BID for 3 days and then 20 mg once daily from then on. Will obtain baseline BNP today and repeat BNP as well as BMP in 1 week.   Counseled patient regarding sings and symptoms that would warrant urgent/emergent evaluation. He verbalized understanding and agreement.   Will follow up in 1 month, sooner if needed, for HFrEF.    CAlethia Berthold PA-C 06/22/2020, 8:11 PM Office: [phone] Addendum:  Reviewed BMP which revealed elevated potassium and sodium. Will repeat BMP in 1 week.   BNP marked elevated, will trend.

## 2020-06-21 LAB — BASIC METABOLIC PANEL
BUN/Creatinine Ratio: 17 (ref 10–24)
BUN: 27 mg/dL (ref 8–27)
CO2: 22 mmol/L (ref 20–29)
Calcium: 9.3 mg/dL (ref 8.6–10.2)
Chloride: 110 mmol/L — ABNORMAL HIGH (ref 96–106)
Creatinine, Ser: 1.6 mg/dL — ABNORMAL HIGH (ref 0.76–1.27)
Glucose: 121 mg/dL — ABNORMAL HIGH (ref 65–99)
Potassium: 5.4 mmol/L — ABNORMAL HIGH (ref 3.5–5.2)
Sodium: 145 mmol/L — ABNORMAL HIGH (ref 134–144)
eGFR: 45 mL/min/{1.73_m2} — ABNORMAL LOW (ref >59)

## 2020-06-21 LAB — BRAIN NATRIURETIC PEPTIDE: BNP: 2191.9 pg/mL — ABNORMAL HIGH (ref 0.0–100.0)

## 2020-07-07 LAB — BASIC METABOLIC PANEL
BUN/Creatinine Ratio: 20 (ref 10–24)
BUN: 33 mg/dL — ABNORMAL HIGH (ref 8–27)
CO2: 26 mmol/L (ref 20–29)
Calcium: 9.2 mg/dL (ref 8.6–10.2)
Chloride: 101 mmol/L (ref 96–106)
Creatinine, Ser: 1.68 mg/dL — ABNORMAL HIGH (ref 0.76–1.27)
Glucose: 86 mg/dL (ref 65–99)
Potassium: 4.6 mmol/L (ref 3.5–5.2)
Sodium: 141 mmol/L (ref 134–144)
eGFR: 42 mL/min/{1.73_m2} — ABNORMAL LOW (ref >59)

## 2020-07-07 LAB — BRAIN NATRIURETIC PEPTIDE: BNP: 758.5 pg/mL — ABNORMAL HIGH (ref 0.0–100.0)

## 2020-07-07 NOTE — Progress Notes (Signed)
Inform patient BNP (measure of heart failure) improved significantly with diuretic.  Renal function and electrolytes have stayed stable, he may continue to take torsemide.

## 2020-07-08 NOTE — Progress Notes (Signed)
Called and spoke with patient regarding his BNP results. Patient stated that he is currently on Torsemide 2x day. Is that how you want him to continue? He wanted to make sure. Please advise.

## 2020-07-11 NOTE — Progress Notes (Signed)
I called patient back, he said that is what he has been doing and told me incorrectly. He is taking 1 tablet daily.

## 2020-07-11 NOTE — Progress Notes (Signed)
This is the patient you called about Friday. I went back and read my last  note, he should actually be taking torsemide once daily. Will you please reach out and confirm this with pt.

## 2020-07-14 ENCOUNTER — Encounter: Payer: Self-pay | Admitting: Cardiology

## 2020-07-14 ENCOUNTER — Other Ambulatory Visit: Payer: Self-pay

## 2020-07-14 ENCOUNTER — Ambulatory Visit: Payer: Medicare Other | Admitting: Cardiology

## 2020-07-14 VITALS — BP 100/64 | HR 67 | Temp 97.8°F | Resp 17 | Ht 72.0 in | Wt 168.0 lb

## 2020-07-14 DIAGNOSIS — N1831 Chronic kidney disease, stage 3a: Secondary | ICD-10-CM

## 2020-07-14 DIAGNOSIS — I5022 Chronic systolic (congestive) heart failure: Secondary | ICD-10-CM

## 2020-07-14 DIAGNOSIS — I428 Other cardiomyopathies: Secondary | ICD-10-CM

## 2020-07-14 DIAGNOSIS — Z95810 Presence of automatic (implantable) cardiac defibrillator: Secondary | ICD-10-CM

## 2020-07-14 MED ORDER — TORSEMIDE 20 MG PO TABS
20.0000 mg | ORAL_TABLET | ORAL | 3 refills | Status: DC
Start: 2020-07-14 — End: 2020-10-03

## 2020-07-14 NOTE — Progress Notes (Signed)
Primary Physician:  [name], MD   Patient ID: Miguel Patterson, male    DOB: [birth year], 75 y.o.   MRN: [id number]   Chief Complaint  Patient presents with   ACUTE HF   Chronic combined systolic and diastolic heart failure, NYHA   HPI    Miguel Patterson  is a 75 y.o. male  from [place]. He has severe non ischemic dilated cardiomyopathy with severely depressed left-ventricular systolic function, Ejection fraction has been in teens for past 3 years in spite of optimal medical therapy. 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 Underwent BIV ICD CRT insertion on 02/28/2018 with Dr. Caryl Comes for nonischemic cardiomyopathy.  In 06/2019, Dr. Caryl Comes changed BiV ICD settings from adaptive CRT to biventricular pacing.    Patient is also being followed by advanced heart failure team with Dr. Pierre Bali.  He has been evaluated by geneticist Dr. 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  He is presently doing well, since being on torsemide, has remained stable with very minimal dyspnea on exertion at times and has to take extra doses of torsemide and he has been very careful with monitoring his weights on a regular basis.  Past Medical History:  Diagnosis Date   Cardiomegaly    Chronic combined systolic and diastolic heart failure ([place]) 02/10/2018   DCM (dilated cardiomyopathy) ([place])    Encounter for adjustment of biventricular implantable cardioverter-defibrillator (ICD) 04/03/2019   Fatigue    ICD- Biventricular Medtronic ICD Claria Mri [id number] in situ 02/28/18 03/01/2018   LBBB (left bundle branch block)    Mild tricuspid regurgitation    Moderate aortic regurgitation    Moderate mitral regurgitation    Pericardial effusion    INSIGNIFICANT   S/P  biventricular cardiac pacemaker procedure 02/28/18 with ICD MDT  03/01/2018   Short of breath on exertion    Systolic heart failure (HCC)    Trace pulmonic regurgitation by prior echocardiogram     Past Surgical History:  Procedure Laterality Date   BIV ICD INSERTION CRT-D N/A 02/28/2018   Procedure: BIV ICD INSERTION CRT-D;  Surgeon: Deboraha Sprang, MD;  Location: [facility];  Service: Cardiovascular;  Laterality: N/A;   LEG SURGERY Left 2000   DUE TO BROKE LEG   Family History  Problem Relation Age of Onset   Hypertension Mother 52   Thyroid disease Mother    Healthy Father 32    Social History   Tobacco Use   Smoking status: Never   Smokeless tobacco: Never  Substance Use Topics   Alcohol use: No    Marital Status: Divorced  ROS:   Review of Systems  Cardiovascular:  Positive for dyspnea on exertion. Negative for chest pain, leg swelling, orthopnea and paroxysmal nocturnal dyspnea.  Gastrointestinal:  Negative for melena.  Objective:  Blood pressure 100/64, pulse 67, temperature 97.8 F (36.6 C), temperature source Temporal, resp. rate 17, height 6' (1.829 m), weight 168 lb (76.2 kg), SpO2 97 %. Body mass index is 22.78 kg/m.  Vitals with BMI 07/14/2020 06/20/2020 05/26/2020  Height '6\' 0"'$  '6\' 0"'$  '6\' 0"'$   Weight 168 lbs 178 lbs 171 lbs  BMI 22.78 [id number]  Systolic [id number] 0000000 [id number]  Diastolic 64 74 67  Pulse 67 66 70     Physical Exam Vitals  reviewed.  Constitutional:      Appearance: He is well-developed.  HENT:     Head: Normocephalic and atraumatic.  Neck:     Vascular: No carotid bruit or JVD.  Cardiovascular:     Rate and Rhythm: Normal rate and regular rhythm.     Pulses: Normal pulses and intact distal pulses.     Heart sounds: S1 normal and S2 normal. Murmur heard.  High-pitched blowing holosystolic murmur is present with a grade of 3/6 at the apex.    No gallop.  Pulmonary:     Effort: Pulmonary effort is normal. No accessory muscle usage or  respiratory distress.     Breath sounds: Rales (bilateral bases) present. No wheezing or rhonchi.  Abdominal:     General: There is distension.  Musculoskeletal:     Right lower leg: Edema (1+ pitting) present.     Left lower leg: Edema (1+ pitting, right worse than left) present.  Neurological:     Mental Status: He is alert.   Laboratory examination:   CMP Latest Ref Rng & Units 07/06/2020 06/20/2020 05/12/2020  Glucose 65 - 99 mg/dL 86 121(H) 100(H)  BUN 8 - 27 mg/dL 33(H) 27 31(H)  Creatinine 0.76 - 1.27 mg/dL 1.68(H) 1.60(H) 1.71(H)  Sodium 134 - 144 mmol/L 141 145(H) 143  Potassium 3.5 - 5.2 mmol/L 4.6 5.4(H) 4.9  Chloride 96 - 106 mmol/L 101 110(H) 99  CO2 20 - 29 mmol/L '26 22 24  '$ Calcium 8.6 - 10.2 mg/dL 9.2 9.3 9.3  Total Protein 6.5 - 8.1 g/dL - - -  Total Bilirubin 0.3 - 1.2 mg/dL - - -  Alkaline Phos 38 - 126 U/L - - -  AST 15 - 41 U/L - - -  ALT 0 - 44 U/L - - -   CBC Latest Ref Rng & Units 02/10/2020 02/25/2018  WBC 4.0 - 10.5 K/uL 4.5 5.2  Hemoglobin 13.0 - 17.0 g/dL 12.9(L) 14.1  Hematocrit 39.0 - 52.0 % 40.7 43.3  Platelets 150 - 400 K/uL 135(L) 137(L)   Lipid Panel     Component Value Date/Time   CHOL 179 03/20/2019 0804   TRIG 61 03/20/2019 0804   HDL 51 03/20/2019 0804   LDLCALC 116 (H) 03/20/2019 0804   HEMOGLOBIN A1C No results found for: HGBA1C, MPG TSH No results for input(s): TSH in the last 8760 hours.  Current Outpatient Medications on File Prior to Visit  Medication Sig Dispense Refill   carvedilol (COREG) 3.125 MG tablet TAKE 1 TABLET BY MOUTH TWICE DAILY WITH MEALS 180 tablet 1   dapagliflozin propanediol (FARXIGA) 10 MG TABS tablet Take 1 tablet (10 mg total) by mouth daily before breakfast. 30 tablet 3   losartan (COZAAR) 25 MG tablet Take 1 tablet (25 mg total) by mouth every other day. 90 tablet 1   magnesium oxide (MAG-OX) 400 MG tablet Take 400 mg by mouth 2 (two) times daily.     triamcinolone ointment (KENALOG) 0.1 % Apply 2  application topically as needed.     No current facility-administered medications on file prior to visit.    BNP (last 3 results) Recent Labs    02/10/20 1055 06/20/20 1431 07/06/20 0816  BNP 816.6* 2,191.9* 758.5*   Radiology:   No results found.  Cardiac Studies:   Exercise sestamibi stress test 08/27/2014: 1. The resting electrocardiogram demonstrated normal sinus rhythm, incomplete LBBB and no resting arrhythmias.  ST segment depression and T-wave inversion in inferior and lateral leads. Stress EKG  is negative for ischemia. The patient performed treadmill exercise using a Bruce protocol, completing 6:26 minutes. Thre were occasional PVCs. The patient completed an estimated workload of 7.65 METS, 95% of the maximum predicted heart rate. The stress test was terminated because of dyspnea. 2. The LV is markedly dilated both at rest and stress images. The LV end diastolic volume was [id number]. Thre is anterior wall thinning. There is no evidence of ischemia. Thre is severe generalized hypokinesis and LVEF markedly depressed at 11%.  Findings are consistent with dilated cardiomyopathy.  Clinical correlation is recommended.   Cardiac MRI 11/20/2017: 1. Severely dilated left ventricle with normal wall thickness and severely decreased systolic function (LVEF = 14%). There is severe diffuse hypokinesis and paradoxical septal motion consistent with LBBB. The ratio of non-compacted to compacted myocardium in the apical and mid portions of the ventricle is > 4:1. There is midwall late gadolinium enhancement in the basal anteroseptal and inferoseptal myocardium. 2. Normal right ventricular size, thickness and systolic function (LVEF = 55%). There are no regional wall motion abnormalities. 3. Severely dilated left atrium (59 mm), normal right atrial size. 4. Moderately dilated pulmonary artery measuring 36 mm. 5. Moderate aortic and mitral regurgitation, mild tricuspid regurgitation. 6. Mild pericardial  effusion. Collectively, these findings are consistent with end stage non-compaction cardiomyopathy.  Nocturnal oximetry 05/20/2019: SpO2 <88%:  53 minutes SpO2 <89%:  77 minutes Highest SPO2 99% and lowest SPO2 76%.   Oxygen desaturation events 225, desaturation index 25. Impression: Patient qualifies for nocturnal oxygen supplementation per Medicare guidelines Group 1.  Cardiopulmonary exercise test 11/05/2019: Exercise testing with gas exchange demonstrates normal functional capacity when compared to matched sedentary norms. There is a mild HF limitation with mildly elevated VE/VCO2 slope. There is likely an additional pulmonary limitation component as patient is ventilatory limited at peak exercise and pre-exercise spirometry demonstrates mild restrictive lung physiology.   PCV ECHOCARDIOGRAM COMPLETE 03/30/2020 Severely depressed LV systolic function with visual EF <20%. Left ventricle cavity is severely dilated. Hypokinetic global wall motion. Doppler evidence of grade III (restrictive) diastolic dysfunction, elevated LAP. Calculated EF 31%. Endocardial wires noted within the right cardiac chambers. Left atrial cavity is severely dilated. Mild to moderate aortic regurgitation. Posterior directed jet, wraps the left atrial wall, severe (Grade III) mitral regurgitation. Moderate to severe tricuspid regurgitation. Moderate pulmonary hypertension. RVSP measures 52 mmHg. IVC is dilated with a respiratory response of >50%. Compared to prior study dated 10/09/2019: G2DD is now G3DD, moderate to severe MR is now severe, mild PHTN 72mHg is now moderate.   EKG   04/15/2020: AV paced rhythm at a rate of 70 bpm with single PVC.  No further analysis.  03/18/2019: Probably AV paced rhythm with first-degree AV block, biventricular pacemaker detected.  No further analysis. Single PVC.   ICD   Remote biventricular ICD transmission 07/03/2020: No high atrial rate episodes. 11 VHR episodes, brief  NSVT max 6 sec.  Longevity 6.6 years.  Lead impedance and thresholds within normal limits. AP 85%, BP 94%. Thoracic impedance at baseline.  Assessment:     ICD-10-CM   1. Left ventricular non-compaction cardiomyopathy (HCC)  I42.8     2. ICD- Biventricular Medtronic ICD Claria Mri [id number] in situ 02/28/18  Z95.810     3. Chronic systolic heart failure (HCC)  I50.22 torsemide (DEMADEX) 20 MG tablet    4. Stage 3a chronic kidney disease (HCC)  N18.31       Meds ordered this encounter  Medications   torsemide (DEMADEX)  20 MG tablet    Sig: Take 1 tablet (20 mg total) by mouth as directed. TAKE 1 to 2 TABLET(20 MG) BY MOUTH DAILY as directed    Dispense:  120 tablet    Refill:  3    Medications Discontinued During This Encounter  Medication Reason   torsemide (DEMADEX) 20 MG tablet       Recommendations:   Miguel Patterson  is a 75 y.o. from [place]. He has severe non ischemic dilated cardiomyopathy with severely depressed left-ventricular systolic function, Ejection fraction has been in teens for past 3 years in spite of optimal medical therapy. 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 Underwent BIV ICD CRT insertion on 02/28/2018 with Dr. Caryl Comes for nonischemic cardiomyopathy.   Patient has also been evaluated by Dr. Haroldine Laws who titrated patient's heart failure medications and started him on Farxiga.  Patient was also evaluated by Dr. 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  Patient is here on a 13-monthoffice visit, is presently doing well.  He has baseline BNP is between 6-800, today he is not in any acute decompensated heart failure.  I have again discussed with him regarding the values with a BNP.  His renal function has remained stable  at stage IIIa chronic kidney disease.  He is aware how to take torsemide, he has responded well to the same.  He has been unable to tolerate Entresto due to hyperkalemia and also severe hypotension.  Hence only on Farxiga and carvedilol low-dose.  He feels the best he has in quite a while.  I did not make any changes to his medications.  He has an appointment to see Dr. BHaroldine Lawssometime in August 2022.  I will see him back in 6 months.  I will continue to follow his ICD remotely, recent evaluation a month ago revealed normal function and no fluid overload state.     Miguel Prows MD, FAmbulatory Surgery Center Of Tucson Inc7/14/2022, 3:42 PM Office: [phone]: [phone]: [phone]

## 2020-07-18 ENCOUNTER — Other Ambulatory Visit: Payer: Self-pay

## 2020-07-18 MED ORDER — CARVEDILOL 3.125 MG PO TABS: 3.1250 mg | ORAL_TABLET | Freq: Two times a day (BID) | ORAL | 1 refills | Status: DC

## 2020-08-29 ENCOUNTER — Encounter: Payer: Self-pay | Admitting: Cardiology

## 2020-08-29 ENCOUNTER — Ambulatory Visit: Payer: Medicare Other | Admitting: Cardiology

## 2020-08-29 ENCOUNTER — Telehealth: Payer: Self-pay

## 2020-08-29 ENCOUNTER — Other Ambulatory Visit: Payer: Self-pay | Admitting: Cardiology

## 2020-10-03 ENCOUNTER — Ambulatory Visit (HOSPITAL_COMMUNITY)
Admission: RE | Admit: 2020-10-03 | Discharge: 2020-10-03 | Disposition: A | Discharge status: Home / Self Care | Payer: Medicare Other | Source: Ambulatory Visit | Attending: Internal Medicine | Admitting: Internal Medicine

## 2020-10-03 ENCOUNTER — Encounter (HOSPITAL_COMMUNITY): Payer: Self-pay | Admitting: Internal Medicine

## 2020-10-03 ENCOUNTER — Other Ambulatory Visit: Payer: Self-pay

## 2020-10-03 VITALS — BP 110/70 | HR 76 | Wt 170.2 lb

## 2020-10-03 DIAGNOSIS — N1831 Chronic kidney disease, stage 3a: Secondary | ICD-10-CM

## 2020-10-03 DIAGNOSIS — I428 Other cardiomyopathies: Secondary | ICD-10-CM

## 2020-10-03 DIAGNOSIS — Z886 Allergy status to analgesic agent status: Secondary | ICD-10-CM | POA: Diagnosis not present

## 2020-10-03 DIAGNOSIS — I5022 Chronic systolic (congestive) heart failure: Secondary | ICD-10-CM | POA: Diagnosis not present

## 2020-10-03 DIAGNOSIS — I5042 Chronic combined systolic (congestive) and diastolic (congestive) heart failure: Secondary | ICD-10-CM | POA: Insufficient documentation

## 2020-10-03 DIAGNOSIS — Z79899 Other long term (current) drug therapy: Secondary | ICD-10-CM | POA: Insufficient documentation

## 2020-10-03 DIAGNOSIS — I447 Left bundle-branch block, unspecified: Secondary | ICD-10-CM

## 2020-10-03 DIAGNOSIS — Z950 Presence of cardiac pacemaker: Secondary | ICD-10-CM | POA: Insufficient documentation

## 2020-10-03 DIAGNOSIS — Z8249 Family history of ischemic heart disease and other diseases of the circulatory system: Secondary | ICD-10-CM | POA: Diagnosis not present

## 2020-10-03 DIAGNOSIS — N1832 Chronic kidney disease, stage 3b: Secondary | ICD-10-CM | POA: Diagnosis not present

## 2020-10-03 LAB — BASIC METABOLIC PANEL
Anion gap: 9 (ref 5–15)
BUN: 34 mg/dL — ABNORMAL HIGH (ref 8–23)
CO2: 28 mmol/L (ref 22–32)
Calcium: 8.9 mg/dL (ref 8.9–10.3)
Chloride: 103 mmol/L (ref 98–111)
Creatinine, Ser: 1.66 mg/dL — ABNORMAL HIGH (ref 0.61–1.24)
GFR, Estimated: 43 mL/min — ABNORMAL LOW (ref >60)
Glucose, Bld: 79 mg/dL (ref 70–99)
Potassium: 3.7 mmol/L (ref 3.5–5.1)
Sodium: 140 mmol/L (ref 135–145)

## 2020-10-03 LAB — BRAIN NATRIURETIC PEPTIDE: B Natriuretic Peptide: 1705.4 pg/mL — ABNORMAL HIGH (ref 0.0–100.0)

## 2020-10-03 MED ORDER — TORSEMIDE 20 MG PO TABS: 20.0000 mg | ORAL_TABLET | Freq: Two times a day (BID) | ORAL | 3 refills | Status: DC

## 2020-10-03 NOTE — Progress Notes (Signed)
ADVANCED HF CLINIC CONSULT NOTE  Referring Physician: Dr. Einar Gip  Primary Care: [name], MD Primary Cardiologist: Adrian Prows EP: Caryl Comes HF: Dr. Haroldine Laws  HPI:  Miguel Patterson  is a 75 y.o. male from [place] with chronic systolic HF and [id number] (Creatinine 1.5-1.9) referred by Dr. Einar Gip to discuss candidacy for advanced HF therapies.   [name] has a fairly limited PMHx. No previous h/o HTN or known CAD.   He first developed HF symptoms in 2016 and saw his PCP who referred him to Dr. Woody Seller and then Dr. Einar Gip.   He was referred to Dr. Caryl Comes in 2019 for ICD consideration. According to Dr. Olin Pia note from 10/14/17 initial echo in 2016 had EF 11% with anterior thinning. There were also frequent PVCs (~25% by ECG). At that time he did not have LBBB (IVCD). The question was raised about the possibility of [place] disease or a genetic component. (he is 1 of 17 children and several family members on heart medications). He was referred for a cMRI.    Echo in 8/19 read as EF < 15% mod AI/MR (I do not have images to review).   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 Underwent BIV ICD CRT insertion on 02/28/2018 with Dr. Caryl Comes for nonischemic cardiomyopathy. In 06/02/2019, he did change his BiV ICD settings from adaptive CRT to turning on biventricular pacing. He does not recall ever having a cardiac cath.  We saw him in 11/21 and referred for genetic evaluation for LVNC. He was evaluated by Dr. 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  Overall he has done very well. He saw Dr. Einar Gip and losartan moved to bedtime due to low BP.  Repeat echo 10/21 EF 25-30%  Last  seen in HF clinic in 02/22. Doing well at that time.   Losartan reduced to every other day by his regular cardiologist in [place] d/t hypotension. It was later discontinued in August d/t angioedema. Also trialed spiro qod but had bump in Scr so stopped.  Did have a/c systolic HF over the summer after he had run out of diuretics for a couple of weeks.  He is here today for routine f/u. Continues to walk 4 miles a day without significant limitation. Every 4-5 days he may feel more short of breath but is able to complete his regular walk. No orthopnea, PND or leg edema. No dizziness.    ICD interrogation: Unable to obtain. Device website down  Echo 03/2020: LVEF < 20%, severe MR, moderate to severe TR, RVSP 52 mmhg  CPX 11/21  FVC 3.17 (71%)      FEV1 2.33 (70%)        FEV1/FVC 74 (97%)        MVV 94 (72%)        BP rest: 86/58 Standing BP: 92/60 BP peak: 122/60   Peak VO2: 26.9 (103% predicted peak VO2)  VE/VCO2 slope:  35  OUES: 2.17  Peak RER: 1.11   Ventilatory Threshold: 15.4 (59% predicted or measured peak VO2)   VE/MVV:  93%  O2pulse:  14   (100% predicted O2pulse)     Past Medical History:  Diagnosis Date   Cardiomegaly    Chronic  combined systolic and diastolic heart failure ([place]) 02/10/2018   DCM (dilated cardiomyopathy) ([place])    Encounter for adjustment of biventricular implantable cardioverter-defibrillator (ICD) 04/03/2019   Fatigue    ICD- Biventricular Medtronic ICD Claria Mri [id number] in situ 02/28/18 03/01/2018   LBBB (left bundle branch block)    Mild tricuspid regurgitation    Moderate aortic regurgitation    Moderate mitral regurgitation    Pericardial effusion    INSIGNIFICANT   S/P biventricular cardiac pacemaker procedure 02/28/18 with ICD MDT  03/01/2018   Short of breath on exertion    Systolic heart failure (HCC)    Trace pulmonic regurgitation by prior echocardiogram     Past Medical History:  Diagnosis Date   Cardiomegaly    Chronic combined  systolic and diastolic heart failure ([place]) 02/10/2018   DCM (dilated cardiomyopathy) ([place])    Encounter for adjustment of biventricular implantable cardioverter-defibrillator (ICD) 04/03/2019   Fatigue    ICD- Biventricular Medtronic ICD Claria Mri [id number] in situ 02/28/18 03/01/2018   LBBB (left bundle branch block)    Mild tricuspid regurgitation    Moderate aortic regurgitation    Moderate mitral regurgitation    Pericardial effusion    INSIGNIFICANT   S/P biventricular cardiac pacemaker procedure 02/28/18 with ICD MDT  03/01/2018   Short of breath on exertion    Systolic heart failure (HCC)    Trace pulmonic regurgitation by prior echocardiogram     Current Outpatient Medications  Medication Sig Dispense Refill   carvedilol (COREG) 3.125 MG tablet Take 1 tablet (3.125 mg total) by mouth 2 (two) times daily with a meal. 180 tablet 1   dapagliflozin propanediol (FARXIGA) 10 MG TABS tablet Take 1 tablet (10 mg total) by mouth daily before breakfast. 30 tablet 3   magnesium oxide (MAG-OX) 400 MG tablet Take 400 mg by mouth 2 (two) times daily.     torsemide (DEMADEX) 20 MG tablet Take 1 tablet (20 mg total) by mouth as directed. TAKE 1 to 2 TABLET(20 MG) BY MOUTH DAILY as directed 120 tablet 3   No current facility-administered medications for this encounter.    Allergies  Allergen Reactions   Losartan Potassium Anaphylaxis    angioedema   Aspirin Other (See Comments)    Stomach pain       Social History   Socioeconomic History   Marital status: Divorced    Spouse name: Not on file   Number of children: 3   Years of education: Not on file   Highest education level: Not on file  Occupational History   Not on file  Tobacco Use   Smoking status: Never   Smokeless tobacco: Never  Vaping Use   Vaping Use: Never used  Substance and Sexual Activity   Alcohol use: No   Drug use: No   Sexual activity: Not on file  Other Topics Concern   Not on file  Social History Narrative    Not on file   Social Determinants of Health   Financial Resource Strain: Not on file  Food Insecurity: Not on file  Transportation Needs: Not on file  Physical Activity: Not on file  Stress: Not on file  Social Connections: Not on file  Intimate Partner Violence: Not on file      Family History  Problem Relation Age of Onset   Hypertension Mother 66   Thyroid disease Mother    Healthy Father 27    Vitals:   10/03/20 1014  BP: 110/70  Pulse:  76  SpO2: 97%  Weight: 77.2 kg (170 lb 3.2 oz)    PHYSICAL EXAM: General:  Appears younger than stated age. No resp difficulty HEENT: normal Neck: supple. JVP ~7-8. Carotids 2+ bilat; no bruits.  Cor: PMI nondisplaced. Regular rhythm with occasional ectopy. No rubs, gallops or murmurs. Lungs: CTA Abdomen: soft, nontender, nondistended. No hepatosplenomegaly.  Extremities: no cyanosis, clubbing, rash. 1+ RLE edema, varicose veins noted on R. Neuro: alert & orientedx3, cranial nerves grossly intact. moves all 4 extremities w/o difficulty. Affect pleasant  ECG: AV sequential pacing with PVC  ASSESSMENT & PLAN:  1. 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  - etiology remains unclear. 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 He also has a h/o LBBB but his CM predated the development of LBBB so doubt it can be implicated. Previously also has had frequent PVCs but not present today - Echo 10/21 EF 25-30% with Dr. Einar Gip  - Echo 03/22 EF < 20% with Dr. Einar Gip - s/p MDT CRT-D - currently NYHA I. Some days has dyspnea but able to walk 4 miles without limitation - CPX 11/21 looks good. pVO2 26.9 - ReDS 36%. Slight volume up. On 20 mg Torsemide BID. Recommended he take an extra 20 mg every M, W, F.  -  BMET and BNP today, BMET again in 2 weeks. Anticipate may need to add K supplement. - Off losartan due to angioedema - continue carvedilol 3.125 bid - Continue Farxiga 10  - Worsening Scr when spiro added several months ago. Could consider retrial in future but need to use caution. - We discussed the role of advanced therapies (translant and VAD). Given age he does not qualify for transplant (cutoff 75 y/o) and functional capacity also too good for VAD or transplant consideration at this time. Recommended he call before next f/u if he notices any worsening dyspnea or decline in activity tolerance. - Genetic eval complete as above  2. Valvular heart disease - Mod AI/Mod MR on cMRI - Severe MR, mild to moderate AI and moderate to severe TR on echo 03/22 - likely functional.   3. [id number] - Continue Farxiga - BMET today   Follow-up: 4 months, sooner if needed as discussed above   FINCH, LINDSAY N, PA-C  10:18 AM  Patient seen and examined with the above-signed Advanced Practice Provider and/or Housestaff. I personally reviewed laboratory data, imaging studies and relevant notes. I independently examined the patient and formulated the important aspects of the plan. I have edited the note to reflect any of my changes or salient points. I have personally discussed the plan with the patient and/or family.  He has advanced non-compaction cardiomyopathy with EF <20 and severe MR, mod-sev TR. However he is doing surprisingly well. CPX with normal functional capacity. Has periods of increased SOB.   General:  Well appearing. No resp difficulty HEENT: normal Neck: supple. JVP 7 Carotids 2+ bilat; no bruits. No lymphadenopathy or thryomegaly appreciated. Cor: PMI nondisplaced. Regular rate & rhythm. 2/6 MR Lungs: clear Abdomen: soft, nontender, nondistended. No hepatosplenomegaly. No bruits or masses. Good bowel sounds. Extremities: no cyanosis, clubbing, rash, 1+ edema + varicose veins  Neuro:  alert & orientedx3, cranial nerves grossly intact. moves all 4 extremities w/o difficulty. Affect pleasant  He is doing suprisingly well for his degree of CM. Volume status mildly elevated. Will increase torsemide to 40/20 on M/W/F (leave at 20 bid other days). Unable to tolerate losartan due to angio-edema. Will recheck labs if K < 4.5 can recosndier spiro.   Continue to follow closely for VAD.   Glori Bickers, MD  11:41 AM

## 2020-10-03 NOTE — Patient Instructions (Signed)
Increase Torsemide to 20 mg Twice daily with an extra 20 mg every Monday, Wednesday, and Friday  Labs done today, your results will be available in MyChart, we will contact you for abnormal readings.  Your physician recommends that you return for lab work in: 10 days  Your physician recommends that you schedule a follow-up appointment in: 4 months  If you have any questions or concerns before your next appointment please send [place] a message through [place] or call our office at [phone].    TO LEAVE A MESSAGE FOR THE NURSE SELECT OPTION 2, PLEASE LEAVE A MESSAGE INCLUDING: YOUR NAME DATE OF BIRTH CALL BACK NUMBER REASON FOR CALL**this is important as we prioritize the call backs  YOU WILL RECEIVE A CALL BACK THE SAME DAY AS LONG AS YOU CALL BEFORE 4:00 PM  At the [facility], you and your health needs are our priority. As part of our continuing mission to provide you with exceptional heart care, we have created designated Provider Care Teams. These Care Teams include your primary Cardiologist (physician) and Advanced Practice Providers (APPs- Physician Assistants and Nurse Practitioners) who all work together to provide you with the care you need, when you need it.   You may see any of the following providers on your designated Care Team at your next follow up: Dr Glori Bickers Dr Loralie Champagne Dr Patrice Paradise, NP Lyda Jester, [place] Ginnie Smart Audry Riles, PharmD   Please be sure to bring in all your medications bottles to every appointment.

## 2020-10-03 NOTE — Progress Notes (Signed)
ReDS Vest / Clip - 10/03/20 1100       ReDS Vest / Clip   Station Marker C    Ruler Value 28    ReDS Value Range Moderate volume overload    ReDS Actual Value 36

## 2020-10-05 ENCOUNTER — Other Ambulatory Visit: Payer: Self-pay

## 2020-10-05 ENCOUNTER — Ambulatory Visit: Payer: Medicare Other | Admitting: Neurology

## 2020-10-05 VITALS — BP 96/60 | HR 68 | Ht 72.0 in | Wt 165.0 lb

## 2020-10-05 DIAGNOSIS — I5042 Chronic combined systolic (congestive) and diastolic (congestive) heart failure: Secondary | ICD-10-CM

## 2020-10-05 DIAGNOSIS — Z95810 Presence of automatic (implantable) cardiac defibrillator: Secondary | ICD-10-CM | POA: Diagnosis not present

## 2020-10-05 DIAGNOSIS — Z7189 Other specified counseling: Secondary | ICD-10-CM

## 2020-10-05 NOTE — Progress Notes (Signed)
SLEEP MEDICINE CLINIC    Provider:  Larey Seat, MD  Primary Care Physician:  [name], [place] [address]     Referring Provider: Dr Einar Gip, MD   He is followed by Dr. Einar Gip as a general cardiologist by Dr. Haroldine Laws follows in the heart failure clinic.       Chief Complaint according to patient   Patient presents with:     New Patient (Initial Visit)     pt alone, rm 10. ASV machine visit.       INTERVAL HISTORY : Miguel Patterson is a 75 year- old [place] Caucasian male patient following up after 3 sleep studies, using ASV, 5 hours average use. In September he had dental pain preventing him from using the machine, the mask triggered pain.  Miguel Patterson, reports that he is now able to walk fast-paced several miles a day, he has kept his weight, he is not oxygen dependent, he is using the ASV again more frequently now after he had a break related to facial and dental pain.   Looking back at the months of August he used the machine 23 out of 30 days the equivalent of about 70% of days, compliance for hourly use however was only about 60%.  The average user time on days used is 5 hours and 40 minutes and I would like for him to build that back up to 6 hours or more his ASV remains set at an expiratory pressure support of 10 cmH2O with a minimum pressure of 4 and a maximum pressure support of 15 cmH2O, his residual AHI is 3.2 which is a great resolution.  And he does not spent any significant time and Cheyne-Stokes respiration air leak is high at the 95th percentile at 38.9 L a minute but it does not wake him and therefore I will not interfere.  His Epworth sleepiness score is endorsed at one-point fatigue severity at 11 point and he did not endorse the geriatric depression scale.    He is followed by Dr. Einar Gip as a general cardiologist by Dr. Haroldine Laws follows in the heart failure clinic. Miguel Patterson  is a 75 y.o. male from [place] with  chronic systolic HF and [id number] (Creatinine 1.5-1.9) referred by Dr. Einar Gip to discuss candidacy for advanced HF therapies.    Miguel Patterson has a fairly limited PMHx. No previous h/o HTN or known CAD.    He first developed HF symptoms in 2016 and saw his PCP who referred him to Dr. Woody Seller and then Dr. Einar Gip.    He was referred to Dr. Caryl Comes in 2019 for ICD consideration. According to Dr. Olin Pia note from 10/14/17 initial echo in 2016 had EF 11% with anterior thinning. There were also frequent PVCs (~25% by ECG). At that time he did not have LBBB (IVCD). The question was raised about the possibility of Chagas disease or a genetic component. (he is 1 of 17 children and several family members on heart medications). He was referred for a cMRI.     Echo in 8/19 read as EF < 15% mod AI/MR (I do not have images to review).    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 Underwent BIV ICD CRT insertion on 02/28/2018 with Dr. Caryl Comes for nonischemic cardiomyopathy. In 06/02/2019, he did change his BiV ICD settings from adaptive CRT  to turning on biventricular pacing. He does not recall ever having a cardiac cath.   We saw him in 11/21 and referred for genetic evaluation for LVNC. He was evaluated by Dr. 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   Overall he has done very well. He saw Dr. Einar Gip and losartan moved to bedtime due to low BP.  Repeat echo 10/21 EF 25-30%   Last seen in HF clinic in 02/22. Doing well at that time.  .     Miguel Patterson is a 75  Year- old [place] Caucasian male patient following up after 3 sleep studies. [name]  was first evaluated by a  polysomnography given that I ordered and a split-night study.  He had a history of combined systolic and diastolic heart failure sleep-related hypoxia and a biventricular implantable cardioverter defibrillator in situ.  He was referred by Dr. Jamal Collin his cardiologist and initially on 06-18-19 and then underwent a sleep study on 6-20 921.  His sleep architecture was fairly normal he had 94 central apneas 56 mixed apneas and only 11 additional hypopneas or shallow breathing spells this placed him in severe sleep apnea territory but because of the central nature his apneas were clustering in non-REM sleep and not in dream sleep as obstructive apneas 2.  But he slept supine his apnea index was still very high at 62.1/h he did not show for conization or vocalizations he did have severe hypoxemia his time at night spent below 89% oxygen saturation was 75 minutes with the lowest at 81% nadir.  Based on this and the cyclic breathing nature that I associated with Cheyne-Stokes breathing I asked the patient to return and this took place on 08-10-19 peripheral for the muscles when worse first tried on a CPAP which she failed when on the BiPAP which was initiated under a full facemask starting at 9/5 cmH2O added with ST 10 backup rate but the AHI still was in the 2 digits at the highest 55.4.   The technologist changed to adapt SV as instructed and he reached a apnea index of 0/h for sleep.  Of 92 minutes with a setting of 16 over 10/4.  This was a prescription that was finally issued with the use of a F 30 I mask which spares the bridge of the nose 9 he has a very narrow bridge) .  ASV: His ASV use has been 100% compliant with an average of 7 hours 18 minutes the expiratory pressure is still set at 10 cmH2O the minimum pressure of 4 at the maximum pressure support is 15 and his residual AHI was 2.1 over the last 30 days on average he has 1 apnea every 5 hours of sleep.  I consider this a very great success.  He does have  some air leakage which happens whenever a patient uses nasal pillows or and under the nose facial mask however this is probably still the most comfortable setting for sleep.   He endorsed the Epworth sleepiness score today at 2 out of 24 points and the fatigue severity at 9 out of 63 possible points.      He was seen here in consultation on 06-18-2019 from Einar Gip, MD.   Chief concern according to patient :   Miguel Patterson is seen on 18 June 2019  as a new patient to the sleep clinic.  He received a implanted defibrillator in the year 2020, has followed for electrophysiology with Dr. Adam Phenix, his general cardiologist is Dr. Ardeth Perfect at this point.  Dr. Einar Gip is concerned that there may be sleep apnea or sleep hypoxemia conditions present that would explain the patient's condition.  He is waking up frequently and reports fragmented sleep.  He is currently on tamsulosin, carvedilol, magnesium and losartan.  Has never smoked, does not drink alcohol has no recreational drug use or caffeine use.    He  has a past medical history of Cardiomegaly, Chronic combined systolic and diastolic heart failure ([place]) (02/10/2018), DCM (dilated cardiomyopathy) ([place]), Encounter for adjustment of biventricular implantable cardioverter-defibrillator (ICD) (04/03/2019), Fatigue, ICD- Biventricular Medtronic ICD Claria Mri [id number] in situ 02/28/18 (03/01/2018), LBBB (left bundle branch block), Mild tricuspid regurgitation, Moderate aortic regurgitation, Moderate mitral regurgitation, Pericardial effusion, S/P biventricular cardiac pacemaker procedure 02/28/18 with ICD MDT  (03/01/2018), Short of breath on exertion, Systolic heart failure ([place]), and Trace pulmonic regurgitation by prior echocardiogram.    Sleep relevant medical history: Nocturia 4-7 times , every hour. No snoring, no ENT surgery.    Family medical /sleep history:no other family member on CPAP with OSA, insomnia, sleep walkers.    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  He lives in a household with alone-  He is divorced . His daughter , son in-law and children left his home 2019.  Pets are not present. No Tobacco use, ETOH use , no Caffeine intake. Regular exercise in form of walking  4 miles a day. Daily calisthenics.   Hobbies :reading   Sleep habits are as follows: The patient's dinner time is between 2-3 PM. The patient goes to bed at 8.30-9.30 PM and is promptly asleep- he continues to sleep for 1-2 hours, wakes for many, many bathroom breaks.   The preferred sleep position is supine, with the support of 1 pillow.  Dreams are reportedly less frequent  5.30  AM is the usual rise time.The patient wakes up spontaneously.  He reports  feeling refreshed - restored in AM,no  Naps are taken.  Cardiac MRI 11/20/2017: 1. Severely dilated left ventricle with normal wall thickness and severely decreased systolic function (LVEF = 14%). There is severe diffuse hypokinesis and paradoxical septal motion consistent with LBBB. The ratio of non-compacted to compacted myocardium in the apical and mid portions of the ventricle is > 4:1. There is midwall late gadolinium enhancement in the basal anteroseptal and inferoseptal myocardium. 2. Normal right ventricular size, thickness and systolic function (LVEF = 55%). There are no regional wall motion abnormalities. 3. Severely dilated left atrium (59 mm), normal right atrial size. 4. Moderately dilated pulmonary artery measuring 36 mm. 5. Moderate aortic and mitral regurgitation, mild tricuspid regurgitation. 6. Mild pericardial effusion. Collectively, these findings are consistent with end stage non-compaction cardiomyopathy.   Nocturnal oximetry 05/20/2019: SpO2 <88%:  53 minutes SpO2 <89%:  77 minutes Highest SPO2 99% and lowest SPO2 76%.   Oxygen desaturation events 225, desaturation index 25. Impression: Patient qualifies for nocturnal oxygen supplementation per Medicare guidelines Group  1.   Echocardiogram 10/09/2019: Severely depressed LV systolic function with visual EF 25-30%. Left ventricle cavity is severely dilated. Severe global hypokinesis.  Doppler evidence of grade II diastolic dysfunction, indeterminate LAP.  Left atrial cavity is severely dilated 70.9m/m2. Endocardial wire noted within the right cardiac chambers.  Mild to moderate aortic regurgitation.  Moderate to severe mitral regurgitation. Moderate tricuspid regurgitation. Mild pulmonary hypertension. RVSP measures 48 mmHg. Mild pulmonic regurgitation. Moderate pericardial effusion. There is no hemodynamic significance. IVC is dilated with a respiratory response of >50%. Compared prior study 03/05/2019: LVEF has improved from <20% to 25-30%, Grade 3DD is now Grade 2 diastolic dysfunction, pulmonary hypertension per RVSP has improved,  otherwise no significant change.      EKG:   03/18/1019: Probably AV paced rhythm with first-degree AV block, biventricular pacemaker detected.  No further analysis. Single PVC.  Review of Systems: Out of a complete 14 system review, the patient complains of only the following symptoms, and all other reviewed systems are negative.:  Fatigue, sleepiness , snoring,   Nocturia- fragmented sleep,no  Insomnia , no RLS.    How likely are you to doze in the following situations: 0 = not likely, 1 = slight chance, 2 = moderate chance, 3 = high chance   Sitting and Reading? Watching Television? Sitting inactive in a public place (theater or meeting)? As a passenger in a car for an hour without a break? Lying down in the afternoon when circumstances permit? Sitting and talking to someone? Sitting quietly after lunch without alcohol? In a car, while stopped for a few minutes in traffic?   Total = 1/ 24 points   FSS endorsed at 9 from 18/ 63 points. Not endorsing depression!    Alert, oriented.   Social History   Socioeconomic History   Marital status: Divorced     Spouse name: Not on file   Number of children: 3   Years of education: Not on file   Highest education level: Not on file  Occupational History   Not on file  Tobacco Use   Smoking status: Never   Smokeless tobacco: Never  Vaping Use   Vaping Use: Never used  Substance and Sexual Activity   Alcohol use: No   Drug use: No   Sexual activity: Not on file  Other Topics Concern   Not on file  Social History Narrative   Not on file   Social Determinants of Health   Financial Resource Strain: Not on file  Food Insecurity: Not on file  Transportation Needs: Not on file  Physical Activity: Not on file  Stress: Not on file  Social Connections: Not on file    Family History  Problem Relation Age of Onset   Hypertension Mother 102   Thyroid disease Mother    Healthy Father 52    Past Medical History:  Diagnosis Date   Cardiomegaly    Chronic combined systolic and diastolic heart failure ([place]) 02/10/2018   DCM (dilated cardiomyopathy) ([place])    Encounter for adjustment of biventricular implantable cardioverter-defibrillator (ICD) 04/03/2019   Fatigue    ICD- Biventricular Medtronic ICD Claria Mri [id number] in situ 02/28/18 03/01/2018   LBBB (left bundle branch block)    Mild tricuspid regurgitation    Moderate aortic regurgitation    Moderate mitral regurgitation    Pericardial effusion    INSIGNIFICANT   S/P biventricular cardiac pacemaker procedure 02/28/18 with ICD MDT  03/01/2018   Short of breath on exertion    Systolic heart failure ([place])    Trace pulmonic regurgitation by prior echocardiogram     Past Surgical History:  Procedure Laterality Date   BIV ICD INSERTION CRT-D N/A 02/28/2018   Procedure: BIV ICD INSERTION CRT-D;  Surgeon: Deboraha Sprang, MD;  Location: [facility];  Service: Cardiovascular;  Laterality:  N/A;   LEG SURGERY Left 2000   DUE TO BROKE LEG     Current Outpatient Medications on File Prior to Visit  Medication Sig Dispense Refill    carvedilol (COREG) 3.125 MG tablet Take 1 tablet (3.125 mg total) by mouth 2 (two) times daily with a meal. 180 tablet 1   dapagliflozin propanediol (FARXIGA) 10 MG TABS tablet Take 1 tablet (10 mg total) by mouth daily before breakfast. 30 tablet 3   magnesium oxide (MAG-OX) 400 MG tablet Take 400 mg by mouth 2 (two) times daily.     torsemide (DEMADEX) 20 MG tablet Take 1 tablet (20 mg total) by mouth 2 (two) times daily. Take extra tab every Mon, Wed, and Fri 72 tablet 3   No current facility-administered medications on file prior to visit.    Allergies  Allergen Reactions   Losartan Potassium Anaphylaxis    angioedema   Aspirin Other (See Comments)    Stomach pain     Physical exam:  Today's Vitals   10/05/20 1006  BP: 96/60  Pulse: 68  Weight: 165 lb (74.8 kg)  Height: 6' (1.829 m)   Body mass index is 22.38 kg/m.   Wt Readings from Last 3 Encounters:  10/05/20 165 lb (74.8 kg)  10/03/20 170 lb 3.2 oz (77.2 kg)  07/14/20 168 lb (76.2 kg)     Ht Readings from Last 3 Encounters:  10/05/20 6' (1.829 m)  07/14/20 6' (1.829 m)  06/20/20 6' (1.829 m)      General: The patient is awake, alert and appears not in acute distress.  The patient is well groomed. Head: Normocephalic, atraumatic. Neck is supple. Mallampati 2. neck circumference:14. 25  Inches.  Nasal airflow patent.  Retrognathia is not  seen.  Dental status:  Cardiovascular:  Regular rate and cardiac rhythm by pulse,  without distended neck veins. Respiratory: Lungs are clear to auscultation.  Skin:  Without evidence of ankle edema, or rash. Trunk: The patient's posture is erect.   Neurologic exam : The patient is awake and alert, oriented to place and time.   Memory subjective described as intact.  Attention span & concentration ability appears normal.  Speech is fluent,  without  dysarthria, dysphonia or aphasia.  Mood and affect are appropriate.   Cranial nerves: no loss of smell or taste reported   Pupils are equal and briskly reactive to light. Funduscopic exam deferred.  Extraocular movements in vertical and horizontal planes were intact and without nystagmus. No Diplopia. Visual fields by finger perimetry are intact. Hearing was intact to soft voice and finger rubbing. Facial sensation intact to fine touch. Facial motor strength is symmetric and tongue and uvula move midline.  Neck ROM : rotation, tilt and flexion extension were normal for age and shoulder shrug was symmetrical.    Motor exam:  Symmetric bulk, tone and ROM.  Normal tone . Sensory:  Fine touch, pinprick and vibration were normal.   Coordination: Rapid alternating movements in the fingers/hands were of normal speed.  The Finger-to-nose maneuver was intact without evidence of ataxia, dysmetria or tremor.  Gait and station: Patient could rise unassisted from a seated position, walked without assistive device.  Toe and heel walk were deferred.  Deep tendon reflexes: in the  upper and lower extremities are symmetric and intact.  Babinski response was deferred .    Miguel Patterson presents today with a nonischemic dilated cardiomyopathy and severely depressed left ventricular systolic function.  10-21 echo showed 25-30% EF-  improved form before-Ejection fraction had been in the teens for the last 3 years.  A cardiac MRI shows a severely dilated left ventricle with normal wall thickness and an LVEF of 14%.  This combined systolic and diastolic heart failure has been treated with the implanted cardioverter defibrillator and ICD biventricular Medtronic.  The patient also has multi valvular regurgitation, he reports that he feels rather well but he does have a lot of nocturia.  In short, Miguel Patterson is presenting with dilated cardiomyopathy and 2 ONOs indicating severe hypoxemia.  Nocturnal oximetry was performed on 19 May it showed 53 minutes of oxygen desaturation under 88% and 77 minutes of oxygen desaturation under 89% the  lowest nadir SPO2 was 76%.       After spending a total time of 25 minutes face to face and additional time for physical and neurologic examination, review of laboratory studies,  personal review of imaging studies, reports and results of other testing and review of referral information / records as far as provided in visit, I have established the following Plan.  1)   Severe  central apnea with hypoxemia  resonded to ASV at current settings, after failing  CPAp, BiPAP and BiPAP ST. Cheyne stokes respiration.   Please note - the patient is no longer using oxygen at home. He is successfully reducing his AHI to 2.9/h and has had spotty compliance on ASV at month due to facial pain, this has resolved .   He walked 6.5 miles yesterday!   I would like to thank Dr Einar Gip, MD, again, for allowing me to meet with and to take care of this pleasant patient.  He will have a new echocardiogram in a couple of months I understand - I am looking forward to follow up in 12 months.    Electronically signed by: Larey Seat, MD 10/05/2020 10:41 AM  [facility] Board certified by The [employer] of Sleep Medicine and Diplomate of the [employer] of Sleep Medicine. Board certified In Neurology through the [place], Fellow of the [employer] of Neurology. Medical Director of [facility].

## 2020-10-10 ENCOUNTER — Telehealth (HOSPITAL_COMMUNITY): Payer: Self-pay | Admitting: Pharmacy Technician

## 2020-10-10 ENCOUNTER — Other Ambulatory Visit (HOSPITAL_COMMUNITY): Payer: Self-pay

## 2020-10-10 NOTE — Telephone Encounter (Signed)
Advanced Heart Failure Patient Advocate Encounter  Received a patient assistance renewal application for Farxiga from [employer]. The patient is in the donut hole. The 30 day co-pay is, $140. The patient was approved for a PAN HF grant. Will not seek renewal at this time.  Member ID: [id number] Group ID: [id number] RxBin ID: [id number] PCN: [id number] Eligibility Start Date: 07/12/2020 Eligibility End Date: 10/09/2021 Assistance Amount: $1,000.00  The billing information was emailed to the patient. Sent 90 day RX request to [facility] ([place]) to send to [facility] with the attached grant information.   Charlann Boxer, CPhT

## 2020-10-11 ENCOUNTER — Other Ambulatory Visit (HOSPITAL_COMMUNITY): Payer: Self-pay | Admitting: *Deleted

## 2020-10-11 MED ORDER — DAPAGLIFLOZIN PROPANEDIOL 10 MG PO TABS
10.0000 mg | ORAL_TABLET | Freq: Every day | ORAL | 3 refills | Status: DC
Start: 2020-10-11 — End: 2021-01-09

## 2020-10-14 ENCOUNTER — Ambulatory Visit: Payer: Medicare Other | Admitting: Student

## 2020-10-17 ENCOUNTER — Other Ambulatory Visit: Payer: Self-pay

## 2020-10-17 ENCOUNTER — Ambulatory Visit (HOSPITAL_COMMUNITY)
Admission: RE | Admit: 2020-10-17 | Discharge: 2020-10-17 | Disposition: A | Discharge status: Home / Self Care | Payer: Medicare Other | Source: Ambulatory Visit | Attending: Cardiology | Admitting: Cardiology

## 2020-10-17 DIAGNOSIS — I5042 Chronic combined systolic (congestive) and diastolic (congestive) heart failure: Secondary | ICD-10-CM | POA: Diagnosis present

## 2020-10-17 LAB — BASIC METABOLIC PANEL
Anion gap: 9 (ref 5–15)
BUN: 50 mg/dL — ABNORMAL HIGH (ref 8–23)
CO2: 29 mmol/L (ref 22–32)
Calcium: 9.1 mg/dL (ref 8.9–10.3)
Chloride: 98 mmol/L (ref 98–111)
Creatinine, Ser: 1.98 mg/dL — ABNORMAL HIGH (ref 0.61–1.24)
GFR, Estimated: 35 mL/min — ABNORMAL LOW (ref >60)
Glucose, Bld: 146 mg/dL — ABNORMAL HIGH (ref 70–99)
Potassium: 4.1 mmol/L (ref 3.5–5.1)
Sodium: 136 mmol/L (ref 135–145)

## 2020-11-15 ENCOUNTER — Other Ambulatory Visit: Payer: Self-pay

## 2020-11-15 ENCOUNTER — Ambulatory Visit: Payer: Medicare Other | Admitting: Student

## 2020-11-15 ENCOUNTER — Encounter: Payer: Self-pay | Admitting: Student

## 2020-11-15 VITALS — BP 98/74 | HR 63 | Temp 98.0°F | Resp 16 | Ht 72.0 in | Wt 170.0 lb

## 2020-11-15 DIAGNOSIS — I5022 Chronic systolic (congestive) heart failure: Secondary | ICD-10-CM

## 2020-11-15 DIAGNOSIS — R0609 Other forms of dyspnea: Secondary | ICD-10-CM

## 2020-11-15 MED ORDER — TORSEMIDE 20 MG PO TABS: 40.0000 mg | ORAL_TABLET | Freq: Two times a day (BID) | ORAL | 3 refills | Status: DC

## 2020-11-15 NOTE — Progress Notes (Signed)
Primary Physician:  [name], MD   Patient ID: Miguel Patterson, male    DOB: 04-Mar-1945, 75 y.o.   MRN: [id number]   Chief Complaint  Patient presents with   Shortness of Breath   HPI    Miguel Patterson  is a 75 y.o. male  from [place]. He has severe non ischemic dilated cardiomyopathy with severely depressed left-ventricular systolic function, Ejection fraction has been in teens for past 3 years in spite of optimal medical therapy. 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 Underwent BIV ICD CRT insertion on 02/28/2018 with Dr. Caryl Comes for nonischemic cardiomyopathy.  In 06/2019, Dr. Caryl Comes changed BiV ICD settings from adaptive CRT to biventricular pacing.    Patient is also being followed by advanced heart failure team with Dr. Pierre Bali.  He has been evaluated by geneticist Dr. 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  Patient presents for urgent visit at his request with concerns of shortness of breath.  He was recently seen by Dr. Haroldine Laws 10/03/2020 at which time he was doing well and was well compensated.  Patient was advised to take torsemide 40 mg twice daily Monday/Wednesday/Friday and 20 mg twice daily all other days.  Patient has been taking 20 mg twice daily with an additional 20 mg in the middle of the day on Monday/Wednesday/Friday.  Patient reports over the last 3 weeks he has noticed worsening shortness of breath.  He typically walks 4 miles per day, however over the last 3 weeks he has noticed reduced exercise tolerance and is now only able to walk approximately half a mile.  Over the same time.  Patient has noticed that his weight is trending up and he has been experiencing constipation.  Past Medical History:  Diagnosis  Date   Cardiomegaly    Chronic combined systolic and diastolic heart failure ([place]) 02/10/2018   DCM (dilated cardiomyopathy) ([place])    Encounter for adjustment of biventricular implantable cardioverter-defibrillator (ICD) 04/03/2019   Fatigue    ICD- Biventricular Medtronic ICD Claria Mri [id number] in situ 02/28/18 03/01/2018   LBBB (left bundle branch block)    Mild tricuspid regurgitation    Moderate aortic regurgitation    Moderate mitral regurgitation    Pericardial effusion    INSIGNIFICANT   S/P biventricular cardiac pacemaker procedure 02/28/18 with ICD MDT  03/01/2018   Short of breath on exertion    Systolic heart failure (HCC)    Trace pulmonic regurgitation by prior echocardiogram    Past Surgical History:  Procedure Laterality Date   BIV ICD INSERTION CRT-D N/A 02/28/2018   Procedure: BIV ICD INSERTION CRT-D;  Surgeon: Deboraha Sprang, MD;  Location: [facility];  Service: Cardiovascular;  Laterality: N/A;   LEG SURGERY Left 2000   DUE TO BROKE LEG   Family History  Problem Relation Age of Onset   Hypertension Mother 32   Thyroid disease Mother    Healthy Father 93   Social History   Tobacco Use   Smoking status: Never   Smokeless tobacco: Never  Substance Use Topics   Alcohol use: No   Marital Status: Divorced  ROS:   Review of Systems  Constitutional: Positive for weight gain.  Cardiovascular:  Positive for dyspnea on exertion. Negative for chest pain, leg swelling, orthopnea and paroxysmal  nocturnal dyspnea.  Gastrointestinal:  Positive for constipation. Negative for melena.   Objective:  Blood pressure 98/74, pulse 63, temperature 98 F (36.7 C), resp. rate 16, height 6' (1.829 m), weight 170 lb (77.1 kg), SpO2 100 %. Body mass index is 23.06 kg/m.  Vitals with BMI 11/15/2020 10/05/2020 10/03/2020  Height 6\' 0"  6\' 0"  -  Weight 170 lbs 165 lbs 170 lbs 3 oz  BMI 96.28 36.62 -  Systolic 98 96 947  Diastolic 74 60 70  Pulse 63 68 76     Physical  Exam Vitals reviewed.  Constitutional:      Appearance: He is well-developed.  Neck:     Vascular: No carotid bruit or JVD.  Cardiovascular:     Rate and Rhythm: Normal rate and regular rhythm.     Pulses: Normal pulses and intact distal pulses.     Heart sounds: S1 normal and S2 normal. Murmur heard.  High-pitched blowing holosystolic murmur is present with a grade of 3/6 at the apex.    No gallop.  Pulmonary:     Effort: Pulmonary effort is normal. No accessory muscle usage or respiratory distress.     Breath sounds: Rales (bilateral bases) present. No wheezing or rhonchi.  Musculoskeletal:     Right lower leg: Edema (1+ pitting) present.     Left lower leg: Edema (1+ pitting, right worse than left) present.  Neurological:     Mental Status: He is alert.   Laboratory examination:   CMP Latest Ref Rng & Units 10/17/2020 10/03/2020 07/06/2020  Glucose 70 - 99 mg/dL 146(H) 79 86  BUN 8 - 23 mg/dL 50(H) 34(H) 33(H)  Creatinine 0.61 - 1.24 mg/dL 1.98(H) 1.66(H) 1.68(H)  Sodium 135 - 145 mmol/L 136 140 141  Potassium 3.5 - 5.1 mmol/L 4.1 3.7 4.6  Chloride 98 - 111 mmol/L 98 103 101  CO2 22 - 32 mmol/L 29 28 26   Calcium 8.9 - 10.3 mg/dL 9.1 8.9 9.2  Total Protein 6.5 - 8.1 g/dL - - -  Total Bilirubin 0.3 - 1.2 mg/dL - - -  Alkaline Phos 38 - 126 U/L - - -  AST 15 - 41 U/L - - -  ALT 0 - 44 U/L - - -   CBC Latest Ref Rng & Units 02/10/2020 02/25/2018  WBC 4.0 - 10.5 K/uL 4.5 5.2  Hemoglobin 13.0 - 17.0 g/dL 12.9(L) 14.1  Hematocrit 39.0 - 52.0 % 40.7 43.3  Platelets 150 - 400 K/uL 135(L) 137(L)   Lipid Panel     Component Value Date/Time   CHOL 179 03/20/2019 0804   TRIG 61 03/20/2019 0804   HDL 51 03/20/2019 0804   LDLCALC 116 (H) 03/20/2019 0804   HEMOGLOBIN A1C No results found for: HGBA1C, MPG TSH No results for input(s): TSH in the last 8760 hours.  Current Outpatient Medications on File Prior to Visit  Medication Sig Dispense Refill   carvedilol (COREG) 3.125 MG  tablet Take 1 tablet (3.125 mg total) by mouth 2 (two) times daily with a meal. 180 tablet 1   dapagliflozin propanediol (FARXIGA) 10 MG TABS tablet Take 1 tablet (10 mg total) by mouth daily before breakfast. 90 tablet 3   magnesium oxide (MAG-OX) 400 MG tablet Take 400 mg by mouth 2 (two) times daily.     No current facility-administered medications on file prior to visit.    BNP (last 3 results) Recent Labs    06/20/20 1431 07/06/20 0816 10/03/20 1128  BNP 2,191.9* 758.5*  1,705.4*   Radiology:   No results found.  Cardiac Studies:   Exercise sestamibi stress test 08/27/2014: 1. The resting electrocardiogram demonstrated normal sinus rhythm, incomplete LBBB and no resting arrhythmias.  ST segment depression and T-wave inversion in inferior and lateral leads. Stress EKG is negative for ischemia. The patient performed treadmill exercise using a Bruce protocol, completing 6:26 minutes. Thre were occasional PVCs. The patient completed an estimated workload of 7.65 METS, 95% of the maximum predicted heart rate. The stress test was terminated because of dyspnea. 2. The LV is markedly dilated both at rest and stress images. The LV end diastolic volume was 630ZS. Thre is anterior wall thinning. There is no evidence of ischemia. Thre is severe generalized hypokinesis and LVEF markedly depressed at 11%.  Findings are consistent with dilated cardiomyopathy.  Clinical correlation is recommended.   Cardiac MRI 11/20/2017: 1. Severely dilated left ventricle with normal wall thickness and severely decreased systolic function (LVEF = 14%). There is severe diffuse hypokinesis and paradoxical septal motion consistent with LBBB. The ratio of non-compacted to compacted myocardium in the apical and mid portions of the ventricle is > 4:1. There is midwall late gadolinium enhancement in the basal anteroseptal and inferoseptal myocardium. 2. Normal right ventricular size, thickness and systolic function (LVEF =  55%). There are no regional wall motion abnormalities. 3. Severely dilated left atrium (59 mm), normal right atrial size. 4. Moderately dilated pulmonary artery measuring 36 mm. 5. Moderate aortic and mitral regurgitation, mild tricuspid regurgitation. 6. Mild pericardial effusion. Collectively, these findings are consistent with end stage non-compaction cardiomyopathy.  Nocturnal oximetry 05/20/2019: SpO2 <88%:  53 minutes SpO2 <89%:  77 minutes Highest SPO2 99% and lowest SPO2 76%.   Oxygen desaturation events 225, desaturation index 25. Impression: Patient qualifies for nocturnal oxygen supplementation per Medicare guidelines Group 1.  Cardiopulmonary exercise test 11/05/2019: Exercise testing with gas exchange demonstrates normal functional capacity when compared to matched sedentary norms. There is a mild HF limitation with mildly elevated VE/VCO2 slope. There is likely an additional pulmonary limitation component as patient is ventilatory limited at peak exercise and pre-exercise spirometry demonstrates mild restrictive lung physiology.   PCV ECHOCARDIOGRAM COMPLETE 03/30/2020 Severely depressed LV systolic function with visual EF <20%. Left ventricle cavity is severely dilated. Hypokinetic global wall motion. Doppler evidence of grade III (restrictive) diastolic dysfunction, elevated LAP. Calculated EF 31%. Endocardial wires noted within the right cardiac chambers. Left atrial cavity is severely dilated. Mild to moderate aortic regurgitation. Posterior directed jet, wraps the left atrial wall, severe (Grade III) mitral regurgitation. Moderate to severe tricuspid regurgitation. Moderate pulmonary hypertension. RVSP measures 52 mmHg. IVC is dilated with a respiratory response of >50%. Compared to prior study dated 10/09/2019: G2DD is now G3DD, moderate to severe MR is now severe, mild PHTN 37mmHg is now moderate.   EKG   04/15/2020: AV paced rhythm at a rate of 70 bpm with single  PVC.  No further analysis.  03/18/2019: Probably AV paced rhythm with first-degree AV block, biventricular pacemaker detected.  No further analysis. Single PVC.   ICD   Remote biventricular ICD transmission 07/03/2020: No high atrial rate episodes. 11 VHR episodes, brief NSVT max 6 sec.  Longevity 6.6 years.  Lead impedance and thresholds within normal limits. AP 85%, BP 94%. Thoracic impedance at baseline.  Remote CRT-D transmission 11/08/2020: AP 79%, CRT 87%. Lead impedance and threshold within normal limits. Longevity 5 years and 6 months. Thoracic impedance is at baseline and does not suggest volume  overload state. There were no mode switches, no high ventricular rate episodes. Normal CRT-D function.  Assessment:     ICD-10-CM   1. Dyspnea on exertion  R06.09 EKG [id number]    Basic metabolic panel    Brain natriuretic peptide    2. Chronic systolic heart failure (HCC)  I50.22 torsemide (DEMADEX) 20 MG tablet    Basic metabolic panel    Brain natriuretic peptide      Meds ordered this encounter  Medications   torsemide (DEMADEX) 20 MG tablet    Sig: Take 2 tablets (40 mg total) by mouth 2 (two) times daily. Take extra tab every Mon, Wed, and Fri    Dispense:  72 tablet    Refill:  3    Please cancel all previous orders for current medication. Change in dosage or pill size.    Medications Discontinued During This Encounter  Medication Reason   torsemide (DEMADEX) 20 MG tablet    Recommendations:   Miguel Patterson  is a 75 y.o. from [place]. He has severe non ischemic dilated cardiomyopathy with severely depressed left-ventricular systolic function, Ejection fraction has been in teens for past 3 years in spite of optimal medical therapy. 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 Underwent BIV ICD CRT insertion on 02/28/2018 with Dr. Caryl Comes for  nonischemic cardiomyopathy.   Patient has also been evaluated by Dr. Haroldine Laws who titrated patient's heart failure medications and started him on Farxiga.  Patient was also evaluated by Dr. 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  Patient presents for urgent visit at his request with worsening dyspnea on exertion and weight gain over the last 2 to 3 weeks.  Patient typically walks 4 miles per day without issue, however he now is only able to walk approximately half a mile.  Reviewed remote ICD transmission from 11/08/2020 which revealed normal function and no evidence of volume overload state.  Advised patient to increase Lasix to 40 mg twice daily every day and will repeat BMP and BNP in 1 week.  Follow-up in 1 week, sooner if needed, for HFrEF.   Alethia Berthold, PA-C 11/15/2020, 3:42 PM Office: [phone]

## 2020-11-21 NOTE — Progress Notes (Signed)
Primary Physician:  [name], MD   Miguel Patterson ID: Miguel Patterson, male    DOB: April 28, 1945, 75 y.o.   MRN: [id number]   Chief Complaint  Miguel Patterson presents with   Shortness of Breath   Congestive Heart Failure   Follow-up    1 week   HPI    Miguel Patterson  is a 75 y.o. male  from [place]. Miguel Patterson has severe non ischemic dilated cardiomyopathy with severely depressed left-ventricular systolic function, Ejection fraction has been in teens for past 3 years in spite of optimal medical therapy. 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 Underwent BIV ICD CRT insertion on 02/28/2018 with Dr. Caryl Comes for nonischemic cardiomyopathy.  In 06/2019, Dr. Caryl Comes changed BiV ICD settings from adaptive CRT to biventricular pacing.    Miguel Patterson is also being followed by advanced heart failure team with Dr. Pierre Bali.  Miguel Patterson has been evaluated by geneticist Dr. 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  Miguel Patterson presents for 1 week follow-up of HFrEF.  Miguel Patterson had come to our office for urgent visit 11/15/2020 with concerns of worsening dyspnea on exertion and weight gain over the last 2 to 3 weeks.  ICD showed no evidence of volume overload and was functioning normally.  Advised Miguel Patterson to increase torsemide to 40 mg twice daily every day and repeat BMP and BNP in 1 week, which are pending.  Unfortunately Miguel Patterson has only been taking torsemide 20 mg twice daily instead of 40 mg.  Miguel Patterson has noticed mild improvement in his dyspnea on exertion, however Miguel Patterson is still not able to exercise and weight appears to have trended up since last office visit.  Past Medical History:  Diagnosis Date   Cardiomegaly    Chronic combined systolic and diastolic heart failure ([place]) 02/10/2018   DCM  (dilated cardiomyopathy) ([place])    Encounter for adjustment of biventricular implantable cardioverter-defibrillator (ICD) 04/03/2019   Fatigue    ICD- Biventricular Medtronic ICD Claria Mri [id number] in situ 02/28/18 03/01/2018   LBBB (left bundle branch block)    Mild tricuspid regurgitation    Moderate aortic regurgitation    Moderate mitral regurgitation    Pericardial effusion    INSIGNIFICANT   S/P biventricular cardiac pacemaker procedure 02/28/18 with ICD MDT  03/01/2018   Short of breath on exertion    Systolic heart failure (HCC)    Trace pulmonic regurgitation by prior echocardiogram    Past Surgical History:  Procedure Laterality Date   BIV ICD INSERTION CRT-D N/A 02/28/2018   Procedure: BIV ICD INSERTION CRT-D;  Surgeon: Deboraha Sprang, MD;  Location: [facility];  Service: Cardiovascular;  Laterality: N/A;   LEG SURGERY Left 2000   DUE TO BROKE LEG   Family History  Problem Relation Age of Onset   Hypertension Mother 69   Thyroid disease Mother    Healthy Father 54   Social History   Tobacco Use   Smoking status: Never   Smokeless tobacco: Never  Substance Use Topics   Alcohol use: No   Marital Status: Divorced  ROS:   Review of Systems  Constitutional: Positive for weight gain.  Cardiovascular:  Positive for dyspnea on exertion. Negative for chest pain, leg swelling, orthopnea and paroxysmal nocturnal dyspnea.  Gastrointestinal:  Positive for constipation. Negative for melena.  Objective:  Blood pressure 105/67, pulse 62, temperature 97.8 F (36.6 C), temperature source Temporal, resp. rate 17, height 6' (1.829 m), weight 178 lb 6.4 oz (80.9 kg), SpO2 98 %. Body mass index is 24.2 kg/m.  Vitals with BMI 11/22/2020 11/15/2020 10/05/2020  Height 6\' 0"  6\' 0"  6\' 0"   Weight 178 lbs 6 oz 170 lbs 165 lbs  BMI 24.19 66.44 03.47  Systolic 425 98 96  Diastolic 67 74 60  Pulse 62 63 68     Physical Exam Vitals reviewed.  Constitutional:      Appearance: Miguel Patterson  is well-developed.  Neck:     Vascular: No carotid bruit or JVD.  Cardiovascular:     Rate and Rhythm: Normal rate and regular rhythm.     Pulses: Normal pulses and intact distal pulses.     Heart sounds: S1 normal and S2 normal. Murmur heard.  High-pitched blowing holosystolic murmur is present with a grade of 3/6 at the apex.    No gallop.  Pulmonary:     Effort: Pulmonary effort is normal. No accessory muscle usage or respiratory distress.     Breath sounds: Rales (bilateral bases) present. No wheezing or rhonchi.  Musculoskeletal:     Right lower leg: Edema (1+ pitting) present.     Left lower leg: Edema (1+ pitting, right worse than left) present.  Neurological:     Mental Status: Miguel Patterson is alert.  Physical exam unchanged compared to previous office visit.   Laboratory examination:   CMP Latest Ref Rng & Units 10/17/2020 10/03/2020 07/06/2020  Glucose 70 - 99 mg/dL 146(H) 79 86  BUN 8 - 23 mg/dL 50(H) 34(H) 33(H)  Creatinine 0.61 - 1.24 mg/dL 1.98(H) 1.66(H) 1.68(H)  Sodium 135 - 145 mmol/L 136 140 141  Potassium 3.5 - 5.1 mmol/L 4.1 3.7 4.6  Chloride 98 - 111 mmol/L 98 103 101  CO2 22 - 32 mmol/L 29 28 26   Calcium 8.9 - 10.3 mg/dL 9.1 8.9 9.2  Total Protein 6.5 - 8.1 g/dL - - -  Total Bilirubin 0.3 - 1.2 mg/dL - - -  Alkaline Phos 38 - 126 U/L - - -  AST 15 - 41 U/L - - -  ALT 0 - 44 U/L - - -   CBC Latest Ref Rng & Units 02/10/2020 02/25/2018  WBC 4.0 - 10.5 K/uL 4.5 5.2  Hemoglobin 13.0 - 17.0 g/dL 12.9(L) 14.1  Hematocrit 39.0 - 52.0 % 40.7 43.3  Platelets 150 - 400 K/uL 135(L) 137(L)   Lipid Panel     Component Value Date/Time   CHOL 179 03/20/2019 0804   TRIG 61 03/20/2019 0804   HDL 51 03/20/2019 0804   LDLCALC 116 (H) 03/20/2019 0804   HEMOGLOBIN A1C No results found for: HGBA1C, MPG TSH No results for input(s): TSH in the last 8760 hours.  Current Outpatient Medications on File Prior to Visit  Medication Sig Dispense Refill   carvedilol (COREG) 3.125 MG  tablet Take 1 tablet (3.125 mg total) by mouth 2 (two) times daily with a meal. 180 tablet 1   dapagliflozin propanediol (FARXIGA) 10 MG TABS tablet Take 1 tablet (10 mg total) by mouth daily before breakfast. 90 tablet 3   magnesium oxide (MAG-OX) 400 MG tablet Take 400 mg by mouth 2 (two) times daily.     torsemide (DEMADEX) 20 MG tablet Take 2 tablets (40 mg total) by mouth 2 (two) times daily. Take extra tab every Mon, Wed, and Fri 72 tablet 3  No current facility-administered medications on file prior to visit.    BNP (last 3 results) Recent Labs    06/20/20 1431 07/06/20 0816 10/03/20 1128  BNP 2,191.9* 758.5* 1,705.4*   Radiology:   No results found.  Cardiac Studies:   Exercise sestamibi stress test 08/27/2014: 1. The resting electrocardiogram demonstrated normal sinus rhythm, incomplete LBBB and no resting arrhythmias.  ST segment depression and T-wave inversion in inferior and lateral leads. Stress EKG is negative for ischemia. The Miguel Patterson performed treadmill exercise using a Bruce protocol, completing 6:26 minutes. Thre were occasional PVCs. The Miguel Patterson completed an estimated workload of 7.65 METS, 95% of the maximum predicted heart rate. The stress test was terminated because of dyspnea. 2. The LV is markedly dilated both at rest and stress images. The LV end diastolic volume was 638GY. Thre is anterior wall thinning. There is no evidence of ischemia. Thre is severe generalized hypokinesis and LVEF markedly depressed at 11%.  Findings are consistent with dilated cardiomyopathy.  Clinical correlation is recommended.   Cardiac MRI 11/20/2017: 1. Severely dilated left ventricle with normal wall thickness and severely decreased systolic function (LVEF = 14%). There is severe diffuse hypokinesis and paradoxical septal motion consistent with LBBB. The ratio of non-compacted to compacted myocardium in the apical and mid portions of the ventricle is > 4:1. There is midwall late  gadolinium enhancement in the basal anteroseptal and inferoseptal myocardium. 2. Normal right ventricular size, thickness and systolic function (LVEF = 55%). There are no regional wall motion abnormalities. 3. Severely dilated left atrium (59 mm), normal right atrial size. 4. Moderately dilated pulmonary artery measuring 36 mm. 5. Moderate aortic and mitral regurgitation, mild tricuspid regurgitation. 6. Mild pericardial effusion. Collectively, these findings are consistent with end stage non-compaction cardiomyopathy.  Nocturnal oximetry 05/20/2019: SpO2 <88%:  53 minutes SpO2 <89%:  77 minutes Highest SPO2 99% and lowest SPO2 76%.   Oxygen desaturation events 225, desaturation index 25. Impression: Miguel Patterson qualifies for nocturnal oxygen supplementation per Medicare guidelines Group 1.  Cardiopulmonary exercise test 11/05/2019: Exercise testing with gas exchange demonstrates normal functional capacity when compared to matched sedentary norms. There is a mild HF limitation with mildly elevated VE/VCO2 slope. There is likely an additional pulmonary limitation component as Miguel Patterson is ventilatory limited at peak exercise and pre-exercise spirometry demonstrates mild restrictive lung physiology.   PCV ECHOCARDIOGRAM COMPLETE 03/30/2020 Severely depressed LV systolic function with visual EF <20%. Left ventricle cavity is severely dilated. Hypokinetic global wall motion. Doppler evidence of grade III (restrictive) diastolic dysfunction, elevated LAP. Calculated EF 31%. Endocardial wires noted within the right cardiac chambers. Left atrial cavity is severely dilated. Mild to moderate aortic regurgitation. Posterior directed jet, wraps the left atrial wall, severe (Grade III) mitral regurgitation. Moderate to severe tricuspid regurgitation. Moderate pulmonary hypertension. RVSP measures 52 mmHg. IVC is dilated with a respiratory response of >50%. Compared to prior study dated 10/09/2019: G2DD is now  G3DD, moderate to severe MR is now severe, mild PHTN 52mmHg is now moderate.   EKG   04/15/2020: AV paced rhythm at a rate of 70 bpm with single PVC.  No further analysis.  03/18/2019: Probably AV paced rhythm with first-degree AV block, biventricular pacemaker detected.  No further analysis. Single PVC.   ICD   Remote biventricular ICD transmission 07/03/2020: No high atrial rate episodes. 11 VHR episodes, brief NSVT max 6 sec.  Longevity 6.6 years.  Lead impedance and thresholds within normal limits. AP 85%, BP 94%. Thoracic impedance at baseline.  Remote CRT-D transmission 11/08/2020: AP 79%, CRT 87%. Lead impedance and threshold within normal limits. Longevity 5 years and 6 months. Thoracic impedance is at baseline and does not suggest volume overload state. There were no mode switches, no high ventricular rate episodes. Normal CRT-D function.  Assessment:     ICD-10-CM   1. Acute on chronic systolic heart failure (HCC)  I50.23       No orders of the defined types were placed in this encounter.   There are no discontinued medications.  Recommendations:   Miguel Patterson  is a 75 y.o. from [place]. Miguel Patterson has severe non ischemic dilated cardiomyopathy with severely depressed left-ventricular systolic function, Ejection fraction has been in teens for past 3 years in spite of optimal medical therapy. 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 Underwent BIV ICD CRT insertion on 02/28/2018 with Dr. Caryl Comes for nonischemic cardiomyopathy.   Miguel Patterson has also been evaluated by Dr. Haroldine Laws who titrated Miguel Patterson's heart failure medications and started him on Farxiga.  Miguel Patterson was also evaluated by Dr. 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  Miguel Patterson  presents for 1 week follow-up of HFrEF.  Miguel Patterson had come to our office for urgent visit 11/15/2020 with concerns of worsening dyspnea on exertion and weight gain over the last 2 to 3 weeks.  ICD showed no evidence of volume overload and was functioning normally.  Advised Miguel Patterson to increase torsemide to 40 mg twice daily every day and repeat BMP and BNP in 1 week, which are pending.  Unfortunately Miguel Patterson has only been taking torsemide 20 mg twice daily.  Miguel Patterson has had mild improvement of symptoms since last office visit, however Miguel Patterson continues to have dyspnea on exertion.  Miguel Patterson likely would benefit from taking torsemide 40 mg twice daily as directed at last office visit.  However BMP is pending, will therefore hold off on increasing diuretics until I am able to reevaluate kidney function.  Further recommendations pending lab results.  Follow-up in 2 weeks, sooner if needed, for HFrEF.

## 2020-11-22 ENCOUNTER — Other Ambulatory Visit: Payer: Self-pay

## 2020-11-22 ENCOUNTER — Ambulatory Visit: Payer: Medicare Other | Admitting: Student

## 2020-11-22 ENCOUNTER — Encounter: Payer: Self-pay | Admitting: Student

## 2020-11-22 VITALS — BP 105/67 | HR 62 | Temp 97.8°F | Resp 17 | Ht 72.0 in | Wt 178.4 lb

## 2020-11-22 DIAGNOSIS — I5022 Chronic systolic (congestive) heart failure: Secondary | ICD-10-CM

## 2020-11-22 DIAGNOSIS — I5023 Acute on chronic systolic (congestive) heart failure: Secondary | ICD-10-CM

## 2020-11-22 LAB — BASIC METABOLIC PANEL
BUN/Creatinine Ratio: 24 (ref 10–24)
BUN: 43 mg/dL — ABNORMAL HIGH (ref 8–27)
CO2: 29 mmol/L (ref 20–29)
Calcium: 9.2 mg/dL (ref 8.6–10.2)
Chloride: 104 mmol/L (ref 96–106)
Creatinine, Ser: 1.82 mg/dL — ABNORMAL HIGH (ref 0.76–1.27)
Glucose: 130 mg/dL — ABNORMAL HIGH (ref 70–99)
Potassium: 4 mmol/L (ref 3.5–5.2)
Sodium: 146 mmol/L — ABNORMAL HIGH (ref 134–144)
eGFR: 38 mL/min/{1.73_m2} — ABNORMAL LOW (ref >59)

## 2020-11-22 LAB — BRAIN NATRIURETIC PEPTIDE: BNP: 2117.5 pg/mL — ABNORMAL HIGH (ref 0.0–100.0)

## 2020-11-23 ENCOUNTER — Other Ambulatory Visit: Payer: Self-pay | Admitting: Student

## 2020-11-23 DIAGNOSIS — I5022 Chronic systolic (congestive) heart failure: Secondary | ICD-10-CM

## 2020-11-23 NOTE — Addendum Note (Signed)
Addended by: Lawerance Cruel L on: 11/23/2020 12:14 PM   Modules accepted: Orders

## 2020-11-28 ENCOUNTER — Encounter: Payer: Self-pay | Admitting: Cardiology

## 2020-11-30 ENCOUNTER — Encounter: Payer: Self-pay | Admitting: Cardiology

## 2020-11-30 NOTE — Telephone Encounter (Signed)
From patient.

## 2020-12-03 LAB — BASIC METABOLIC PANEL
BUN/Creatinine Ratio: 30 — ABNORMAL HIGH (ref 10–24)
BUN: 54 mg/dL — ABNORMAL HIGH (ref 8–27)
CO2: 27 mmol/L (ref 20–29)
Calcium: 8.7 mg/dL (ref 8.6–10.2)
Chloride: 97 mmol/L (ref 96–106)
Creatinine, Ser: 1.78 mg/dL — ABNORMAL HIGH (ref 0.76–1.27)
Glucose: 129 mg/dL — ABNORMAL HIGH (ref 70–99)
Potassium: 3.5 mmol/L (ref 3.5–5.2)
Sodium: 141 mmol/L (ref 134–144)
eGFR: 40 mL/min/{1.73_m2} — ABNORMAL LOW (ref >59)

## 2020-12-03 LAB — BRAIN NATRIURETIC PEPTIDE: BNP: 1791.6 pg/mL — ABNORMAL HIGH (ref 0.0–100.0)

## 2020-12-06 ENCOUNTER — Ambulatory Visit
Admission: RE | Admit: 2020-12-06 | Discharge: 2020-12-06 | Disposition: A | Discharge status: Home / Self Care | Payer: Medicare Other | Source: Ambulatory Visit | Attending: Student | Admitting: Student

## 2020-12-06 ENCOUNTER — Ambulatory Visit: Payer: Medicare Other | Admitting: Student

## 2020-12-06 ENCOUNTER — Encounter: Payer: Self-pay | Admitting: Student

## 2020-12-06 ENCOUNTER — Other Ambulatory Visit: Payer: Self-pay

## 2020-12-06 VITALS — BP 101/59 | HR 58 | Temp 98.0°F | Ht 72.0 in | Wt 178.0 lb

## 2020-12-06 DIAGNOSIS — I5022 Chronic systolic (congestive) heart failure: Secondary | ICD-10-CM

## 2020-12-06 DIAGNOSIS — R0609 Other forms of dyspnea: Secondary | ICD-10-CM

## 2020-12-06 NOTE — Addendum Note (Signed)
Addended by: Lawerance Cruel L on: 12/06/2020 11:04 AM   Modules accepted: Orders

## 2020-12-06 NOTE — Progress Notes (Addendum)
Primary Physician:  [name], MD   Patient ID: Miguel Patterson, male    DOB: September 05, 1945, 75 y.o.   MRN: [id number]   Chief Complaint  Patient presents with   Acute on chronic systolic heart failure    Follow-up   Results   HPI    Miguel Patterson  is a 75 y.o. male  from [place]. He has severe non ischemic dilated cardiomyopathy with severely depressed left-ventricular systolic function, Ejection fraction has been in teens for past 3 years in spite of optimal medical therapy. 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 Underwent BIV ICD CRT insertion on 02/28/2018 with Dr. Caryl Comes for nonischemic cardiomyopathy.  In 06/2019, Dr. Caryl Comes changed BiV ICD settings from adaptive CRT to biventricular pacing.    Patient is also being followed by advanced heart failure team with Dr. Pierre Bali.  He has been evaluated by geneticist Dr. 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   Patient presents for 2-week follow-up of HFrEF.  Last visit advised patient to take torsemide 40 mg twice daily with repeat labs which showed improvement of BNP and kidney function remained stable.  ICD transmission on 11/27/2020 showed no evidence of volume overload state. Patients symptoms have only minimally improved since last office visit.  He continues to have dyspnea on exertion and is not able to exercise as he used to.  His weight at home has stabilized at approximately 168 pounds.  Past Medical History:  Diagnosis Date   Cardiomegaly    Chronic combined systolic and diastolic heart failure ([facility]) 02/10/2018   DCM (dilated cardiomyopathy) ([facility])    Encounter for adjustment of biventricular implantable cardioverter-defibrillator (ICD) 04/03/2019   Fatigue    ICD-  Biventricular Medtronic ICD Claria Mri [id number] in situ 02/28/18 03/01/2018   LBBB (left bundle branch block)    Mild tricuspid regurgitation    Moderate aortic regurgitation    Moderate mitral regurgitation    Pericardial effusion    INSIGNIFICANT   S/P biventricular cardiac pacemaker procedure 02/28/18 with ICD MDT  03/01/2018   Short of breath on exertion    Systolic heart failure (HCC)    Trace pulmonic regurgitation by prior echocardiogram    Past Surgical History:  Procedure Laterality Date   BIV ICD INSERTION CRT-D N/A 02/28/2018   Procedure: BIV ICD INSERTION CRT-D;  Surgeon: Deboraha Sprang, MD;  Location: [facility];  Service: Cardiovascular;  Laterality: N/A;   LEG SURGERY Left 2000   DUE TO BROKE LEG   Family History  Problem Relation Age of Onset   Hypertension Mother 95   Thyroid disease Mother    Healthy Father 56   Social History   Tobacco Use   Smoking status: Never   Smokeless tobacco: Never  Substance Use Topics   Alcohol use: No   Marital Status: Divorced  ROS:   Review of Systems  Constitutional: Positive for weight loss.  Cardiovascular:  Positive for dyspnea on exertion. Negative for chest pain, leg swelling, orthopnea and paroxysmal nocturnal dyspnea.   Objective:  Blood pressure (!) 101/59, pulse (!) 58, temperature 98 F (36.7 C), temperature source Temporal, height 6' (1.829 m), weight 178 lb (80.7 kg), SpO2 98 %. Body mass index is 24.14 kg/m.  Vitals with BMI 12/06/2020 11/22/2020 11/15/2020  Height 6'  0" 6\' 0"  6\' 0"   Weight 178 lbs 178 lbs 6 oz 170 lbs  BMI 24.14 75.10 25.85  Systolic 277 824 98  Diastolic 59 67 74  Pulse 58 62 63     Physical Exam Vitals reviewed.  Constitutional:      Appearance: He is well-developed.  Neck:     Vascular: No carotid bruit or JVD.  Cardiovascular:     Rate and Rhythm: Normal rate and regular rhythm.     Pulses: Normal pulses and intact distal pulses.     Heart sounds: S1 normal and S2  normal. Murmur heard.  High-pitched blowing holosystolic murmur is present with a grade of 3/6 at the apex.    No gallop.  Pulmonary:     Effort: Pulmonary effort is normal. No accessory muscle usage or respiratory distress.     Breath sounds: Rales (left base) present. No wheezing or rhonchi.  Musculoskeletal:     Right lower leg: Edema (1+ pitting) present.     Left lower leg: Edema (1+ pitting, right worse than left) present.  Neurological:     Mental Status: He is alert.   Laboratory examination:   CMP Latest Ref Rng & Units 12/02/2020 11/21/2020 10/17/2020  Glucose 70 - 99 mg/dL 129(H) 130(H) 146(H)  BUN 8 - 27 mg/dL 54(H) 43(H) 50(H)  Creatinine 0.76 - 1.27 mg/dL 1.78(H) 1.82(H) 1.98(H)  Sodium 134 - 144 mmol/L 141 146(H) 136  Potassium 3.5 - 5.2 mmol/L 3.5 4.0 4.1  Chloride 96 - 106 mmol/L 97 104 98  CO2 20 - 29 mmol/L 27 29 29   Calcium 8.6 - 10.2 mg/dL 8.7 9.2 9.1  Total Protein 6.5 - 8.1 g/dL - - -  Total Bilirubin 0.3 - 1.2 mg/dL - - -  Alkaline Phos 38 - 126 U/L - - -  AST 15 - 41 U/L - - -  ALT 0 - 44 U/L - - -   CBC Latest Ref Rng & Units 02/10/2020 02/25/2018  WBC 4.0 - 10.5 K/uL 4.5 5.2  Hemoglobin 13.0 - 17.0 g/dL 12.9(L) 14.1  Hematocrit 39.0 - 52.0 % 40.7 43.3  Platelets 150 - 400 K/uL 135(L) 137(L)   Lipid Panel     Component Value Date/Time   CHOL 179 03/20/2019 0804   TRIG 61 03/20/2019 0804   HDL 51 03/20/2019 0804   LDLCALC 116 (H) 03/20/2019 0804   HEMOGLOBIN A1C No results found for: HGBA1C, MPG TSH No results for input(s): TSH in the last 8760 hours.  Current Outpatient Medications on File Prior to Visit  Medication Sig Dispense Refill   carvedilol (COREG) 3.125 MG tablet Take 1 tablet (3.125 mg total) by mouth 2 (two) times daily with a meal. 180 tablet 1   dapagliflozin propanediol (FARXIGA) 10 MG TABS tablet Take 1 tablet (10 mg total) by mouth daily before breakfast. 90 tablet 3   magnesium oxide (MAG-OX) 400 MG tablet Take 400 mg by mouth  2 (two) times daily.     torsemide (DEMADEX) 20 MG tablet Take 2 tablets (40 mg total) by mouth 2 (two) times daily. Take extra tab every Mon, Wed, and Fri 72 tablet 3   No current facility-administered medications on file prior to visit.    BNP (last 3 results) Recent Labs    10/03/20 1128 11/21/20 0827 12/02/20 1320  BNP 1,705.4* 2,117.5* 1,791.6*   Radiology:   No results found.  Cardiac Studies:   Exercise sestamibi stress test 08/27/2014: 1. The resting electrocardiogram demonstrated normal  sinus rhythm, incomplete LBBB and no resting arrhythmias.  ST segment depression and T-wave inversion in inferior and lateral leads. Stress EKG is negative for ischemia. The patient performed treadmill exercise using a Bruce protocol, completing 6:26 minutes. Thre were occasional PVCs. The patient completed an estimated workload of 7.65 METS, 95% of the maximum predicted heart rate. The stress test was terminated because of dyspnea. 2. The LV is markedly dilated both at rest and stress images. The LV end diastolic volume was 732KG. Thre is anterior wall thinning. There is no evidence of ischemia. Thre is severe generalized hypokinesis and LVEF markedly depressed at 11%.  Findings are consistent with dilated cardiomyopathy.  Clinical correlation is recommended.   Cardiac MRI 11/20/2017: 1. Severely dilated left ventricle with normal wall thickness and severely decreased systolic function (LVEF = 14%). There is severe diffuse hypokinesis and paradoxical septal motion consistent with LBBB. The ratio of non-compacted to compacted myocardium in the apical and mid portions of the ventricle is > 4:1. There is midwall late gadolinium enhancement in the basal anteroseptal and inferoseptal myocardium. 2. Normal right ventricular size, thickness and systolic function (LVEF = 55%). There are no regional wall motion abnormalities. 3. Severely dilated left atrium (59 mm), normal right atrial size. 4. Moderately  dilated pulmonary artery measuring 36 mm. 5. Moderate aortic and mitral regurgitation, mild tricuspid regurgitation. 6. Mild pericardial effusion. Collectively, these findings are consistent with end stage non-compaction cardiomyopathy.  Nocturnal oximetry 05/20/2019: SpO2 <88%:  53 minutes SpO2 <89%:  77 minutes Highest SPO2 99% and lowest SPO2 76%.   Oxygen desaturation events 225, desaturation index 25. Impression: Patient qualifies for nocturnal oxygen supplementation per Medicare guidelines Group 1.  Cardiopulmonary exercise test 11/05/2019: Exercise testing with gas exchange demonstrates normal functional capacity when compared to matched sedentary norms. There is a mild HF limitation with mildly elevated VE/VCO2 slope. There is likely an additional pulmonary limitation component as patient is ventilatory limited at peak exercise and pre-exercise spirometry demonstrates mild restrictive lung physiology.   PCV ECHOCARDIOGRAM COMPLETE 03/30/2020 Severely depressed LV systolic function with visual EF <20%. Left ventricle cavity is severely dilated. Hypokinetic global wall motion. Doppler evidence of grade III (restrictive) diastolic dysfunction, elevated LAP. Calculated EF 31%. Endocardial wires noted within the right cardiac chambers. Left atrial cavity is severely dilated. Mild to moderate aortic regurgitation. Posterior directed jet, wraps the left atrial wall, severe (Grade III) mitral regurgitation. Moderate to severe tricuspid regurgitation. Moderate pulmonary hypertension. RVSP measures 52 mmHg. IVC is dilated with a respiratory response of >50%. Compared to prior study dated 10/09/2019: G2DD is now G3DD, moderate to severe MR is now severe, mild PHTN 59mmHg is now moderate.  EKG   04/15/2020: AV paced rhythm at a rate of 70 bpm with single PVC.  No further analysis.  03/18/2019: Probably AV paced rhythm with first-degree AV block, biventricular pacemaker detected.  No further  analysis. Single PVC.   ICD   Remote biventricular ICD transmission 07/03/2020: No high atrial rate episodes. 11 VHR episodes, brief NSVT max 6 sec.  Longevity 6.6 years.  Lead impedance and thresholds within normal limits. AP 85%, BP 94%. Thoracic impedance at baseline.  Remote CRT-D transmission 11/27/2020: AP 80%, CRT 87%. Longevity 5 years and 6 months. Lead impedance and thresholds within normal limits. There were no high ventricular rate episodes, no mode switches. Thoracic impedance does not suggest volume overload state pattern. Normal CRT-D function.  Assessment:     ICD-10-CM   1. Chronic systolic heart failure (  [facility])  I50.22       No orders of the defined types were placed in this encounter.   There are no discontinued medications.  Recommendations:   Miguel Patterson  is a 75 y.o. from [place]. He has severe non ischemic dilated cardiomyopathy with severely depressed left-ventricular systolic function, Ejection fraction has been in teens for past 3 years in spite of optimal medical therapy. 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 Underwent BIV ICD CRT insertion on 02/28/2018 with Dr. Caryl Comes for nonischemic cardiomyopathy.   Patient has also been evaluated by Dr. Haroldine Laws who titrated patient's heart failure medications and started him on Farxiga.  Patient was also evaluated by Dr. 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  Patient presents for 2-week follow-up of HFrEF.  Last visit advised patient to take torsemide 40 mg twice daily with repeat labs which showed improvement of BNP and kidney function remained stable.  ICD transmission on 11/27/2020 showed no evidence of volume overload state.  Given that patient's weight has trended down  and stabilized and that BNP has improved and ICD showed no evidence of volume overload suspect patient's worsening dyspnea may be related to noncardiac etiology.  Recommended chest x-ray, CBC, and additional work-up to investigate noncardiac etiologies.  Patient will make appointment with PCP for this week.  Discussed at length with patient that his underlying heart failure certainly contributing to dyspnea on exertion and fatigue, however there is unfortunately not much else to be done from a heart failure standpoint according to both Dr. Einar Gip and Dr. Haroldine Laws.  We will continue current medication regimen.  Follow-up in 3 months, sooner if needed, for HFrEF.   Alethia Berthold, PA-C 12/06/2020, 9:25 AM Office: [phone]  Addendum 12/06/2020: Patient called the office back to notify us that his PCP cannot see him until January.  We will therefore obtain chest x-ray and additional laboratory testing to evaluate for noncardiac etiologies contributing to patient's dyspnea.  Advised patient that if testing is abnormal we will likely have to defer treatment to PCP.  He verbalized understanding and agreement.   Alethia Berthold, PA-C 12/06/2020, 11:04 AM Office: [phone]

## 2020-12-07 LAB — CBC
Hematocrit: 39.7 % (ref 37.5–51.0)
Hemoglobin: 13.2 g/dL (ref 13.0–17.7)
MCH: 29 pg (ref 26.6–33.0)
MCHC: 33.2 g/dL (ref 31.5–35.7)
MCV: 87 fL (ref 79–97)
Platelets: 150 10*3/uL (ref 150–450)
RBC: 4.55 x10E6/uL (ref 4.14–5.80)
RDW: 13.3 % (ref 11.6–15.4)
WBC: 6.1 10*3/uL (ref 3.4–10.8)

## 2020-12-07 LAB — MAGNESIUM: Magnesium: 2.7 mg/dL — ABNORMAL HIGH (ref 1.6–2.3)

## 2020-12-07 LAB — TSH: TSH: 4.19 u[IU]/mL (ref 0.450–4.500)

## 2020-12-08 NOTE — Progress Notes (Signed)
Called and spoke with patient regarding his lab results, and chest xray results. Patient will contact [facility].

## 2020-12-11 ENCOUNTER — Encounter: Payer: Self-pay | Admitting: Cardiology

## 2020-12-11 NOTE — Progress Notes (Signed)
ADVANCED HF CLINIC CONSULT NOTE  Referring Physician: Dr. Einar Gip  Primary Care: [name], MD Primary Cardiologist: Adrian Prows EP: Caryl Comes HF: Dr. Haroldine Laws  HPI:  Miguel Patterson  is a 75 y.o. male from [place] with chronic systolic HF and ZOX0R (Creatinine 1.5-1.9) referred by Dr. Einar Gip to discuss candidacy for advanced HF therapies.   Mr. Brosh has a fairly limited PMHx. No previous h/o HTN or known CAD.   He first developed HF symptoms in 2016 and saw his PCP who referred him to Dr. Woody Seller and then Dr. Einar Gip.   He was referred to Dr. Caryl Comes in 2019 for ICD consideration. According to Dr. Olin Pia note from 10/14/17 initial echo in 2016 had EF 11% with anterior thinning. There were also frequent PVCs (~25% by ECG). At that time he did not have LBBB (IVCD). The question was raised about the possibility of Chagas disease or a genetic component. (he is 1 of 17 children and several family members on heart medications). He was referred for a cMRI.    Echo in 8/19 read as EF < 15% mod AI/MR (I do not have images to review).   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 Underwent BIV ICD CRT insertion on 02/28/2018. He does not recall ever having a cardiac cath.  We saw him in 11/21 and referred for genetic evaluation for LVNC. He was evaluated by Dr. 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  Overall he has done very well. He has followed with Dr. Einar Gip. Echo 10/21 EF 25-30%. Echo 03/22 EF < 20% with Dr. Einar Gip  Recently seen in Dr. Irven Shelling office for volume overload and torsemide increased to 40 bid with improvement.   He is here today for routine f/u. Says  over the past few months he has been "dying". Says breathing has been very bad but over the past 2 days it is perfect. Very fatigued. Can't even walk 50 feet any more. + abdominal bloating. + LE edema. No CP or dizziness.   Recent labs:  BNP 758 (7/22) -> 2,117 (11/21/20) -> 1,791 (12/02/20), Scr 1.8  Hgb 13.2 TSH ok CXR 12/06/20: ok    ICD interrogation: Optivol up. Impedance down but coming back to baseline. PVC burden has increased to ~35/hr with drop in effective BiV pacing to about 85%. No VT/AF. Personally reviewed   Echo 03/2020: LVEF < 20%, severe MR, moderate to severe TR, RVSP 52 mmhg  CPX 11/21  FVC 3.17 (71%)      FEV1 2.33 (70%)        FEV1/FVC 74 (97%)        MVV 94 (72%)        BP rest: 86/58 Standing BP: 92/60 BP peak: 122/60   Peak VO2: 26.9 (103% predicted peak VO2)  VE/VCO2 slope:  35  OUES: 2.17  Peak RER: 1.11   Ventilatory Threshold: 15.4 (59% predicted or measured peak VO2)   VE/MVV:  93%  O2pulse:  14   (100% predicted O2pulse)     Past Medical History:  Diagnosis Date   Cardiomegaly    Chronic combined systolic and diastolic heart failure ([facility]) 02/10/2018   DCM (dilated cardiomyopathy) ([facility])    Encounter for  adjustment of biventricular implantable cardioverter-defibrillator (ICD) 04/03/2019   Fatigue    ICD- Biventricular Medtronic ICD Claria Mri [id number] in situ 02/28/18 03/01/2018   LBBB (left bundle branch block)    Mild tricuspid regurgitation    Moderate aortic regurgitation    Moderate mitral regurgitation    Pericardial effusion    INSIGNIFICANT   S/P biventricular cardiac pacemaker procedure 02/28/18 with ICD MDT  03/01/2018   Short of breath on exertion    Systolic heart failure ([facility])    Trace pulmonic regurgitation by prior echocardiogram     Past Medical History:  Diagnosis Date   Cardiomegaly    Chronic combined systolic and diastolic heart failure ([facility]) 02/10/2018   DCM (dilated cardiomyopathy) ([facility])    Encounter for adjustment of  biventricular implantable cardioverter-defibrillator (ICD) 04/03/2019   Fatigue    ICD- Biventricular Medtronic ICD Claria Mri [id number] in situ 02/28/18 03/01/2018   LBBB (left bundle branch block)    Mild tricuspid regurgitation    Moderate aortic regurgitation    Moderate mitral regurgitation    Pericardial effusion    INSIGNIFICANT   S/P biventricular cardiac pacemaker procedure 02/28/18 with ICD MDT  03/01/2018   Short of breath on exertion    Systolic heart failure ([facility])    Trace pulmonic regurgitation by prior echocardiogram     Current Outpatient Medications  Medication Sig Dispense Refill   carvedilol (COREG) 3.125 MG tablet Take 1 tablet (3.125 mg total) by mouth 2 (two) times daily with a meal. 180 tablet 1   dapagliflozin propanediol (FARXIGA) 10 MG TABS tablet Take 1 tablet (10 mg total) by mouth daily before breakfast. 90 tablet 3   magnesium oxide (MAG-OX) 400 MG tablet Take 400 mg by mouth 2 (two) times daily.     torsemide (DEMADEX) 20 MG tablet Take 20 mg by mouth 2 (two) times daily.     No current facility-administered medications for this encounter.    Allergies  Allergen Reactions   Losartan Potassium Anaphylaxis    angioedema   Aspirin Other (See Comments)    Stomach pain       Social History   Socioeconomic History   Marital status: Divorced    Spouse name: Not on file   Number of children: 3   Years of education: Not on file   Highest education level: Not on file  Occupational History   Not on file  Tobacco Use   Smoking status: Never   Smokeless tobacco: Never  Vaping Use   Vaping Use: Never used  Substance and Sexual Activity   Alcohol use: No   Drug use: No   Sexual activity: Not on file  Other Topics Concern   Not on file  Social History Narrative   Not on file   Social Determinants of Health   Financial Resource Strain: Not on file  Food Insecurity: Not on file  Transportation Needs: Not on file  Physical Activity: Not on file   Stress: Not on file  Social Connections: Not on file  Intimate Partner Violence: Not on file      Family History  Problem Relation Age of Onset   Hypertension Mother 46   Thyroid disease Mother    Healthy Father 53    Vitals:   12/12/20 1402  BP: 92/60  Pulse: 64  SpO2: 94%  Weight: 79.9 kg (176 lb 3.2 oz)     PHYSICAL EXAM: General:  Weak appearing. No resp difficulty HEENT: normal Neck: supple. JVP 10-12 with  prominent v-waves Carotids 2+ bilat; no bruits. No lymphadenopathy or thryomegaly appreciated. Cor: PMI nondisplaced. Irregular rate & rhythm. No rubs, gallops or murmurs. Lungs: clear Abdomen: soft, nontender, nondistended. No hepatosplenomegaly. No bruits or masses. Good bowel sounds. Extremities: no cyanosis, clubbing, rash, 3+ edema R 2+ left (warm) Neuro: alert & orientedx3, cranial nerves grossly intact. moves all 4 extremities w/o difficulty. Affect pleasant   ECG: AF 77 with v pacing and PVCs. Personally reviewed  ASSESSMENT & PLAN:  1. Acute on Chronic systolic HF due to non-compaction CM - cMRI 11/19  LVEF 14% severely dilated LV with normal wall thickness c/w end stage LVNC  - No report of cardiac cath but based on MRI (and family history), LVNC seems like it may be the culprit here. He also has a h/o LBBB but his CM predated the development of LBBB so doubt it can be implicated. Previously also has had frequent PVCs but not present today - Echo 10/21 EF 25-30% with Dr. Einar Gip  - Echo 03/22 EF < 20% with Dr. Einar Gip - s/p MDT CRT-D - CPX 11/21 pVO2 26.9 (103%) Slope 35 - Much worse today NYHA IIIB-IV in setting of recent onset AF - ICD interrogation done personally today in clinic: Optivol up. Impedance down but coming back to baseline. PVC burden has increased to ~35/hr with drop in effective BiV pacing to about 85%. No VT/AF. Personally reviewed - Continue torsemide at increased dose of 80 daily - Give metolazone 2.5 + kcl 40 x 2 days - Off losartan  due to angioedema - Continue carvedilol 3.125 bid - Continue Farxiga 10  - Failed spiro due to AKI - He has advanced non-compaction cardiomyopathy with EF <20 and severe MR, mod-sev TR. However CPX 11/21 with normal functional capacity.  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 NYHA IIIB-IV. Still with some volume overload but symptoms seem out of proportion to volume. May be related to increase in PVCs and decrease in effective BiV pacing however PVC increase has only been modest.  - I d/w Dr. Einar Gip, if not improved with metolazone will plan RHC and possible addition of mexilitene to suppress PVCs.   2. Valvular heart disease - Mod AI/Mod MR on cMRI - Severe MR, mild to moderate AI and moderate to severe TR on echo 03/22 - likely functional.   3. CKD3b - Baseline SCr 1.8 - Continue Farxiga - Labs today  4. LBBB - s/p CRT-D  Total time spent 50 minutes. Over half that time spent discussing above.   Glori Bickers, MD  2:43 PM

## 2020-12-11 NOTE — H&P (View-Only) (Signed)
ADVANCED HF CLINIC CONSULT NOTE  Referring Physician: Dr. Einar Patterson  Primary Care: Miguel Sacramento, MD Primary Cardiologist: Miguel Patterson EP: Miguel Patterson HF: Miguel Patterson  HPI:  Miguel Patterson  is a 75 y.o. male from [place] with chronic systolic HF and YJE5U (Creatinine 1.5-1.9) referred by Dr. Einar Patterson to discuss candidacy for advanced HF therapies.   Miguel Patterson has a fairly limited PMHx. No previous h/o HTN or known CAD.   He first developed HF symptoms in 2016 and saw his PCP who referred him to Dr. Woody Patterson and then Dr. Einar Patterson.   He was referred to Dr. Caryl Patterson in 2019 for ICD consideration. According to Dr. Olin Patterson note from 10/14/17 initial echo in 2016 had EF 11% with anterior thinning. There were also frequent PVCs (~25% by ECG). At that time he did not have LBBB (IVCD). The question was raised about the possibility of Chag[place] disease or a genetic component. (he is 1 of 17 children and several family members on heart medications). He was referred for a cMRI.    Echo in 8/19 read as EF < 15% mod AI/MR (I do not have images to review).   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 Underwent BIV ICD CRT insertion on 02/28/2018. He does not recall ever having a cardiac cath.  We saw him in 11/21 and referred for genetic evaluation for LVNC. He was evaluated by Dr. 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  Overall he has done very well. He has followed with Dr. Einar Patterson. Echo 10/21 EF 25-30%. Echo 03/22 EF < 20% with Dr. Einar Patterson  Recently seen in Dr. Irven Patterson office for volume overload and torsemide increased to 40 bid with improvement.   He is here today for routine f/u. Says  over the past few months he has been "dying". Says breathing has been very bad but over the past 2 days it is perfect. Very fatigued. Can't even walk 50 feet any more. + abdominal bloating. + LE edema. No CP or dizziness.   Recent labs:  BNP 758 (7/22) -> 2,117 (11/21/20) -> 1,791 (12/02/20), Scr 1.8  Hgb 13.2 TSH ok CXR 12/06/20: ok    ICD interrogation: Optivol up. Impedance down but coming back to baseline. PVC burden has increased to ~35/hr with drop in effective BiV pacing to about 85%. No VT/AF. Personally reviewed   Echo 03/2020: LVEF < 20%, severe MR, moderate to severe TR, RVSP 52 mmhg  CPX 11/21  FVC 3.17 (71%)      FEV1 2.33 (70%)        FEV1/FVC 74 (97%)        MVV 94 (72%)        BP rest: 86/58 Standing BP: 92/60 BP peak: 122/60   Peak VO2: 26.9 (103% predicted peak VO2)  VE/VCO2 slope:  35  OUES: 2.17  Peak RER: 1.11   Ventilatory Threshold: 15.4 (59% predicted or measured peak VO2)   VE/MVV:  93%  O2pulse:  14   (100% predicted O2pulse)     Past Medical History:  Diagnosis Date   Cardiomegaly    Chronic combined systolic and diastolic heart failure ([place]) 02/10/2018   DCM (dilated cardiomyopathy) ([place])    Encounter for  adjustment of biventricular implantable cardioverter-defibrillator (ICD) 04/03/2019   Fatigue    ICD- Biventricular Medtronic ICD Claria Mri [id number] in situ 02/28/18 03/01/2018   LBBB (left bundle branch block)    Mild tricuspid regurgitation    Moderate aortic regurgitation    Moderate mitral regurgitation    Pericardial effusion    INSIGNIFICANT   S/P biventricular cardiac pacemaker procedure 02/28/18 with ICD MDT  03/01/2018   Short of breath on exertion    Systolic heart failure (HCC)    Trace pulmonic regurgitation by prior echocardiogram     Past Medical History:  Diagnosis Date   Cardiomegaly    Chronic combined systolic and diastolic heart failure ([place]) 02/10/2018   DCM (dilated cardiomyopathy) ([place])    Encounter for adjustment of  biventricular implantable cardioverter-defibrillator (ICD) 04/03/2019   Fatigue    ICD- Biventricular Medtronic ICD Claria Mri [id number] in situ 02/28/18 03/01/2018   LBBB (left bundle branch block)    Mild tricuspid regurgitation    Moderate aortic regurgitation    Moderate mitral regurgitation    Pericardial effusion    INSIGNIFICANT   S/P biventricular cardiac pacemaker procedure 02/28/18 with ICD MDT  03/01/2018   Short of breath on exertion    Systolic heart failure (HCC)    Trace pulmonic regurgitation by prior echocardiogram     Current Outpatient Medications  Medication Sig Dispense Refill   carvedilol (COREG) 3.125 MG tablet Take 1 tablet (3.125 mg total) by mouth 2 (two) times daily with a meal. 180 tablet 1   dapagliflozin propanediol (FARXIGA) 10 MG TABS tablet Take 1 tablet (10 mg total) by mouth daily before breakfast. 90 tablet 3   magnesium oxide (MAG-OX) 400 MG tablet Take 400 mg by mouth 2 (two) times daily.     torsemide (DEMADEX) 20 MG tablet Take 20 mg by mouth 2 (two) times daily.     No current facility-administered medications for this encounter.    Allergies  Allergen Reactions   Losartan Potassium Anaphylaxis    angioedema   Aspirin Other (See Comments)    Stomach pain       Social History   Socioeconomic History   Marital status: Divorced    Spouse name: Not on file   Number of children: 3   Years of education: Not on file   Highest education level: Not on file  Occupational History   Not on file  Tobacco Use   Smoking status: Never   Smokeless tobacco: Never  Vaping Use   Vaping Use: Never used  Substance and Sexual Activity   Alcohol use: No   Drug use: No   Sexual activity: Not on file  Other Topics Concern   Not on file  Social History Narrative   Not on file   Social Determinants of Health   Financial Resource Strain: Not on file  Food Insecurity: Not on file  Transportation Needs: Not on file  Physical Activity: Not on file   Stress: Not on file  Social Connections: Not on file  Intimate Partner Violence: Not on file      Family History  Problem Relation Age of Onset   Hypertension Mother 84   Thyroid disease Mother    Healthy Father 38    Vitals:   12/12/20 1402  BP: 92/60  Pulse: 64  SpO2: 94%  Weight: 79.9 kg (176 lb 3.2 oz)     PHYSICAL EXAM: General:  Weak appearing. No resp difficulty HEENT: normal Neck: supple. JVP 10-12 with  prominent v-waves Carotids 2+ bilat; no bruits. No lymphadenopathy or thryomegaly appreciated. Cor: PMI nondisplaced. Irregular rate & rhythm. No rubs, gallops or murmurs. Lungs: clear Abdomen: soft, nontender, nondistended. No hepatosplenomegaly. No bruits or masses. Good bowel sounds. Extremities: no cyanosis, clubbing, rash, 3+ edema R 2+ left (warm) Neuro: alert & orientedx3, cranial nerves grossly intact. moves all 4 extremities w/o difficulty. Affect pleasant   ECG: AF 77 with v pacing and PVCs. Personally reviewed  ASSESSMENT & PLAN:  1. Acute on Chronic systolic HF due to non-compaction CM - cMRI 11/19  LVEF 14% severely dilated LV with normal wall thickness c/w end stage LVNC  - No report of cardiac cath but based on MRI (and family history), LVNC seems like it may be the culprit here. He also has a h/o LBBB but his CM predated the development of LBBB so doubt it can be implicated. Previously also has had frequent PVCs but not present today - Echo 10/21 EF 25-30% with Dr. Einar Patterson  - Echo 03/22 EF < 20% with Dr. Einar Patterson - s/p MDT CRT-D - CPX 11/21 pVO2 26.9 (103%) Slope 35 - Much worse today NYHA IIIB-IV in setting of recent onset AF - ICD interrogation done personally today in clinic: Optivol up. Impedance down but coming back to baseline. PVC burden has increased to ~35/hr with drop in effective BiV pacing to about 85%. No VT/AF. Personally reviewed - Continue torsemide at increased dose of 80 daily - Give metolazone 2.5 + kcl 40 x 2 days - Off losartan  due to angioedema - Continue carvedilol 3.125 bid - Continue Farxiga 10  - Failed spiro due to AKI - He has advanced non-compaction cardiomyopathy with EF <20 and severe MR, mod-sev TR. However CPX 11/21 with normal functional capacity.  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 NYHA IIIB-IV. Still with some volume overload but symptoms seem out of proportion to volume. May be related to increase in PVCs and decrease in effective BiV pacing however PVC increase has only been modest.  - I d/w Dr. Einar Patterson, if not improved with metolazone will plan RHC and possible addition of mexilitene to suppress PVCs.   2. Valvular heart disease - Mod AI/Mod MR on cMRI - Severe MR, mild to moderate AI and moderate to severe TR on echo 03/22 - likely functional.   3. CKD3b - Baseline SCr 1.8 - Continue Farxiga - Labs today  4. LBBB - s/p CRT-D  Total time spent 50 minutes. Over half that time spent discussing above.   Glori Bickers, MD  2:43 PM

## 2020-12-12 ENCOUNTER — Other Ambulatory Visit: Payer: Self-pay

## 2020-12-12 ENCOUNTER — Ambulatory Visit (HOSPITAL_COMMUNITY)
Admission: RE | Admit: 2020-12-12 | Discharge: 2020-12-12 | Disposition: A | Discharge status: Home / Self Care | Payer: Medicare Other | Source: Ambulatory Visit | Attending: Internal Medicine | Admitting: Internal Medicine

## 2020-12-12 ENCOUNTER — Encounter (HOSPITAL_COMMUNITY): Payer: Self-pay | Admitting: Internal Medicine

## 2020-12-12 VITALS — BP 92/60 | HR 64 | Wt 176.2 lb

## 2020-12-12 DIAGNOSIS — I082 Rheumatic disorders of both aortic and tricuspid valves: Secondary | ICD-10-CM | POA: Diagnosis not present

## 2020-12-12 DIAGNOSIS — Z7901 Long term (current) use of anticoagulants: Secondary | ICD-10-CM | POA: Insufficient documentation

## 2020-12-12 DIAGNOSIS — R609 Edema, unspecified: Secondary | ICD-10-CM | POA: Diagnosis not present

## 2020-12-12 DIAGNOSIS — I5042 Chronic combined systolic (congestive) and diastolic (congestive) heart failure: Secondary | ICD-10-CM | POA: Diagnosis not present

## 2020-12-12 DIAGNOSIS — R5383 Other fatigue: Secondary | ICD-10-CM | POA: Insufficient documentation

## 2020-12-12 DIAGNOSIS — R140 Abdominal distension (gaseous): Secondary | ICD-10-CM | POA: Insufficient documentation

## 2020-12-12 DIAGNOSIS — Z79899 Other long term (current) drug therapy: Secondary | ICD-10-CM | POA: Diagnosis not present

## 2020-12-12 DIAGNOSIS — I5023 Acute on chronic systolic (congestive) heart failure: Secondary | ICD-10-CM | POA: Diagnosis not present

## 2020-12-12 DIAGNOSIS — N1831 Chronic kidney disease, stage 3a: Secondary | ICD-10-CM

## 2020-12-12 DIAGNOSIS — I493 Ventricular premature depolarization: Secondary | ICD-10-CM | POA: Diagnosis not present

## 2020-12-12 DIAGNOSIS — Z7984 Long term (current) use of oral hypoglycemic drugs: Secondary | ICD-10-CM | POA: Insufficient documentation

## 2020-12-12 DIAGNOSIS — N1832 Chronic kidney disease, stage 3b: Secondary | ICD-10-CM | POA: Insufficient documentation

## 2020-12-12 DIAGNOSIS — I447 Left bundle-branch block, unspecified: Secondary | ICD-10-CM | POA: Insufficient documentation

## 2020-12-12 DIAGNOSIS — Z95810 Presence of automatic (implantable) cardiac defibrillator: Secondary | ICD-10-CM

## 2020-12-12 MED ORDER — METOLAZONE 2.5 MG PO TABS: 2.5000 mg | ORAL_TABLET | Freq: Every day | ORAL | 0 refills | Status: DC

## 2020-12-12 MED ORDER — POTASSIUM CHLORIDE CRYS ER 20 MEQ PO TBCR: 40.0000 meq | EXTENDED_RELEASE_TABLET | Freq: Every day | ORAL | 0 refills | Status: DC

## 2020-12-12 NOTE — Progress Notes (Signed)
ReDS Vest / Clip - 12/12/20 1500       ReDS Vest / Clip   Station Marker C    Ruler Value 28    ReDS Value Range Moderate volume overload    ReDS Actual Value 37

## 2020-12-12 NOTE — Patient Instructions (Signed)
Medication Changes:  Take 2.5 mg Metolazone Daily for 2 days  Take Potassium 40Meq (2 TAB) Daily for 2 days  Lab Work:  none  Testing/Procedures:    Referrals:    Special Instructions // Education:    Follow-Up in: 4 Months (April 2023) ** Call office in March for your appointment**  At the [facility], you and your health needs are our priority. We have a designated team specialized in the treatment of Heart Failure. This Care Team includes your primary Heart Failure Specialized Cardiologist (physician), Advanced Practice Providers (APPs- Physician Assistants and Nurse Practitioners), and Pharmacist who all work together to provide you with the care you need, when you need it.   You may see any of the following providers on your designated Care Team at your next follow up:  Dr Glori Bickers Dr Haynes Kerns, NP Lyda Jester, [place] [facility] [place], [place] Audry Riles, PharmD   Please be sure to bring in all your medications bottles to every appointment.   Need to Contact us:  If you have any questions or concerns before your next appointment please send [place] a message through [place] or call our office at [phone].    TO LEAVE A MESSAGE FOR THE NURSE SELECT OPTION 2, PLEASE LEAVE A MESSAGE INCLUDING: YOUR NAME DATE OF BIRTH CALL BACK NUMBER REASON FOR CALL**this is important as we prioritize the call backs  YOU WILL RECEIVE A CALL BACK THE SAME DAY AS LONG AS YOU CALL BEFORE 4:00 PM

## 2020-12-13 ENCOUNTER — Other Ambulatory Visit (HOSPITAL_COMMUNITY): Payer: Self-pay | Admitting: *Deleted

## 2020-12-13 ENCOUNTER — Telehealth (HOSPITAL_COMMUNITY): Payer: Self-pay | Admitting: *Deleted

## 2020-12-13 DIAGNOSIS — I5022 Chronic systolic (congestive) heart failure: Secondary | ICD-10-CM

## 2020-12-13 NOTE — Telephone Encounter (Signed)
Per Dr Haroldine Laws pt needs RHC tomorrow, sch for 12/14 at 11:30  Called and spoke w/pt he is aware and agreeable, all instructions reviewed with him and all questions were answered

## 2020-12-14 ENCOUNTER — Ambulatory Visit (HOSPITAL_COMMUNITY)
Admission: RE | Admit: 2020-12-14 | Discharge: 2020-12-14 | Disposition: A | Discharge status: Home / Self Care | Payer: Medicare Other | Attending: Internal Medicine | Admitting: Internal Medicine

## 2020-12-14 ENCOUNTER — Encounter (HOSPITAL_COMMUNITY)
Admission: RE | Disposition: A | Discharge status: Home / Self Care | Payer: Self-pay | Source: Home / Self Care | Attending: Internal Medicine

## 2020-12-14 DIAGNOSIS — I5043 Acute on chronic combined systolic (congestive) and diastolic (congestive) heart failure: Secondary | ICD-10-CM | POA: Insufficient documentation

## 2020-12-14 DIAGNOSIS — I082 Rheumatic disorders of both aortic and tricuspid valves: Secondary | ICD-10-CM | POA: Insufficient documentation

## 2020-12-14 DIAGNOSIS — I5023 Acute on chronic systolic (congestive) heart failure: Secondary | ICD-10-CM

## 2020-12-14 DIAGNOSIS — N1832 Chronic kidney disease, stage 3b: Secondary | ICD-10-CM | POA: Insufficient documentation

## 2020-12-14 DIAGNOSIS — Z79899 Other long term (current) drug therapy: Secondary | ICD-10-CM | POA: Insufficient documentation

## 2020-12-14 DIAGNOSIS — N179 Acute kidney failure, unspecified: Secondary | ICD-10-CM | POA: Insufficient documentation

## 2020-12-14 DIAGNOSIS — I447 Left bundle-branch block, unspecified: Secondary | ICD-10-CM | POA: Diagnosis not present

## 2020-12-14 DIAGNOSIS — I5022 Chronic systolic (congestive) heart failure: Secondary | ICD-10-CM

## 2020-12-14 HISTORY — PX: RIGHT HEART CATH: CATH118263

## 2020-12-14 LAB — POCT I-STAT EG7
Acid-Base Excess: 9 mmol/L — ABNORMAL HIGH (ref 0.0–2.0)
Acid-Base Excess: 9 mmol/L — ABNORMAL HIGH (ref 0.0–2.0)
Bicarbonate: 30.9 mmol/L — ABNORMAL HIGH (ref 20.0–28.0)
Bicarbonate: 31.1 mmol/L — ABNORMAL HIGH (ref 20.0–28.0)
Calcium, Ion: 1.07 mmol/L — ABNORMAL LOW (ref 1.15–1.40)
Calcium, Ion: 1.1 mmol/L — ABNORMAL LOW (ref 1.15–1.40)
HCT: 42 % (ref 39.0–52.0)
HCT: 43 % (ref 39.0–52.0)
Hemoglobin: 14.3 g/dL (ref 13.0–17.0)
Hemoglobin: 14.6 g/dL (ref 13.0–17.0)
O2 Saturation: 65 %
O2 Saturation: 62 %
Potassium: 2.8 mmol/L — ABNORMAL LOW (ref 3.5–5.1)
Potassium: 2.8 mmol/L — ABNORMAL LOW (ref 3.5–5.1)
Sodium: 140 mmol/L (ref 135–145)
Sodium: 140 mmol/L (ref 135–145)
TCO2: 32 mmol/L (ref 22–32)
TCO2: 32 mmol/L (ref 22–32)
pCO2, Ven: 34.4 mmHg — ABNORMAL LOW (ref 44.0–60.0)
pCO2, Ven: 34.7 mmHg — ABNORMAL LOW (ref 44.0–60.0)
pH, Ven: 7.562 — ABNORMAL HIGH (ref 7.250–7.430)
pH, Ven: 7.56 — ABNORMAL HIGH (ref 7.250–7.430)
pO2, Ven: 29 mmHg — CL (ref 32.0–45.0)
pO2, Ven: 27 mmHg — CL (ref 32.0–45.0)

## 2020-12-14 LAB — BASIC METABOLIC PANEL
Anion gap: 14 (ref 5–15)
BUN: 69 mg/dL — ABNORMAL HIGH (ref 8–23)
CO2: 28 mmol/L (ref 22–32)
Calcium: 9.1 mg/dL (ref 8.9–10.3)
Chloride: 97 mmol/L — ABNORMAL LOW (ref 98–111)
Creatinine, Ser: 1.98 mg/dL — ABNORMAL HIGH (ref 0.61–1.24)
GFR, Estimated: 35 mL/min — ABNORMAL LOW (ref >60)
Glucose, Bld: 106 mg/dL — ABNORMAL HIGH (ref 70–99)
Potassium: 2.8 mmol/L — ABNORMAL LOW (ref 3.5–5.1)
Sodium: 139 mmol/L (ref 135–145)

## 2020-12-14 SURGERY — RIGHT HEART CATH
Anesthesia: LOCAL

## 2020-12-14 MED ORDER — ACETAMINOPHEN 325 MG PO TABS: 650.0000 mg | ORAL_TABLET | ORAL | Status: DC | PRN

## 2020-12-14 MED ORDER — SODIUM CHLORIDE 0.9 % IV SOLN: 250.0000 mL | INTRAVENOUS | Status: DC | PRN

## 2020-12-14 MED ORDER — ASPIRIN 81 MG PO CHEW: 81.0000 mg | CHEWABLE_TABLET | ORAL | Status: DC

## 2020-12-14 MED ORDER — HEPARIN (PORCINE) IN NACL 1000-0.9 UT/500ML-% IV SOLN
INTRAVENOUS | Status: DC | PRN
  Administered 2020-12-14: 500 mL

## 2020-12-14 MED ORDER — HEPARIN (PORCINE) IN NACL 1000-0.9 UT/500ML-% IV SOLN
INTRAVENOUS | Status: AC
  Filled 2020-12-14: qty 1000

## 2020-12-14 MED ORDER — SODIUM CHLORIDE 0.9% FLUSH: 3.0000 mL | Freq: Two times a day (BID) | INTRAVENOUS | Status: DC

## 2020-12-14 MED ORDER — HYDRALAZINE HCL 20 MG/ML IJ SOLN: 10.0000 mg | INTRAMUSCULAR | Status: DC | PRN

## 2020-12-14 MED ORDER — SODIUM CHLORIDE 0.9% FLUSH: 3.0000 mL | INTRAVENOUS | Status: DC | PRN

## 2020-12-14 MED ORDER — LIDOCAINE HCL (PF) 1 % IJ SOLN
INTRAMUSCULAR | Status: DC | PRN
  Administered 2020-12-14: 1 mL

## 2020-12-14 MED ORDER — SODIUM CHLORIDE 0.9 % IV SOLN: INTRAVENOUS | Status: DC

## 2020-12-14 MED ORDER — ONDANSETRON HCL 4 MG/2ML IJ SOLN: 4.0000 mg | Freq: Four times a day (QID) | INTRAMUSCULAR | Status: DC | PRN

## 2020-12-14 MED ORDER — MEXILETINE HCL 200 MG PO CAPS: 200.0000 mg | ORAL_CAPSULE | Freq: Two times a day (BID) | ORAL | 2 refills | Status: DC

## 2020-12-14 MED ORDER — LABETALOL HCL 5 MG/ML IV SOLN: 10.0000 mg | INTRAVENOUS | Status: DC | PRN

## 2020-12-14 MED ORDER — LIDOCAINE HCL (PF) 1 % IJ SOLN
INTRAMUSCULAR | Status: AC
  Filled 2020-12-14: qty 30

## 2020-12-14 SURGICAL SUPPLY — 9 items
CATH BALLN WEDGE 5F 110CM (CATHETERS) ×1 IMPLANT
PACK CARDIAC CATHETERIZATION (CUSTOM PROCEDURE TRAY) ×2 IMPLANT
PROTECTION STATION PRESSURIZED (MISCELLANEOUS) ×2
SHEATH GLIDE SLENDER 4/5FR (SHEATH) ×1 IMPLANT
STATION PROTECTION PRESSURIZED (MISCELLANEOUS) IMPLANT
TRANSDUCER W/STOPCOCK (MISCELLANEOUS) ×2 IMPLANT
TUBING ART PRESS 72  MALE/FEM (TUBING) ×2
TUBING ART PRESS 72 MALE/FEM (TUBING) IMPLANT
WIRE EMERALD 3MM-J .025X260CM (WIRE) ×1 IMPLANT

## 2020-12-14 NOTE — Interval H&P Note (Signed)
History and Physical Interval Note:  30/16/0109 32:35 PM  Miguel Patterson  has presented today for surgery, with the diagnosis of heart failure.  The various methods of treatment have been discussed with the patient and family. After consideration of risks, benefits and other options for treatment, the patient has consented to  Procedure(s): RIGHT HEART CATH (N/A) as a surgical intervention.  The patient's history has been reviewed, patient examined, no change in status, stable for surgery.  I have reviewed the patient's chart and labs.  Questions were answered to the patient's satisfaction.     Merrel Crabbe

## 2020-12-14 NOTE — Discharge Instructions (Signed)
° °  Brachial Site Care   This sheet gives you information about how to care for yourself after your procedure. Your health care provider may also give you more specific instructions. If you have problems or questions, contact your health care provider. What can I expect after the procedure? After the procedure, it is common to have: Bruising and tenderness at the catheter insertion area. Follow these instructions at home: Medicines Take over-the-counter and prescription medicines only as told by your health care provider. Insertion site care Follow instructions from your health care provider about how to take care of your insertion site. Make sure you: Wash your hands with soap and water before you change your bandage (dressing). If soap and water are not available, use hand sanitizer. Remove your dressing as told by your health care provider. In 24 hours Check your insertion site every day for signs of infection. Check for: Redness, swelling, or pain. Fluid or blood. Pus or a bad smell. Warmth. Do not take baths, swim, or use a hot tub until your health care provider approves. You may shower 24-48 hours after the procedure, or as directed by your health care provider. Remove the dressing and gently wash the site with plain soap and water. Pat the area dry with a clean towel. Do not rub the site. That could cause bleeding. Do not apply powder or lotion to the site. Activity   For 24 hours after the procedure, or as directed by your health care provider: Do not flex or bend the affected arm. Do not push or pull heavy objects with the affected arm. Do not drive yourself home from the hospital or clinic. You may drive 24 hours after the procedure unless your health care provider tells you not to. Do not operate machinery or power tools. Do not lift anything that is heavier than 10 lb (4.5 kg), or the limit that you are told, until your health care provider says that it is safe.  For 2  days Ask your health care provider when it is okay to: Return to work or school. Resume usual physical activities or sports. Resume sexual activity. General instructions If the catheter site starts to bleed, hold pressure for 5-10 minutes. If you went home on the same day as your procedure, a responsible adult should be with you for the first 24 hours after you arrive home. Keep all follow-up visits as told by your health care provider. This is important. Contact a health care provider if: You have a fever. You have redness, swelling, or yellow drainage around your insertion site.  These symptoms may represent a serious problem that is an emergency. Do not wait to see if the symptoms will go away. Get medical help right away. Call your local emergency services (911 in the [place] Do not drive yourself to the hospital. Summary After the procedure, it is common to have bruising and tenderness at the site. Follow instructions from your health care provider about how to take care of your brachial site wound. Check the wound every day for signs of infection. This information is not intended to replace advice given to you by your health care provider. Make sure you discuss any questions you have with your health care provider. Document Revised: 01/23/2017 Document Reviewed: 01/23/2017 Elsevier Patient Education  2020 [employer].

## 2020-12-15 ENCOUNTER — Encounter (HOSPITAL_COMMUNITY): Payer: Self-pay | Admitting: Internal Medicine

## 2021-01-09 ENCOUNTER — Telehealth: Payer: Self-pay

## 2021-01-09 NOTE — Telephone Encounter (Signed)
Patient called Miguel Patterson stating that he has got shortness of breath especially when he lays down and only lasts for a few seconds and then he is able to lay down through the night.  He has been sleeping flat.  Recently diagnosed with severe GERD as well.  States that still having GERD symptoms, most importantly is having difficulty eating and states that he feels full very quickly and is also not been able to drink much liquids as well.  He is severely constipated as well.  All the symptoms are new.  Advised him to hold [place] for now to see whether his symptoms will improve.  Advised him to lay down with 2 pillows propped up to see if his symptoms would improve.  Patient has an appointment to see me on 01/18/2021, asked him whether he should be seen sooner, patient states that he is able to wait as his symptoms are ongoing for the past >22-month overall.   Adrian Prows, MD, [facility] 01/09/2021, 5:19 PM Office: [phone] Fax: [phone] Pager: [phone]   Medications Discontinued During This Encounter  Medication Reason   dapagliflozin propanediol (FARXIGA) 10 MG TABS tablet Discontinued by provider

## 2021-01-16 ENCOUNTER — Ambulatory Visit: Payer: Medicare Other | Admitting: Cardiology

## 2021-01-18 ENCOUNTER — Encounter: Payer: Self-pay | Admitting: Cardiology

## 2021-01-18 ENCOUNTER — Other Ambulatory Visit: Payer: Self-pay

## 2021-01-18 ENCOUNTER — Ambulatory Visit: Payer: Medicare Other | Admitting: Cardiology

## 2021-01-18 VITALS — BP 100/68 | HR 64 | Ht 72.0 in | Wt 170.0 lb

## 2021-01-18 DIAGNOSIS — I493 Ventricular premature depolarization: Secondary | ICD-10-CM

## 2021-01-18 DIAGNOSIS — I5022 Chronic systolic (congestive) heart failure: Secondary | ICD-10-CM

## 2021-01-18 DIAGNOSIS — K219 Gastro-esophageal reflux disease without esophagitis: Secondary | ICD-10-CM

## 2021-01-18 DIAGNOSIS — Z95810 Presence of automatic (implantable) cardiac defibrillator: Secondary | ICD-10-CM

## 2021-01-18 DIAGNOSIS — Z4502 Encounter for adjustment and management of automatic implantable cardiac defibrillator: Secondary | ICD-10-CM

## 2021-01-18 DIAGNOSIS — I428 Other cardiomyopathies: Secondary | ICD-10-CM

## 2021-01-18 MED ORDER — DIGOXIN 125 MCG PO TABS: 0.1250 mg | ORAL_TABLET | Freq: Every day | ORAL | 2 refills | Status: DC

## 2021-01-18 MED ORDER — POTASSIUM CHLORIDE CRYS ER 20 MEQ PO TBCR: 40.0000 meq | EXTENDED_RELEASE_TABLET | Freq: Every day | ORAL | 0 refills | Status: DC | PRN

## 2021-01-18 MED ORDER — OMEPRAZOLE 20 MG PO CPDR: 20.0000 mg | DELAYED_RELEASE_CAPSULE | Freq: Every day | ORAL | 2 refills | Status: DC

## 2021-01-18 MED ORDER — METOLAZONE 2.5 MG PO TABS: 2.5000 mg | ORAL_TABLET | Freq: Every day | ORAL | 2 refills | Status: DC | PRN

## 2021-01-18 NOTE — Progress Notes (Signed)
Primary Physician:  Nickola Major, MD   Patient ID: Miguel Patterson, male    DOB: Jul 10, 1945, 76 y.o.   MRN: [id number]   Chief Complaint  Patient presents with   Fatigue   HPI    Miguel Patterson  is a 76 y.o. 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 Underwent BIV ICD CRT insertion on 02/28/2018 with Dr. Caryl Comes for nonischemic cardiomyopathy.   Patient is also being followed by Dr[facility]ine [facility], underwent right heart catheterization.  Miguel Patterson was also started on mexiletine for frequent PVCs after discussions between [place].  Miguel Patterson has not been able to tolerate any GDT due to low blood pressure.  Presently on low-dose Coreg.  For the past 2 to 3 months his activity level has decreased markedly, has had significant leg edema and marked dyspnea, was also admitted for short stay in the hospital for diuresis and also underwent right heart catheterization by Dr. Haroldine Laws.  Miguel Patterson continues to complain of marked fatigue and exercise intolerance and dyspnea.  Miguel Patterson has CAD continued leg edema.  Occasional episodes of orthopnea.  No chest pain, palpitations, syncope.  Past Medical History:  Diagnosis Date   Cardiomegaly    Chronic combined systolic and diastolic heart failure ([place]) 02/10/2018   DCM (dilated cardiomyopathy) ([place])    Encounter for adjustment of biventricular implantable cardioverter-defibrillator (ICD) 04/03/2019   Fatigue    ICD- Biventricular Medtronic ICD Claria Mri [id number] in situ 02/28/18 03/01/2018   LBBB (left bundle branch block)    Mild tricuspid regurgitation    Moderate aortic regurgitation    Moderate mitral regurgitation    Pericardial effusion    INSIGNIFICANT   S/P biventricular cardiac  pacemaker procedure 02/28/18 with ICD MDT  03/01/2018   Short of breath on exertion    Systolic heart failure (HCC)    Trace pulmonic regurgitation by prior echocardiogram    Past Surgical History:  Procedure Laterality Date   BIV ICD INSERTION CRT-D N/A 02/28/2018   Procedure: BIV ICD INSERTION CRT-D;  Surgeon: Deboraha Sprang, MD;  Location: [facility];  Service: Cardiovascular;  Laterality: N/A;   LEG SURGERY Left 2000   DUE TO BROKE LEG   RIGHT HEART CATH N/A 12/14/2020   Procedure: RIGHT HEART CATH;  Surgeon: Jolaine Artist, MD;  Location: [facility];  Service: Cardiovascular;  Laterality: N/A;   Family History  Problem Relation Age of Onset   Hypertension Mother 27   Thyroid disease Mother    Healthy Father 82   Social History   Tobacco Use   Smoking status: Never   Smokeless tobacco: Never  Substance Use Topics   Alcohol use: No   Marital Status: Divorced  ROS:   Review of Systems  Constitutional: Positive for weight loss (additional 8 lbs since last 3 months).  Cardiovascular:  Positive for dyspnea on exertion and leg swelling. Negative for chest pain, orthopnea and paroxysmal nocturnal dyspnea.  Gastrointestinal:  Positive for heartburn.   Objective:  Blood pressure 100/68, pulse 64, height 6' (1.829 m), weight 170 lb (77.1 kg), SpO2 97 %. Body mass index is 23.06 kg/m.  Vitals with BMI 01/18/2021 01/18/2021 12/14/2020  Height - 6\' 0"  -  Weight - 170 lbs -  BMI -  95.18 -  Systolic 841 97 660  Diastolic 68 69 65  Pulse 64 59 68     Physical Exam Vitals reviewed.  Constitutional:      Appearance: Miguel Patterson is well-developed.  Neck:     Vascular: JVD present. No carotid bruit.  Cardiovascular:     Rate and Rhythm: Normal rate and regular rhythm.     Pulses: Normal pulses and intact distal pulses.     Heart sounds: S1 normal and S2 normal. Murmur heard.  High-pitched blowing holosystolic murmur is present with a grade of 3/6 at the apex.    No  gallop.  Pulmonary:     Effort: Pulmonary effort is normal. No accessory muscle usage or respiratory distress.     Breath sounds: Rales (bibasilar) present. No wheezing or rhonchi.  Musculoskeletal:     Right lower leg: Edema (2+ pitting) present.     Left lower leg: Edema (2+ pitting, right worse than left) present.  Neurological:     Mental Status: Miguel Patterson is alert.   Laboratory examination:   CMP Latest Ref Rng & Units 12/14/2020 12/14/2020 12/14/2020  Glucose 70 - 99 mg/dL 106(H) - -  BUN 8 - 23 mg/dL 69(H) - -  Creatinine 0.61 - 1.24 mg/dL 1.98(H) - -  Sodium 135 - 145 mmol/L 139 140 140  Potassium 3.5 - 5.1 mmol/L 2.8(L) 2.8(L) 2.8(L)  Chloride 98 - 111 mmol/L 97(L) - -  CO2 22 - 32 mmol/L 28 - -  Calcium 8.9 - 10.3 mg/dL 9.1 - -  Total Protein 6.5 - 8.1 g/dL - - -  Total Bilirubin 0.3 - 1.2 mg/dL - - -  Alkaline Phos 38 - 126 U/L - - -  AST 15 - 41 U/L - - -  ALT 0 - 44 U/L - - -   CBC Latest Ref Rng & Units 12/14/2020 12/14/2020 12/06/2020  WBC 3.4 - 10.8 x10E3/uL - - 6.1  Hemoglobin 13.0 - 17.0 g/dL 14.6 14.3 13.2  Hematocrit 39.0 - 52.0 % 43.0 42.0 39.7  Platelets 150 - 450 x10E3/uL - - 150   Lipid Panel     Component Value Date/Time   CHOL 179 03/20/2019 0804   TRIG 61 03/20/2019 0804   HDL 51 03/20/2019 0804   LDLCALC 116 (H) 03/20/2019 0804   HEMOGLOBIN A1C No results found for: HGBA1C, MPG TSH Recent Labs    12/06/20 1142  TSH 4.190    Current Outpatient Medications on File Prior to Visit  Medication Sig Dispense Refill   carvedilol (COREG) 3.125 MG tablet Take 1 tablet (3.125 mg total) by mouth 2 (two) times daily with a meal. 180 tablet 1   magnesium oxide (MAG-OX) 400 MG tablet Take 400 mg by mouth 2 (two) times daily.     mexiletine (MEXITIL) 200 MG capsule Take 1 capsule (200 mg total) by mouth 2 (two) times daily. 60 capsule 2   torsemide (DEMADEX) 20 MG tablet Take 20 mg by mouth 2 (two) times daily.     No current facility-administered  medications on file prior to visit.    BNP (last 3 results) Recent Labs    10/03/20 1128 11/21/20 0827 12/02/20 1320  BNP 1,705.4* 2,117.5* 1,791.6*   Radiology:   No results found.  Cardiac Studies:   Exercise sestamibi stress test 08/27/2014: 1. The resting electrocardiogram demonstrated normal sinus rhythm, incomplete LBBB and no resting arrhythmias.  ST segment depression and T-wave inversion in inferior and lateral leads. Stress EKG is negative for  ischemia. The patient performed treadmill exercise using a Bruce protocol, completing 6:26 minutes. Thre were occasional PVCs. The patient completed an estimated workload of 7.65 METS, 95% of the maximum predicted heart rate. The stress test was terminated because of dyspnea. 2. The LV is markedly dilated both at rest and stress images. The LV end diastolic volume was 818EX. Thre is anterior wall thinning. There is no evidence of ischemia. Thre is severe generalized hypokinesis and LVEF markedly depressed at 11%.  Findings are consistent with dilated cardiomyopathy.  Clinical correlation is recommended.   Cardiac MRI 11/20/2017: 1. Severely dilated left ventricle with normal wall thickness and severely decreased systolic function (LVEF = 14%). There is severe diffuse hypokinesis and paradoxical septal motion consistent with LBBB. The ratio of non-compacted to compacted myocardium in the apical and mid portions of the ventricle is > 4:1. There is midwall late gadolinium enhancement in the basal anteroseptal and inferoseptal myocardium. 2. Normal right ventricular size, thickness and systolic function (LVEF = 55%). There are no regional wall motion abnormalities. 3. Severely dilated left atrium (59 mm), normal right atrial size. 4. Moderately dilated pulmonary artery measuring 36 mm. 5. Moderate aortic and mitral regurgitation, mild tricuspid regurgitation. 6. Mild pericardial effusion. Collectively, these findings are consistent with end  stage non-compaction cardiomyopathy.  Nocturnal oximetry 05/20/2019: SpO2 <88%:  53 minutes SpO2 <89%:  77 minutes Highest SPO2 99% and lowest SPO2 76%.   Oxygen desaturation events 225, desaturation index 25. Impression: Patient qualifies for nocturnal oxygen supplementation per Medicare guidelines Group 1.  Cardiopulmonary exercise test 11/05/2019: Exercise testing with gas exchange demonstrates normal functional capacity when compared to matched sedentary norms. There is a mild HF limitation with mildly elevated VE/VCO2 slope. There is likely an additional pulmonary limitation component as patient is ventilatory limited at peak exercise and pre-exercise spirometry demonstrates mild restrictive lung physiology.   PCV ECHOCARDIOGRAM COMPLETE 03/30/2020 Severely depressed LV systolic function with visual EF <20%. Left ventricle cavity is severely dilated. Hypokinetic global wall motion. Doppler evidence of grade III (restrictive) diastolic dysfunction, elevated LAP. Calculated EF 31%. Endocardial wires noted within the right cardiac chambers. Left atrial cavity is severely dilated. Mild to moderate aortic regurgitation. Posterior directed jet, wraps the left atrial wall, severe (Grade III) mitral regurgitation. Moderate to severe tricuspid regurgitation. Moderate pulmonary hypertension. RVSP measures 52 mmHg. IVC is dilated with a respiratory response of >50%. Compared to prior study dated 10/09/2019: G2DD is now G3DD, moderate to severe MR is now severe, mild PHTN 38mmHg is now moderate.  Right heart catheterization 12/04/2020: RA = 5 RV = 52/6 PA = 57/22 (36) PCW = 25 (v wave 30) Fick cardiac output/index = 4.0/2.1 PVR = 2.1 WU Ao sat = 99% PA sat = 62%, 64%  EKG   04/15/2020: AV paced rhythm at a rate of 70 bpm with single PVC.  No further analysis.  03/18/2019: Probably AV paced rhythm with first-degree AV block, biventricular pacemaker detected.  No further analysis. Single  PVC.   ICD   Scheduled  In office ICD 01/18/21  Single (S)/Dual (D)/BV (M) BV Presenting APBP Pacer dependant: No. Underlying ASVS 64/min. AP 92%, BP 96.% Effective BV 91% AMS Episodes 0.   HVR 0. PVC Burden previous 32/hour (OCt to Dec 2022 and Since Dec 2022 to Jan 18th 7%) Longevity 5.8 Years/Voltage.  Lead measurements: Stable Histogram: Low (L)/normal (N)/high (H)  Flat Patient activity Decreased since Dec 2022. Thoracic impedance: Gradually increasing since middle of Jan 2023  Observations: Normal  BV ICD function.  Changes: Turned on Rate response.    Remote CRT-D transmission 01/09/2021:  AP 90%, CRT 89%.  There were 46 ventricular sensing episodes, PVCs.  Patient activity has reduced December 2022.  PVC burden has decreased from 30% to 11% since last transmission on 12/12/2020.  Thoracic impedance is back to baseline and OptiVol does not suggest fluid overload state. CHF  Assessment:     ICD-10-CM   1. Encounter for adjustment of biventricular implantable cardioverter-defibrillator (ICD)  Z45.02     2. Congestive heart failure, NYHA class 3, chronic, systolic (HCC)  H96.22 digoxin (LANOXIN) 0.125 MG tablet    metolazone (ZAROXOLYN) 2.5 MG tablet    potassium chloride SA (KLOR-CON M) 20 MEQ tablet    AMB referral to cardiac rehabilitation    3. ICD- Biventricular Medtronic ICD Claria Mri [id number] in situ 02/28/18  Z95.810     4. Frequent PVCs  I49.3     5. Left ventricular non-compaction cardiomyopathy (HCC)  I42.8     6. Gastroesophageal reflux disease without esophagitis  K21.9 omeprazole (PRILOSEC) 20 MG capsule      Meds ordered this encounter  Medications   digoxin (LANOXIN) 0.125 MG tablet    Sig: Take 1 tablet (0.125 mg total) by mouth daily.    Dispense:  30 tablet    Refill:  2   metolazone (ZAROXOLYN) 2.5 MG tablet    Sig: Take 1 tablet (2.5 mg total) by mouth daily as needed. Take one tab daily for worsening shortness of breath/leg swelling    Dispense:   30 tablet    Refill:  2   potassium chloride SA (KLOR-CON M) 20 MEQ tablet    Sig: Take 2 tablets (40 mEq total) by mouth daily as needed for up to 2 days. Take with Metolazone only    Dispense:  4 tablet    Refill:  0   omeprazole (PRILOSEC) 20 MG capsule    Sig: Take 1 capsule (20 mg total) by mouth daily before breakfast.    Dispense:  30 capsule    Refill:  2     Medications Discontinued During This Encounter  Medication Reason   metolazone (ZAROXOLYN) 2.5 MG tablet Reorder   potassium chloride SA (KLOR-CON M) 20 MEQ tablet Reorder    Recommendations:   Miguel Patterson  is a 76 y.o. 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 Underwent BIV ICD CRT insertion on 02/28/2018 with Dr. Caryl Comes for nonischemic cardiomyopathy.   Patient is also being followed by Dr. Haroldine Laws at [facility], underwent right heart catheterization.  Miguel Patterson was also started on mexiletine for frequent PVCs after discussions between [place].  Miguel Patterson has not been able to tolerate any GDT due to low blood pressure.  Presently on low-dose Coreg.  I reviewed the ICD data with the patient, clearly the PVC burden has markedly decreased.  His overall LV pacing has improved significantly.  Initially felt his OptiVol and impedance were discordant however on review of the data, they are appropriate, Miguel Patterson is now again heading towards fluid accumulation although Miguel Patterson has not crossed the baseline.  Also review of ICD reveals fairly flat histograms that may be contributing to his decreased exercise tolerance as well.  We will turn on rate response.  I would like to  try digoxin 0.125 mg daily, I have started him back on metolazone 2.5 mg along with potassium supplement 20 mEq daily  for worsening dyspnea and leg edema.  I will repeat echocardiogram.  Although his blood pressure is soft, presently asymptomatic.  Miguel Patterson has not been able to tolerate guideline directed medical therapy due to low blood pressure.  I would like to see him back in 3 weeks for follow-up.  Miguel Patterson has been complaining of mild difficulty with eating, feels full in his epigastrium after eating and also feels occasional episodes of discomfort in the chest after eating.  I suspect Miguel Patterson probably has GERD, would empirically like to try omeprazole 20 mg daily.  This was a 40-minute office visit encounter excluding device interrogation and programming.   Adrian Prows, MD, [facility] 01/18/2021, 8:45 PM Office: [phone] Fax: [phone] Pager: [phone]          CC: Pierre Bali, MD

## 2021-01-19 ENCOUNTER — Encounter: Payer: Self-pay | Admitting: Cardiology

## 2021-01-19 ENCOUNTER — Encounter (HOSPITAL_COMMUNITY): Payer: Self-pay | Admitting: *Deleted

## 2021-01-19 NOTE — Progress Notes (Signed)
Received referral from Dr. Einar Gip for this pt to participate in Cardiac rehab with the diagnosis of Chronic systolic heart failure - class 3.  Clinical review of pt follow up appt on 01/18/21 with Dr. Edyth Gunnels - cardiologist office note. Also reviewed progress office visit notes from Dr. Haroldine Laws and neuro.  Pt uses oxygen therapy at night.  Message sent to Dr. Einar Gip for the application of oxygen therapy for saturations less than 90%.  Pt appropriate for scheduling for on site cardiac rehab and/or enrollment in Virtual Cardiac Rehab.  Pt Covid Risk Score is 4.  Will forward to staff for follow up. Cherre Huger, BSN Cardiac and Training and development officer

## 2021-01-19 NOTE — Telephone Encounter (Signed)
From patient.

## 2021-01-20 ENCOUNTER — Telehealth (HOSPITAL_COMMUNITY): Payer: Self-pay | Admitting: *Deleted

## 2021-01-20 NOTE — Telephone Encounter (Signed)
-----   Message from Adrian Prows, MD sent at 01/19/2021  6:24 PM EST ----- Regarding: RE: Oxygen therapy - Cardiac Rehab Thank you so much for being attentive.  He is a wonderful gentleman. ----- Message ----- From: Rowe Pavy, RN Sent: 01/19/2021   9:43 AM EST To: Adrian Prows, MD Subject: Oxygen therapy - Cardiac Rehab                  Dr. Einar Gip,  Thank you so much for the referral of the above pt. Noted in his medical history, uses nocturnal oxygen.  We will monitor his O2 saturations at rest and on exertion.    In the event his O2 saturation drops below 90% may we administer Oxygen therapy?  Thank you for your valued input  Cherre Huger, BSN Cardiac and Pulmonary Rehab Nurse Navigator

## 2021-01-24 ENCOUNTER — Telehealth: Payer: Self-pay | Admitting: Cardiology

## 2021-01-24 DIAGNOSIS — I428 Other cardiomyopathies: Secondary | ICD-10-CM

## 2021-01-24 DIAGNOSIS — I5022 Chronic systolic (congestive) heart failure: Secondary | ICD-10-CM

## 2021-01-24 NOTE — Telephone Encounter (Signed)
Patient says at last appointment, there was discussion of scheduling him for an echocardiogram. Would you pease be able to verify? I do not see an order. I will call him either way to either schedule him or inform him if it is not needed. Thank you.

## 2021-01-26 ENCOUNTER — Other Ambulatory Visit: Payer: Self-pay

## 2021-01-26 ENCOUNTER — Ambulatory Visit: Payer: Medicare Other

## 2021-01-26 DIAGNOSIS — I5022 Chronic systolic (congestive) heart failure: Secondary | ICD-10-CM

## 2021-01-26 DIAGNOSIS — I428 Other cardiomyopathies: Secondary | ICD-10-CM

## 2021-01-30 ENCOUNTER — Encounter: Payer: Self-pay | Admitting: Cardiology

## 2021-02-02 ENCOUNTER — Telehealth (HOSPITAL_COMMUNITY): Payer: Self-pay

## 2021-02-02 NOTE — Telephone Encounter (Signed)
Pt is interested in the cardiac rehab program. I advised pt that his insurance [employer] is not in network with [facility]. Pt was shocked and stated that he was told by the gentleman who sold him the insurance that [facility] was in network. I advised pt that Wynetta Fines the rep told me that his insurance is in network with [facility] facilities. Pt stated that he is going to call and see if he can switch back to his old insurance [employer]. I advised pt that since his new insurance started today 02/02/2021 to see if he can cancel or change plans. I will keep pt cardiac rehab referral open because he will call back at a later time after finds out about switching his insurance.

## 2021-02-02 NOTE — Telephone Encounter (Deleted)
Pt is interested in the cardiac rehab program. I advised pt that his insurance is not in network with [facility]. Pt was shocked and stated that he was told by the gentleman who sold him the insurance that [facility] was in network. I advised pt that Wynetta Fines the rep told me that his insurance is in network with [facility] facilities. Pt stated that he is going to call and see if he can switch back to his old insurance [employer]. I advised pt that since his new insurance started today 02/02/2021 to see if he can cancel or change plans. I will keep pt cardiac rehab referral open because he will call back at a later time after finds out about switching his insurance.

## 2021-02-04 ENCOUNTER — Encounter: Payer: Self-pay | Admitting: Cardiology

## 2021-02-04 DIAGNOSIS — I480 Paroxysmal atrial fibrillation: Secondary | ICD-10-CM

## 2021-02-05 ENCOUNTER — Encounter: Payer: Self-pay | Admitting: Cardiology

## 2021-02-05 DIAGNOSIS — I480 Paroxysmal atrial fibrillation: Secondary | ICD-10-CM | POA: Insufficient documentation

## 2021-02-05 MED ORDER — APIXABAN 5 MG PO TABS: 5.0000 mg | ORAL_TABLET | Freq: Two times a day (BID) | ORAL | 1 refills | Status: DC

## 2021-02-05 NOTE — Telephone Encounter (Signed)
ICD-10-CM   1. Paroxysmal atrial fibrillation (HCC)  I48.0 apixaban (ELIQUIS) 5 MG TABS tablet     CHA2DS2-VASc Score is 3.  Yearly risk of stroke: 3.2% (A, CHF).  Score of 1=0.6; 2=2.2; 3=3.2; 4=4.8; 5=7.2; 6=9.8; 7=>9.8) -(CHF; HTN; vasc disease DM,  Male = 1; Age <65 =0; 65-74 = 1,  >75 =2; stroke/embolism= 2).   CRT-D alert 02/02/2021: Patient in persistent atrial fibrillation with controlled ventricular response since 01/31/2021. Thoracic impedance is at baseline and does not suggest volume overload state.  Normal CRT-D function.   Adrian Prows, MD, [facility] 02/05/2021, 6:02 PM Office: [phone] Fax: [phone] Pager: [phone]

## 2021-02-06 ENCOUNTER — Ambulatory Visit: Payer: Medicare (Managed Care) | Admitting: Cardiology

## 2021-02-06 ENCOUNTER — Other Ambulatory Visit: Payer: Self-pay

## 2021-02-06 DIAGNOSIS — I480 Paroxysmal atrial fibrillation: Secondary | ICD-10-CM

## 2021-02-06 MED ORDER — APIXABAN 5 MG PO TABS: 5.0000 mg | ORAL_TABLET | Freq: Two times a day (BID) | ORAL | 1 refills | Status: DC

## 2021-02-06 NOTE — Telephone Encounter (Signed)
Samples placed upfront.

## 2021-02-06 NOTE — Telephone Encounter (Signed)
Message answered in another mychart encounter.

## 2021-02-08 ENCOUNTER — Encounter: Payer: Self-pay | Admitting: Cardiology

## 2021-02-08 ENCOUNTER — Ambulatory Visit: Payer: Medicare (Managed Care) | Admitting: Cardiology

## 2021-02-08 ENCOUNTER — Other Ambulatory Visit: Payer: Self-pay

## 2021-02-08 VITALS — BP 108/70 | HR 85 | Temp 97.9°F | Resp 17 | Ht 72.0 in | Wt 161.6 lb

## 2021-02-08 DIAGNOSIS — I480 Paroxysmal atrial fibrillation: Secondary | ICD-10-CM

## 2021-02-08 DIAGNOSIS — I5042 Chronic combined systolic (congestive) and diastolic (congestive) heart failure: Secondary | ICD-10-CM

## 2021-02-08 DIAGNOSIS — I428 Other cardiomyopathies: Secondary | ICD-10-CM

## 2021-02-08 DIAGNOSIS — Z95810 Presence of automatic (implantable) cardiac defibrillator: Secondary | ICD-10-CM

## 2021-02-08 NOTE — Progress Notes (Signed)
Primary Physician:  Nickola Major, MD   Patient ID: Miguel Patterson, male    DOB: December 01, 1945, 76 y.o.   MRN: [id number]   Chief Complaint  Patient presents with   Atrial Fibrillation   Follow-up   HPI    Miguel Patterson  is a 76 y.o. 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 Underwent BIV ICD CRT insertion on 02/28/2018 with Dr. Caryl Comes for nonischemic cardiomyopathy.   Patient is also being followed by Dr. Haroldine Laws at [facility].  Since he has been on mexiletine for frequent PVCs, PVC burden has significantly reduced from 30% to 11%.  Also during recent ICD interrogation on 01/18/2021, I had turned on rate response.  About a week ago, patient had noticed worsening dyspnea, leg edema and also scrotal swelling and abdominal discomfort.  I had also messaged him regarding new onset of atrial fibrillation over the past weekend.  His symptoms of dyspnea correlated with A-fib onset.  He now presents for follow-up and states that he has started to feel better again and leg edema has completely resolved, breathing is better and he has resumed his activities.  He is accompanied by his son.  Past Medical History:  Diagnosis Date   Cardiomegaly    Chronic combined systolic and diastolic heart failure ([place]) 02/10/2018   DCM (dilated cardiomyopathy) ([place])    Encounter for adjustment of biventricular implantable cardioverter-defibrillator (ICD) 04/03/2019   Fatigue    ICD- Biventricular Medtronic ICD Claria Mri [id number] in situ 02/28/18 03/01/2018   LBBB (left bundle branch block)    Mild tricuspid regurgitation    Moderate aortic regurgitation    Moderate mitral regurgitation    Pericardial effusion    INSIGNIFICANT    S/P biventricular cardiac pacemaker procedure 02/28/18 with ICD MDT  03/01/2018   Short of breath on exertion    Systolic heart failure (HCC)    Trace pulmonic regurgitation by prior echocardiogram    Past Surgical History:  Procedure Laterality Date   BIV ICD INSERTION CRT-D N/A 02/28/2018   Procedure: BIV ICD INSERTION CRT-D;  Surgeon: Deboraha Sprang, MD;  Location: [facility];  Service: Cardiovascular;  Laterality: N/A;   LEG SURGERY Left 2000   DUE TO BROKE LEG   RIGHT HEART CATH N/A 12/14/2020   Procedure: RIGHT HEART CATH;  Surgeon: Jolaine Artist, MD;  Location: [facility];  Service: Cardiovascular;  Laterality: N/A;   Family History  Problem Relation Age of Onset   Hypertension Mother 65   Thyroid disease Mother    Healthy Father 37   Social History   Tobacco Use   Smoking status: Never   Smokeless tobacco: Never  Substance Use Topics   Alcohol use: No   Marital Status: Divorced  ROS:   Review of Systems  Constitutional: Negative for weight loss.  Cardiovascular:  Positive for dyspnea on exertion. Negative for chest pain, leg swelling, orthopnea and paroxysmal nocturnal dyspnea.  Gastrointestinal:  Positive for heartburn.   Objective:  Blood pressure 108/70, pulse 85, temperature 97.9 F (36.6 C), temperature source Temporal, resp. rate 17, height 6' (1.829 m), weight 161 lb 9.6 oz (73.3 kg), SpO2 100 %. Body mass index is 21.92 kg/m.  Vitals with BMI 02/08/2021 01/18/2021 01/18/2021  Height  6\' 0"  - 6\' 0"   Weight 161 lbs 10 oz - 170 lbs  BMI 25.36 - 64.40  Systolic 347 425 97  Diastolic 70 68 69  Pulse 85 64 59     Physical Exam Vitals reviewed.  Constitutional:      Appearance: He is well-developed.  Neck:     Vascular: JVD present. No carotid bruit.  Cardiovascular:     Rate and Rhythm: Normal rate and regular rhythm.     Pulses: Normal pulses and intact distal pulses.     Heart sounds: S1 normal and S2 normal. Murmur heard.   High-pitched blowing holosystolic murmur is present with a grade of 3/6 at the apex.    No gallop.  Pulmonary:     Effort: Pulmonary effort is normal. No accessory muscle usage or respiratory distress.     Breath sounds: Rales (bibasilar) present. No wheezing or rhonchi.  Musculoskeletal:     Right lower leg: Edema (1-2 + Ankle edema (varicose veins)) present.     Left lower leg: Edema (1-2 + Ankle edema (varicose veins)) present.  Neurological:     Mental Status: He is alert.   Laboratory examination:   CMP Latest Ref Rng & Units 12/14/2020 12/14/2020 12/14/2020  Glucose 70 - 99 mg/dL 106(H) - -  BUN 8 - 23 mg/dL 69(H) - -  Creatinine 0.61 - 1.24 mg/dL 1.98(H) - -  Sodium 135 - 145 mmol/L 139 140 140  Potassium 3.5 - 5.1 mmol/L 2.8(L) 2.8(L) 2.8(L)  Chloride 98 - 111 mmol/L 97(L) - -  CO2 22 - 32 mmol/L 28 - -  Calcium 8.9 - 10.3 mg/dL 9.1 - -  Total Protein 6.5 - 8.1 g/dL - - -  Total Bilirubin 0.3 - 1.2 mg/dL - - -  Alkaline Phos 38 - 126 U/L - - -  AST 15 - 41 U/L - - -  ALT 0 - 44 U/L - - -   CBC Latest Ref Rng & Units 12/14/2020 12/14/2020 12/06/2020  WBC 3.4 - 10.8 x10E3/uL - - 6.1  Hemoglobin 13.0 - 17.0 g/dL 14.6 14.3 13.2  Hematocrit 39.0 - 52.0 % 43.0 42.0 39.7  Platelets 150 - 450 x10E3/uL - - 150   Lipid Panel     Component Value Date/Time   CHOL 179 03/20/2019 0804   TRIG 61 03/20/2019 0804   HDL 51 03/20/2019 0804   LDLCALC 116 (H) 03/20/2019 0804   HEMOGLOBIN A1C No results found for: HGBA1C, MPG TSH Recent Labs    12/06/20 1142  TSH 4.190    Current Outpatient Medications on File Prior to Visit  Medication Sig Dispense Refill   apixaban (ELIQUIS) 5 MG TABS tablet Take 1 tablet (5 mg total) by mouth 2 (two) times daily. 180 tablet 1   carvedilol (COREG) 3.125 MG tablet Take 1 tablet (3.125 mg total) by mouth 2 (two) times daily with a meal. 180 tablet 1   digoxin (LANOXIN) 0.125 MG tablet Take 1 tablet (0.125 mg total) by mouth daily. 30 tablet 2    magnesium oxide (MAG-OX) 400 MG tablet Take 400 mg by mouth 2 (two) times daily.     metolazone (ZAROXOLYN) 2.5 MG tablet Take 1 tablet (2.5 mg total) by mouth daily as needed. Take one tab daily for worsening shortness of breath/leg swelling 30 tablet 2   mexiletine (MEXITIL) 200 MG capsule Take 1 capsule (200 mg total) by mouth 2 (two) times daily. 60 capsule 2   pantoprazole (PROTONIX) 40 MG tablet  Take 40 mg by mouth daily.     Potassium Chloride ER 20 MEQ TBCR Take 1 tablet by mouth 2 (two) times daily.     potassium chloride SA (KLOR-CON M) 20 MEQ tablet Take 2 tablets (40 mEq total) by mouth daily as needed for up to 2 days. Take with Metolazone only 4 tablet 0   torsemide (DEMADEX) 20 MG tablet Take 20 mg by mouth 2 (two) times daily.     No current facility-administered medications on file prior to visit.    BNP (last 3 results) Recent Labs    10/03/20 1128 11/21/20 0827 12/02/20 1320  BNP 1,705.4* 2,117.5* 1,791.6*   Radiology:   No results found.  Cardiac Studies:   Exercise sestamibi stress test 08/27/2014: 1. The resting electrocardiogram demonstrated normal sinus rhythm, incomplete LBBB and no resting arrhythmias.  ST segment depression and T-wave inversion in inferior and lateral leads. Stress EKG is negative for ischemia. The patient performed treadmill exercise using a Bruce protocol, completing 6:26 minutes. Thre were occasional PVCs. The patient completed an estimated workload of 7.65 METS, 95% of the maximum predicted heart rate. The stress test was terminated because of dyspnea. 2. The LV is markedly dilated both at rest and stress images. The LV end diastolic volume was 235TI. Thre is anterior wall thinning. There is no evidence of ischemia. Thre is severe generalized hypokinesis and LVEF markedly depressed at 11%.  Findings are consistent with dilated cardiomyopathy.  Clinical correlation is recommended.   Cardiac MRI 11/20/2017: 1. Severely dilated left  ventricle with normal wall thickness and severely decreased systolic function (LVEF = 14%). There is severe diffuse hypokinesis and paradoxical septal motion consistent with LBBB. The ratio of non-compacted to compacted myocardium in the apical and mid portions of the ventricle is > 4:1. There is midwall late gadolinium enhancement in the basal anteroseptal and inferoseptal myocardium. 2. Normal right ventricular size, thickness and systolic function (LVEF = 55%). There are no regional wall motion abnormalities. 3. Severely dilated left atrium (59 mm), normal right atrial size. 4. Moderately dilated pulmonary artery measuring 36 mm. 5. Moderate aortic and mitral regurgitation, mild tricuspid regurgitation. 6. Mild pericardial effusion. Collectively, these findings are consistent with end stage non-compaction cardiomyopathy.  Nocturnal oximetry 05/20/2019: SpO2 <88%:  53 minutes SpO2 <89%:  77 minutes Highest SPO2 99% and lowest SPO2 76%.   Oxygen desaturation events 225, desaturation index 25. Impression: Patient qualifies for nocturnal oxygen supplementation per Medicare guidelines Group 1.  Cardiopulmonary exercise test 11/05/2019: Exercise testing with gas exchange demonstrates normal functional capacity when compared to matched sedentary norms. There is a mild HF limitation with mildly elevated VE/VCO2 slope. There is likely an additional pulmonary limitation component as patient is ventilatory limited at peak exercise and pre-exercise spirometry demonstrates mild restrictive lung physiology.   PCV ECHOCARDIOGRAM COMPLETE 03/30/2020 Severely depressed LV systolic function with visual EF <20%. Left ventricle cavity is severely dilated. Hypokinetic global wall motion. Doppler evidence of grade III (restrictive) diastolic dysfunction, elevated LAP. Calculated EF 31%. Endocardial wires noted within the right cardiac chambers. Left atrial cavity is severely dilated. Mild to moderate aortic  regurgitation. Posterior directed jet, wraps the left atrial wall, severe (Grade III) mitral regurgitation. Moderate to severe tricuspid regurgitation. Moderate pulmonary hypertension. RVSP measures 52 mmHg. IVC is dilated with a respiratory response of >50%. Compared to prior study dated 10/09/2019: G2DD is now G3DD, moderate to severe MR is now severe, mild PHTN 58mmHg is now moderate.  Right heart catheterization 12/04/2020:  RA = 5 RV = 52/6 PA = 57/22 (36) PCW = 25 (v wave 30) Fick cardiac output/index = 4.0/2.1 PVR = 2.1 WU Ao sat = 99% PA sat = 62%, 64%  EKG   EKG 02/08/2021: Underlying sinus rhythm at rate of 87 bpm, ventricularly paced rhythm.  Biventricular pacemaker detected.  No further analysis.    ICD   Remote CRT-D transmission 01/09/2021: AP 90%, CRT 89%. There were 46 ventricular sensing episodes, PVCs. Patient activity has reduced December 2022. PVC burden has decreased from 30% to 11% since last transmission on 12/12/2020. Thoracic impedance is back to baseline and OptiVol does not suggest fluid overload state.CHF  CRT-D alert 02/02/2021: Patient in persistent atrial fibrillation with controlled ventricular response since 01/31/2021. Thoracic impedance is at baseline and does not suggest volume overload state. Normal CRT-D function.  Assessment:     ICD-10-CM   1. Paroxysmal atrial fibrillation (HCC)  I48.0 EKG 12-Lead    2. Left ventricular non-compaction cardiomyopathy (HCC)  I42.8     3. Chronic combined systolic and diastolic heart failure (HCC)  I50.42     4. ICD- Biventricular Medtronic ICD Claria Mri [id number] in situ 02/28/18  Z95.810       CHA2DS2-VASc Score is 3.  Yearly risk of stroke: 3.2% (A, CHF).  Score of 1=0.6; 2=2.2; 3=3.2; 4=4.8; 5=7.2; 6=9.8; 7=>9.8) -(CHF; HTN; vasc disease DM,  Male = 1; Age <65 =0; 65-74 = 1,  >75 =2; stroke/embolism= 2).   No orders of the defined types were placed in this encounter.    Medications Discontinued During  This Encounter  Medication Reason   omeprazole (PRILOSEC) 20 MG capsule     Recommendations:   Miguel Patterson  is a 76 y.o. 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 Underwent BIV ICD CRT insertion on 02/28/2018 with Dr. Caryl Comes for nonischemic cardiomyopathy.   Patient is also being followed by Dr. Haroldine Laws at [facility].  Since he has been on mexiletine for frequent PVCs, PVC burden has significantly reduced from 30% to 11%.  Also during recent ICD interrogation on 01/18/2021, I had turned on rate response.  Patient has not had any further leg edema, his energy level is improved and he is starting to exercise again.  I discussed the findings of the recent persistent atrial fibrillation that I noticed on ICD transmission, I have started him on Eliquis for the same which he is tolerating.  Also clearly with new onset of atrial fibrillation, patient had acute decompensated heart failure last week by history, he had scrotal swelling, leg edema and decreased exercise tolerance.  Advised him to contact me if he has recurrence and I will continue to monitor his ICD closely.  His son is present today and all questions answered.  He is presently on torsemide 40 mg twice daily and also takes metolazone twice a week 2.5 mg.  If he has increased A-fib frequency, may consider transitioning mexiletine to amiodarone.  Reviewed his echocardiogram, fortunately his LVEF has remained stable without further deterioration.  I will see him back in 3 months for follow-up.   Adrian Prows, MD, [facility] 02/08/2021, 2:53 PM Office: [phone] Fax: [phone] Pager: [phone]          CC: Pierre Bali, MD

## 2021-02-09 NOTE — Telephone Encounter (Signed)
Pt son called and stated that pt will be getting back on [facility] Medicare and leaving [place]. Pt [employer] Medicare will start 03/01/2021. I advised pt son to call back when they receive the [facility] Medicare information so it can be in his account to check his benefits.

## 2021-02-14 NOTE — Telephone Encounter (Signed)
Pt called to give updated information about his [facility] Medicare. Pt's member ID is [id number] and group# [id number] and provider# [phone]. I advised pt that since the plan doesn't take affect until 03/01/2021 I would have to put it in his account on or after 03/01/2021 because right now its still showing inactive. Put understood. I advised pt that I would put this information in his notes for future reference.

## 2021-03-01 ENCOUNTER — Telehealth (HOSPITAL_COMMUNITY): Payer: Self-pay

## 2021-03-01 NOTE — Telephone Encounter (Signed)
Pt insurance is active and benefits verified through [facility] Medicare. Co-pay $0.00, DED $0.00/$0.00 met, out of pocket $3,600.00/$0.00 met, co-insurance 0%. No pre-authorization required. Passport, 03/01/21 @ 10:52AM, OLM#[id number] ?  ?Will contact patient to see if he is interested in the Cardiac Rehab Program. ?

## 2021-03-01 NOTE — Telephone Encounter (Signed)
Called patient to see if he was interested in participating in the Cardiac Rehab Program. Patient stated yes. Patient will come in for orientation on 03/28/21 @ 10:30AM and will attend the 10:30 exercise class. Went over insurance, patient verbalized understanding.  ?  ?Tourist information centre manager. ?

## 2021-03-06 ENCOUNTER — Ambulatory Visit: Payer: Medicare Other | Admitting: Cardiology

## 2021-03-17 ENCOUNTER — Other Ambulatory Visit: Payer: Self-pay | Admitting: Cardiology

## 2021-03-23 ENCOUNTER — Encounter: Payer: Self-pay | Admitting: Student

## 2021-03-23 ENCOUNTER — Ambulatory Visit: Payer: Medicare Other | Admitting: Student

## 2021-03-23 ENCOUNTER — Other Ambulatory Visit: Payer: Self-pay

## 2021-03-23 VITALS — BP 102/69 | HR 78 | Temp 97.2°F | Resp 17 | Ht 72.0 in | Wt 165.4 lb

## 2021-03-23 DIAGNOSIS — I428 Other cardiomyopathies: Secondary | ICD-10-CM

## 2021-03-23 DIAGNOSIS — I480 Paroxysmal atrial fibrillation: Secondary | ICD-10-CM

## 2021-03-23 MED ORDER — MAGNESIUM OXIDE 400 MG PO TABS: 400.0000 mg | ORAL_TABLET | Freq: Two times a day (BID) | ORAL | 3 refills | Status: DC

## 2021-03-23 MED ORDER — AMIODARONE HCL 200 MG PO TABS: ORAL_TABLET | ORAL | 3 refills | Status: DC

## 2021-03-23 MED ORDER — POLYETHYLENE GLYCOL 3350 17 GM/SCOOP PO POWD: 1.0000 | Freq: Once | ORAL | 0 refills | Status: AC

## 2021-03-23 NOTE — Progress Notes (Signed)
? ?Primary Physician:  Nickola Major, MD ? ? ?Patient ID: Miguel Patterson, male    DOB: 11-14-1945, 76 y.o.   MRN: [id number]  ? ?Chief Complaint  ?Patient presents with  ? Atrial Fibrillation  ? ?HPI  ? ? Miguel Patterson  is a 76 y.o. 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 Underwent BIV ICD CRT insertion on 02/28/2018 with Dr. Caryl Comes for nonischemic cardiomyopathy.  ? ?Patient is also being followed by Dr. Haroldine Laws at [facility] clinic.  Since he has been on mexiletine for frequent PVCs, PVC burden has significantly reduced from 30% to 11%.  Also during ICD interrogation on 01/18/2021, turned on rate response. ? ?ICD transmission 02/02/2021 revealed persistent atrial fibrillation, patient was therefore started on oral anticoagulation.  Remote ICD transmission on 03/12/2021 again revealed the patient has been in persistent atrial fibrillation since 01/27/2021, patient was therefore advised to follow-up in our office to consider direct-current cardioversion per Dr. Einar Gip.  Patient reports bilateral leg edema remained stable compared to last office visit.  He states his breathing has been "wonderful" for the last 1 week.  He does have complaints of generalized fatigue.  She is tolerating anticoagulation without bleeding diathesis. ? ?Past Medical History:  ?Diagnosis Date  ? Cardiomegaly   ? Chronic combined systolic and diastolic heart failure ([place]) 02/10/2018  ? DCM (dilated cardiomyopathy) ([place])   ? Encounter for adjustment of biventricular implantable cardioverter-defibrillator (ICD) 04/03/2019  ? Fatigue   ? ICD- Biventricular Medtronic ICD Claria Mri [id number] in situ 02/28/18 03/01/2018  ? LBBB (left bundle branch block)   ? Mild tricuspid  regurgitation   ? Moderate aortic regurgitation   ? Moderate mitral regurgitation   ? Pericardial effusion   ? INSIGNIFICANT  ? S/P biventricular cardiac pacemaker procedure 02/28/18 with ICD MDT  03/01/2018  ? Short of breath on exertion   ? Systolic heart failure ([place])   ? Trace pulmonic regurgitation by prior echocardiogram   ? ?Past Surgical History:  ?Procedure Laterality Date  ? BIV ICD INSERTION CRT-D N/A 02/28/2018  ? Procedure: BIV ICD INSERTION CRT-D;  Surgeon: Deboraha Sprang, MD;  Location: [place];  Service: Cardiovascular;  Laterality: N/A;  ? LEG SURGERY Left 2000  ? DUE TO BROKE LEG  ? RIGHT HEART CATH N/A 12/14/2020  ? Procedure: RIGHT HEART CATH;  Surgeon: Jolaine Artist, MD;  Location: [facility];  Service: Cardiovascular;  Laterality: N/A;  ? ?Family History  ?Problem Relation Age of Onset  ? Hypertension Mother 69  ? Thyroid disease Mother   ? Healthy Father 40  ? ?Social History  ? ?Tobacco Use  ? Smoking status: Never  ? Smokeless tobacco: Never  ?Substance Use Topics  ? Alcohol use: No  ? Marital Status: Divorced ? ?ROS:  ? ?Review of Systems  ?Constitutional: Negative for weight loss.  ?Cardiovascular:  Positive for dyspnea on exertion (improved) and leg swelling (stable). Negative for chest pain, orthopnea and paroxysmal nocturnal dyspnea.  ?Gastrointestinal:  Positive for heartburn.  ? ?Objective:  ?Blood pressure 102/69, pulse 78, temperature (!) 97.2 ?F (36.2 ?C), temperature source Temporal, resp. rate 17, height 6' (1.829 m), weight 165 lb 6.4 oz (75 kg), SpO2 98 %. Body mass index is 22.43 kg/m?Marland Kitchen  ? ?  03/23/2021  ?  9:43 AM 02/08/2021  ?  1:52 PM 01/18/2021  ?  2:27 PM  ?Vitals with BMI  ?Height 6\' 0"  6\' 0"    ?Weight 165 lbs 6 oz 161 lbs 10 oz   ?BMI 22.43 21.91   ?Systolic 161 096 045  ?Diastolic 69 70 68  ?Pulse 78 85 64  ?   ?Physical Exam ?Vitals reviewed.  ?Constitutional:   ?   Appearance: He is well-developed.  ?Neck:  ?   Vascular: JVD present. No carotid  bruit.  ?Cardiovascular:  ?   Rate and Rhythm: Normal rate. Rhythm irregular.  ?   Pulses: Normal pulses and intact distal pulses.  ?   Heart sounds: S1 normal and S2 normal. Murmur heard.  ?High-pitched blowing holosystolic murmur is present with a grade of 3/6 at the apex.  ?  No gallop.  ?Pulmonary:  ?   Effort: Pulmonary effort is normal. No accessory muscle usage or respiratory distress.  ?   Breath sounds: Rales (bibasilar) present. No wheezing or rhonchi.  ?Musculoskeletal:  ?   Right lower leg: Edema (1-2 + edema to mid-shin (varicose veins)) present.  ?   Left lower leg: Edema (1-2 + edema  to mid-shin (varicose veins)) present.  ?Neurological:  ?   Mental Status: He is alert.  ? ?Laboratory examination:  ? ? ?  Latest Ref Rng & Units 12/14/2020  ?  1:12 PM 12/14/2020  ?  1:03 PM 12/02/2020  ?  1:20 PM  ?CMP  ?Glucose 70 - 99 mg/dL 106    129    ?BUN 8 - 23 mg/dL 69    54    ?Creatinine 0.61 - 1.24 mg/dL 1.98    1.78    ?Sodium 135 - 145 mmol/L 139   140    ? 140   141    ?Potassium 3.5 - 5.1 mmol/L 2.8   2.8    ? 2.8   3.5    ?Chloride 98 - 111 mmol/L 97    97    ?CO2 22 - 32 mmol/L 28    27    ?Calcium 8.9 - 10.3 mg/dL 9.1    8.7    ? ? ?  Latest Ref Rng & Units 12/14/2020  ?  1:03 PM 12/06/2020  ? 11:42 AM 02/10/2020  ? 10:55 AM  ?CBC  ?WBC 3.4 - 10.8 x10E3/uL  6.1   4.5    ?Hemoglobin 13.0 - 17.0 g/dL 14.3    ? 14.6   13.2   12.9    ?Hematocrit 39.0 - 52.0 % 42.0    ? 43.0   39.7   40.7    ?Platelets 150 - 450 x10E3/uL  150   135    ? ?Lipid Panel  ?   ?Component Value Date/Time  ? CHOL 179 03/20/2019 0804  ? TRIG 61 03/20/2019 0804  ? HDL 51 03/20/2019 0804  ? [place] 116 (H) 03/20/2019 0804  ? ?HEMOGLOBIN A1C ?No results found for: HGBA1C, MPG ?TSH ?Recent Labs  ?  12/06/20 ?1142  ?TSH 4.190  ?  ?Current Outpatient Medications on File Prior to Visit  ?Medication Sig Dispense Refill  ? apixaban (ELIQUIS) 5 MG TABS tablet Take 1 tablet (5 mg total) by mouth 2 (two) times daily. 180 tablet 1  ? carvedilol  (COREG) 3.125 MG tablet Take 1 tablet (3.125 mg total) by mouth 2 (two) times daily with a meal. 180 tablet 1  ? digoxin (LANOXIN) 0.125 MG  tablet Take 1 tablet (0.125 mg total) by mouth daily. 30 tablet 2  ? metolazone (ZAROXOLYN) 2.5 MG tablet Take 1 tablet (2.5 mg total) by mouth daily as needed. Take one tab daily for worsening shortness of breath/leg swelling 30 tablet 2  ? pantoprazole (PROTONIX) 40 MG tablet Take 40 mg by mouth daily.    ? Potassium Chloride ER 20 MEQ TBCR Take 1 tablet by mouth daily. Taking with metolazone    ? torsemide (DEMADEX) 20 MG tablet Take 20 mg by mouth 2 (two) times daily.    ? ?No current facility-administered medications on file prior to visit.  ?  ?BNP (last 3 results) ?Recent Labs  ?  10/03/20 ?1128 11/21/20 ?3299 12/02/20 ?1320  ?BNP 1,705.4* 2,117.5* 1,791.6*  ? ?Radiology:  ? ?No results found. ? ?Cardiac Studies:  ? ?Exercise sestamibi stress test 08/27/2014: ?1. The resting electrocardiogram demonstrated normal sinus rhythm, incomplete LBBB and no resting arrhythmias.  ST segment depression and T-wave inversion in inferior and lateral leads. Stress EKG is negative for ischemia. The patient performed treadmill exercise using a Bruce protocol, completing 6:26 minutes. Thre were occasional PVCs. The patient completed an estimated workload of 7.65 METS, 95% of the maximum predicted heart rate. The stress test was terminated because of dyspnea. ?2. The LV is markedly dilated both at rest and stress images. The LV end diastolic volume was 242AS. Thre is anterior wall thinning. There is no evidence of ischemia. Thre is severe generalized hypokinesis and LVEF markedly depressed at 11%.  Findings are consistent with dilated cardiomyopathy.  Clinical correlation is recommended. ?  ?Cardiac MRI 11/20/2017: ?1. Severely dilated left ventricle with normal wall thickness and severely decreased systolic function (LVEF = 14%). There is severe diffuse hypokinesis and paradoxical septal  motion consistent with LBBB. The ratio of non-compacted to compacted myocardium in the apical and mid portions of the ventricle is > 4:1. There is midwall late gadolinium enhancement in the basal anteroseptal

## 2021-03-27 ENCOUNTER — Telehealth (HOSPITAL_COMMUNITY): Payer: Self-pay | Admitting: *Deleted

## 2021-03-27 NOTE — Telephone Encounter (Signed)
Spoke with Dr Olin Pia. Completed health history. Confirmed orientation appointment for tomorrow.Barnet Pall, RN,BSN ?03/27/2021 5:07 PM  ?

## 2021-03-28 ENCOUNTER — Other Ambulatory Visit: Payer: Self-pay

## 2021-03-28 ENCOUNTER — Encounter (HOSPITAL_COMMUNITY)
Admission: RE | Admit: 2021-03-28 | Discharge: 2021-03-28 | Disposition: A | Discharge status: Home / Self Care | Payer: Medicare Other | Source: Ambulatory Visit | Attending: Cardiology | Admitting: Cardiology

## 2021-03-28 ENCOUNTER — Encounter (HOSPITAL_COMMUNITY): Payer: Self-pay

## 2021-03-28 VITALS — BP 104/64 | HR 89 | Ht 70.75 in | Wt 164.0 lb

## 2021-03-28 DIAGNOSIS — I5022 Chronic systolic (congestive) heart failure: Secondary | ICD-10-CM | POA: Insufficient documentation

## 2021-03-28 HISTORY — DX: Heart failure, unspecified: I50.9

## 2021-03-28 NOTE — Progress Notes (Signed)
Cardiac Rehab Medication Review by a Nurse ? ?Does the patient  feel that his/her medications are working for him/her?  YES  ? ?Has the patient been experiencing any side effects to the medications prescribed?  NO ? ? ? ?Does the patient have any problems obtaining medications due to transportation or finances?    NO ? ?Understanding of regimen: excellent ?Understanding of indications: excellent ?Potential of compliance: excellent ? ? ? 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 Dr Miguel Patterson stopped taking Mexitil on 03/23/21 and will start taking Amiodarone on 03/29/21 per Dr Einar Gip. ? ? ? ? ?[name] RN ?03/28/2021 12:33 PM ?  ?

## 2021-03-28 NOTE — Progress Notes (Signed)
Cardiac Individual Treatment Plan ? ?Patient Details  ?Name: Miguel Patterson ?MRN: [id number] ?Date of Birth: 09-26-1945 ?Referring Provider:   ?Flowsheet Row CARDIAC REHAB PHASE II ORIENTATION from 03/28/2021 in [place]  ?Referring Provider Adrian Prows, MD  ? ?  ? ? ?Initial Encounter Date:  ?Flowsheet Row CARDIAC REHAB PHASE II ORIENTATION from 03/28/2021 in [place]  ?Date 03/28/21  ? ?  ? ? ?Visit Diagnosis: Heart failure, chronic systolic (HCC) ? ?Patient's Home Medications on Admission: ? ?Current Outpatient Medications:  ?  amiodarone (PACERONE) 200 MG tablet, Take 200 mg twice daily for 1 week. Then continue to take 200 mg once daily., Disp: 90 tablet, Rfl: 3 ?  apixaban (ELIQUIS) 5 MG TABS tablet, Take 1 tablet (5 mg total) by mouth 2 (two) times daily., Disp: 180 tablet, Rfl: 1 ?  carvedilol (COREG) 3.125 MG tablet, Take 1 tablet (3.125 mg total) by mouth 2 (two) times daily with a meal., Disp: 180 tablet, Rfl: 1 ?  digoxin (LANOXIN) 0.125 MG tablet, Take 1 tablet (0.125 mg total) by mouth daily., Disp: 30 tablet, Rfl: 2 ?  magnesium oxide (MAG-OX) 400 MG tablet, Take 1 tablet (400 mg total) by mouth 2 (two) times daily., Disp: 180 tablet, Rfl: 3 ?  metolazone (ZAROXOLYN) 2.5 MG tablet, Take 1 tablet (2.5 mg total) by mouth daily as needed. Take one tab daily for worsening shortness of breath/leg swelling, Disp: 30 tablet, Rfl: 2 ?  pantoprazole (PROTONIX) 40 MG tablet, Take 40 mg by mouth daily., Disp: , Rfl:  ?  Potassium Chloride ER 20 MEQ TBCR, Take 1 tablet by mouth daily. Taking with metolazone, Disp: , Rfl:  ?  torsemide (DEMADEX) 20 MG tablet, Take 20 mg by mouth 2 (two) times daily., Disp: , Rfl:  ? ?Past Medical History: ?Past Medical History:  ?Diagnosis Date  ? Cardiomegaly   ? CHF (congestive heart failure) ([place])   ? Chronic combined systolic and diastolic heart failure ([place]) 02/10/2018  ? DCM (dilated cardiomyopathy) ([place])    ? Encounter for adjustment of biventricular implantable cardioverter-defibrillator (ICD) 04/03/2019  ? Fatigue   ? ICD- Biventricular Medtronic ICD Claria Mri [id number] in situ 02/28/18 03/01/2018  ? LBBB (left bundle branch block)   ? Mild tricuspid regurgitation   ? Moderate aortic regurgitation   ? Moderate mitral regurgitation   ? Pericardial effusion   ? INSIGNIFICANT  ? S/P biventricular cardiac pacemaker procedure 02/28/18 with ICD MDT  03/01/2018  ? Short of breath on exertion   ? Systolic heart failure ([place])   ? Trace pulmonic regurgitation by prior echocardiogram   ? ? ?Tobacco Use: ?Social History  ? ?Tobacco Use  ?Smoking Status Never  ?Smokeless Tobacco Never  ? ? ?Labs: ?Review Flowsheet   ? ?  ?  Latest Ref Rng & Units 03/20/2019 12/14/2020  ?Labs for ITP Cardiac and Pulmonary Rehab  ?Cholestrol 100 - 199 mg/dL 179     ?LDL (calc) 0 - 99 mg/dL 116     ?HDL-C >39 mg/dL 51     ?Trlycerides 0 - 149 mg/dL 61     ?Bicarbonate 20.0 - 28.0 mmol/L  30.9    ? 31.1    ?TCO2 22 - 32 mmol/L  32    ? 32    ?O2 Saturation %  65.0    ? 62.0    ?  ? ? Multiple values from one day are sorted in reverse-chronological order  ?  ?  ? ? ?  Capillary Blood Glucose: ?No results found for: GLUCAP ? ? ?Exercise Target Goals: ?Exercise Program Goal: ?Individual exercise prescription set using results from initial 6 min walk test and THRR while considering  patient?s activity barriers and safety.  ? ?Exercise Prescription Goal: ?Initial exercise prescription builds to 30-45 minutes a day of aerobic activity, 2-3 days per week.  Home exercise guidelines will be given to patient during program as part of exercise prescription that the participant will acknowledge. ? ?Activity Barriers & Risk Stratification: ? Activity Barriers & Cardiac Risk Stratification - 03/28/21 1050   ? ?  ? Activity Barriers & Cardiac Risk Stratification  ? Activity Barriers Shortness of Breath;Other (comment)   ? Comments Low energy   ? Cardiac Risk  Stratification High   ? ?  ?  ? ?  ? ? ?6 Minute Walk: ? 6 Minute Walk   ? ? [place] Name 03/28/21 1109  ?  ?  ?  ? 6 Minute Walk  ? Phase Initial    ? Distance 1145 feet    ? Walk Time 6 minutes    ? # of Rest Breaks 0    ? MPH 2.17    ? METS 2.59    ? RPE 9    ? Perceived Dyspnea  0    ? VO2 Peak 9.07    ? Symptoms No    ? Resting HR 89 bpm    ? Resting BP 104/64    ? Resting Oxygen Saturation  98 %    ? Exercise Oxygen Saturation  during 6 min walk 95 %    ? Max Ex. HR 113 bpm    ? Max Ex. BP 102/62    ? 2 Minute Post BP 100/62    ? ?  ?  ? ?  ? ? ?Oxygen Initial Assessment: ? ? ?Oxygen Re-Evaluation: ? ? ?Oxygen Discharge (Final Oxygen Re-Evaluation): ? ? ?Initial Exercise Prescription: ? Initial Exercise Prescription - 03/28/21 1200   ? ?  ? Date of Initial Exercise RX and Referring Provider  ? Date 03/28/21   ? Referring Provider Adrian Prows, MD   ? Expected Discharge Date 05/26/21   ?  ? Recumbant Bike  ? Level 1   ? Watts --   ? Minutes 15   ? METs 2   ?  ? NuStep  ? Level 1   ? SPM 85   ? Minutes 15   ? METs 2   ?  ? Prescription Details  ? Frequency (times per week) 3   ? Duration Progress to 30 minutes of continuous aerobic without signs/symptoms of physical distress   ?  ? Intensity  ? THRR 40-80% of Max Heartrate 58-116   ? Ratings of Perceived Exertion 11-13   ? Perceived Dyspnea 0-4   ?  ? Progression  ? Progression Continue to progress workloads to maintain intensity without signs/symptoms of physical distress.   ?  ? Resistance Training  ? Training Prescription Yes   ? Weight 2 lbs   ? Reps 10-15   ? ?  ?  ? ?  ? ? ?Perform Capillary Blood Glucose checks as needed. ? ?Exercise Prescription Changes: ? ? ?Exercise Comments: ? ? ?Exercise Goals and Review: ? ? Exercise Goals   ? ? [place] Name 03/28/21 1043  ?  ?  ?  ?  ?  ? Exercise Goals  ? Increase Physical Activity Yes      ? Intervention Provide advice, education,  support and counseling about physical activity/exercise needs.;Develop an individualized  exercise prescription for aerobic and resistive training based on initial evaluation findings, risk stratification, comorbidities and participant's personal goals.      ? Expected Outcomes Short Term: Attend rehab on a regular basis to increase amount of physical activity.;Long Term: Exercising regularly at least 3-5 days a week.;Long Term: Add in home exercise to make exercise part of routine and to increase amount of physical activity.      ? Increase Strength and Stamina Yes      ? Intervention Provide advice, education, support and counseling about physical activity/exercise needs.;Develop an individualized exercise prescription for aerobic and resistive training based on initial evaluation findings, risk stratification, comorbidities and participant's personal goals.      ? Expected Outcomes Short Term: Increase workloads from initial exercise prescription for resistance, speed, and METs.;Short Term: Perform resistance training exercises routinely during rehab and add in resistance training at home;Long Term: Improve cardiorespiratory fitness, muscular endurance and strength as measured by increased METs and functional capacity (6MWT)      ? Able to understand and use rate of perceived exertion (RPE) scale Yes      ? Intervention Provide education and explanation on how to use RPE scale      ? Expected Outcomes Short Term: Able to use RPE daily in rehab to express subjective intensity level;Long Term:  Able to use RPE to guide intensity level when exercising independently      ? Knowledge and understanding of Target Heart Rate Range (THRR) Yes      ? Intervention Provide education and explanation of THRR including how the numbers were predicted and where they are located for reference      ? Expected Outcomes Short Term: Able to state/look up THRR;Long Term: Able to use THRR to govern intensity when exercising independently;Short Term: Able to use daily as guideline for intensity in rehab      ? Able to check  pulse independently Yes      ? Intervention Provide education and demonstration on how to check pulse in carotid and radial arteries.;Review the importance of being able to check your own pulse for safety during in

## 2021-03-28 NOTE — Progress Notes (Signed)
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 Frquent ectopy noted frequent PVC's couplets, bigeminy, triplet times one noted.Patient asymptomatic. Telemetry rhythm paced.  VSS. Dr Einar Gip and Lawerance Cruel [facility] notified. Will continue to monitor. Barnet Pall, RN,BSN ?03/28/2021 3:34 PM  ?

## 2021-04-01 ENCOUNTER — Encounter: Payer: Self-pay | Admitting: Cardiology

## 2021-04-03 ENCOUNTER — Encounter (HOSPITAL_COMMUNITY): Payer: Self-pay | Admitting: Cardiology

## 2021-04-03 ENCOUNTER — Encounter: Payer: Self-pay | Admitting: Cardiology

## 2021-04-03 ENCOUNTER — Encounter (HOSPITAL_COMMUNITY)
Admission: RE | Admit: 2021-04-03 | Discharge: 2021-04-03 | Disposition: A | Discharge status: Home / Self Care | Payer: Medicare Other | Source: Ambulatory Visit | Attending: Cardiology | Admitting: Cardiology

## 2021-04-03 ENCOUNTER — Ambulatory Visit: Payer: Medicare Other | Admitting: Cardiology

## 2021-04-03 VITALS — BP 105/65 | HR 72 | Temp 96.6°F | Resp 16 | Ht 70.0 in | Wt 170.0 lb

## 2021-04-03 VITALS — BP 98/70 | HR 75 | Wt 166.9 lb

## 2021-04-03 DIAGNOSIS — M7989 Other specified soft tissue disorders: Secondary | ICD-10-CM

## 2021-04-03 DIAGNOSIS — I5023 Acute on chronic systolic (congestive) heart failure: Secondary | ICD-10-CM

## 2021-04-03 DIAGNOSIS — I493 Ventricular premature depolarization: Secondary | ICD-10-CM

## 2021-04-03 DIAGNOSIS — I5022 Chronic systolic (congestive) heart failure: Secondary | ICD-10-CM | POA: Insufficient documentation

## 2021-04-03 DIAGNOSIS — L03119 Cellulitis of unspecified part of limb: Secondary | ICD-10-CM

## 2021-04-03 DIAGNOSIS — I4819 Other persistent atrial fibrillation: Secondary | ICD-10-CM

## 2021-04-03 MED ORDER — AMIODARONE HCL 200 MG PO TABS: 200.0000 mg | ORAL_TABLET | Freq: Three times a day (TID) | ORAL | 3 refills | Status: DC

## 2021-04-03 MED ORDER — AMOXICILLIN-POT CLAVULANATE 500-125 MG PO TABS: 1.0000 | ORAL_TABLET | Freq: Three times a day (TID) | ORAL | 0 refills | Status: DC

## 2021-04-03 NOTE — Telephone Encounter (Signed)
From patient.

## 2021-04-03 NOTE — Telephone Encounter (Signed)
Patient scheduled to be seen today. 

## 2021-04-03 NOTE — H&P (View-Only) (Signed)
? ?Primary Physician:  Nickola Major, MD ? ? ?Patient ID: Miguel Patterson, male    DOB: 10-21-1945, 76 y.o.   MRN: [id number]  ? ?Chief Complaint  ?Patient presents with  ? Leg Swelling  ? STAT  ? ?HPI  ? ? Miguel Patterson  is a 76 y.o. 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 Caryl Comes for nonischemic cardiomyopathy.  ? ?Patient is also being followed by Dr. Haroldine Laws at [facility] clinic.  Since he has been on mexiletine for frequent PVCs, PVC burden has significantly reduced from 30% to 11%.  However due to onset of persistent atrial fibrillation, this had to be discontinued, patient was started on amiodarone.  He is presently on loading dose.  He has symptomatic atrial fibrillation with worsening dyspnea. ? ?Today while he was in the cardiac rehab I was called by the rehab nurse stating that he has gained weight, has leg swelling.  I brought him in to be seen on an urgent basis.  Patient states that he was doing relatively well until over the weekend when he suddenly noticed leg swelling and also shortness of breath that is worse than baseline along with a 1 to 2 pound weight gain.  No chest pain. ? ? ?Past Medical History:  ?Diagnosis Date  ? Cardiomegaly   ? CHF (congestive heart failure) ([place])   ? Chronic combined systolic and diastolic heart failure ([place]) 02/10/2018  ? DCM (dilated cardiomyopathy) ([place])   ? Encounter for adjustment of biventricular implantable cardioverter-defibrillator (ICD) 04/03/2019  ? Fatigue   ? ICD- Biventricular Medtronic ICD Claria Mri [id number] in situ 02/28/18 03/01/2018  ? LBBB (left bundle branch block)   ? Mild tricuspid regurgitation   ? Moderate aortic  regurgitation   ? Moderate mitral regurgitation   ? Pericardial effusion   ? INSIGNIFICANT  ? S/P biventricular cardiac pacemaker procedure 02/28/18 with ICD MDT  03/01/2018  ? Short of breath on exertion   ? Systolic heart failure ([place])   ? Trace pulmonic regurgitation by prior echocardiogram   ? ?Past Surgical History:  ?Procedure Laterality Date  ? BIV ICD INSERTION CRT-D N/A 02/28/2018  ? Procedure: BIV ICD INSERTION CRT-D;  Surgeon: Deboraha Sprang, MD;  Location: [facility];  Service: Cardiovascular;  Laterality: N/A;  ? LEG SURGERY Left 2000  ? DUE TO BROKE LEG  ? RIGHT HEART CATH N/A 12/14/2020  ? Procedure: RIGHT HEART CATH;  Surgeon: Jolaine Artist, MD;  Location: [facility] [facility];  Service: Cardiovascular;  Laterality: N/A;  ? ?Family History  ?Problem Relation Age of Onset  ? Hypertension Mother 79  ? Thyroid disease Mother   ? Healthy Father 11  ? ?Social History  ? ?Tobacco Use  ? Smoking status: Never  ? Smokeless tobacco: Never  ?Substance Use Topics  ? Alcohol use: No  ? Marital Status: Divorced ? ?ROS:  ? ?Review of Systems  ?Cardiovascular:  Positive for dyspnea on exertion and leg swelling. Negative for chest pain, orthopnea and paroxysmal nocturnal dyspnea.  ? ?Objective:  ?Blood pressure 105/65, pulse 72, temperature (!) 96.6 ?F (35.9 ?C), temperature source Temporal, resp. rate 16, height 5\' 10"  (1.778 m), weight 170 lb (77.1 kg), SpO2 98 %.  Body mass index is 24.39 kg/m?.  ? ?  04/03/2021  ?  3:15 PM 03/28/2021  ? 10:41 AM 03/23/2021  ?  9:43 AM  ?Vitals with BMI  ?Height 5\' 10"  5' 10.75" 6\' 0"   ?Weight 170 lbs 164 lbs 165 lbs 6 oz  ?BMI 24.39 23.04 22.43  ?Systolic 811 914 782  ?Diastolic 65 64 69  ?Pulse 72 89 78  ?   ?Physical Exam ?Vitals reviewed.  ?Constitutional:   ?   Appearance: He is well-developed.  ?Neck:  ?   Vascular: JVD present. No carotid bruit.  ?Cardiovascular:  ?   Rate and Rhythm: Normal rate and regular rhythm.  ?   Pulses: Normal pulses and intact distal  pulses.  ?   Heart sounds: S1 normal and S2 normal. Murmur heard.  ?High-pitched blowing holosystolic murmur is present with a grade of 3/6 at the apex.  ?  No gallop.  ?Pulmonary:  ?   Effort: Pulmonary effort is normal. No accessory muscle usage or respiratory distress.  ?   Breath sounds: Rales (bibasilar) present. No wheezing or rhonchi.  ?Musculoskeletal:  ?   Right lower leg: Edema (2-3 + Ankle edema (varicose veins). Cellulitis shin) present.  ?   Left lower leg: Edema (2-3 + Ankle edema (varicose veins). Cellulitis shin) present.  ?Skin: ?   General: Skin is warm.  ?   Findings: Lesion (bilateral shin) present.  ? ?Laboratory examination:  ? ? ?  Latest Ref Rng & Units 12/14/2020  ?  1:12 PM 12/14/2020  ?  1:03 PM 12/02/2020  ?  1:20 PM  ?CMP  ?Glucose 70 - 99 mg/dL 106    129    ?BUN 8 - 23 mg/dL 69    54    ?Creatinine 0.61 - 1.24 mg/dL 1.98    1.78    ?Sodium 135 - 145 mmol/L 139   140    ? 140   141    ?Potassium 3.5 - 5.1 mmol/L 2.8   2.8    ? 2.8   3.5    ?Chloride 98 - 111 mmol/L 97    97    ?CO2 22 - 32 mmol/L 28    27    ?Calcium 8.9 - 10.3 mg/dL 9.1    8.7    ? ? ?  Latest Ref Rng & Units 12/14/2020  ?  1:03 PM 12/06/2020  ? 11:42 AM 02/10/2020  ? 10:55 AM  ?CBC  ?WBC 3.4 - 10.8 x10E3/uL  6.1   4.5    ?Hemoglobin 13.0 - 17.0 g/dL 14.3    ? 14.6   13.2   12.9    ?Hematocrit 39.0 - 52.0 % 42.0    ? 43.0   39.7   40.7    ?Platelets 150 - 450 x10E3/uL  150   135    ? ?Lipid Panel  ?   ?Component Value Date/Time  ? CHOL 179 03/20/2019 0804  ? TRIG 61 03/20/2019 0804  ? HDL 51 03/20/2019 0804  ? [place] 116 (H) 03/20/2019 0804  ? ?HEMOGLOBIN A1C ?No results found for: HGBA1C, MPG ?TSH ?Recent Labs  ?  12/06/20 ?1142  ?TSH 4.190  ?  ?Current Outpatient Medications on File Prior to Visit  ?Medication Sig Dispense Refill  ? apixaban (ELIQUIS) 5 MG TABS tablet Take 1 tablet (5 mg total) by mouth 2 (two) times daily. 180 tablet 1  ? carvedilol (COREG) 3.125 MG tablet Take 1 tablet (3.125 mg total) by  mouth 2  (two) times daily with a meal. 180 tablet 1  ? digoxin (LANOXIN) 0.125 MG tablet Take 1 tablet (0.125 mg total) by mouth daily. 30 tablet 2  ? magnesium oxide (MAG-OX) 400 MG tablet Take 1 tablet (400 mg total) by mouth 2 (two) times daily. 180 tablet 3  ? metolazone (ZAROXOLYN) 2.5 MG tablet Take 1 tablet (2.5 mg total) by mouth daily as needed. Take one tab daily for worsening shortness of breath/leg swelling 30 tablet 2  ? pantoprazole (PROTONIX) 40 MG tablet Take 40 mg by mouth daily.    ? Potassium Chloride ER 20 MEQ TBCR Take 1 tablet by mouth daily. Taking with metolazone    ? torsemide (DEMADEX) 20 MG tablet Take 20 mg by mouth 2 (two) times daily.    ? ?No current facility-administered medications on file prior to visit.  ?  ?BNP (last 3 results) ?Recent Labs  ?  10/03/20 ?1128 11/21/20 ?6213 12/02/20 ?1320  ?BNP 1,705.4* 2,117.5* 1,791.6*  ? ?Radiology:  ? ?No results found. ? ?Cardiac Studies:  ? ?Exercise sestamibi stress test 08/27/2014: ?1. The resting electrocardiogram demonstrated normal sinus rhythm, incomplete LBBB and no resting arrhythmias.  ST segment depression and T-wave inversion in inferior and lateral leads. Stress EKG is negative for ischemia. The patient performed treadmill exercise using a Bruce protocol, completing 6:26 minutes. Thre were occasional PVCs. The patient completed an estimated workload of 7.65 METS, 95% of the maximum predicted heart rate. The stress test was terminated because of dyspnea. ?2. The LV is markedly dilated both at rest and stress images. The LV end diastolic volume was 086VH. Thre is anterior wall thinning. There is no evidence of ischemia. Thre is severe generalized hypokinesis and LVEF markedly depressed at 11%.  Findings are consistent with dilated cardiomyopathy.  Clinical correlation is recommended. ?  ?Cardiac MRI 11/20/2017: ?1. Severely dilated left ventricle with normal wall thickness and severely decreased systolic function (LVEF = 14%). There is  severe diffuse hypokinesis and paradoxical septal motion consistent with LBBB. The ratio of non-compacted to compacted myocardium in the apical and mid portions of the ventricle is > 4:1. There is midwall late gadolinium

## 2021-04-03 NOTE — Progress Notes (Signed)
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 Oxygen saturation 96% on room air. 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 Upon assessment lung fields clear upon ascultation. Bilateral lower leg edema present 2-3+. Both of Dr Poli's shin areas are reddened greater on the right. There is also some seeping present on the right mid front lower leg. Dr Olin Pia also says he is having some itching. Blood pressures at cardiac rehab ranged from systolic 51-102. Diastolic BP ranged from 11-17. Dr Einar Gip called and notified about the noted findings. Dr Einar Gip  said his office will call the patient to be evaluated regarding leg swelling and redness. Dr Olin Pia was given a copy of today's vital signs for Dr Einar Gip to review.Barnet Pall, RN,BSN ?04/03/2021 12:12 PM  ? ?

## 2021-04-03 NOTE — Progress Notes (Signed)
Daily Session Note ? ?Patient Details  ?Name: Miguel Patterson ?MRN: [id number] ?Date of Birth: 1945-10-29 ?Referring Provider:   ?Flowsheet Row CARDIAC REHAB PHASE II ORIENTATION from 03/28/2021 in [place]  ?Referring Provider Adrian Prows, MD  ? ?  ? ? ?Encounter Date: 04/03/2021 ? ?Check In: ? ? ? ?Capillary Blood Glucose: ?No results found for this or any previous visit (from the past 24 hour(s)). ? ? ? ?Social History  ? ?Tobacco Use  ?Smoking Status Never  ?Smokeless Tobacco Never  ? ? ?Goals Met:  ?Exercise tolerated well ?No report of concerns or symptoms today ?Strength training completed today ? ?Goals Unmet:  ?Not Applicable ? 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  Pt tolerated light exercise without difficulty took rest breaks when needed as the patient is deconditioned ., telemetry-paced, asymptomatic.  Medication list reconciled. Pt denies barriers to medicaiton compliance.  PSYCHOSOCIAL ASSESSMENT:  PHQ-0. Pt exhibits positive coping skills, hopeful outlook with supportive family and friends.    Pt enjoys playing piano, going to church and indoor planting.   Pt oriented to exercise equipment and routine.    Understanding verbalized.See previous documentation regarding weight gain, swelling and lower leg redness.Barnet Pall, RN,BSN ?04/04/2021 12:38 PM   ? ? ?Dr. Fransico Him is Medical Director for Cardiac Rehab at [facility]. ?

## 2021-04-03 NOTE — Progress Notes (Signed)
? ?Primary Physician:  Nickola Major, MD ? ? ?Patient ID: Miguel Patterson, male    DOB: Mar 19, 1945, 76 y.o.   MRN: [id number]  ? ?Chief Complaint  ?Patient presents with  ? Leg Swelling  ? STAT  ? ?HPI  ? ? Miguel Patterson  is a 76 y.o. 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 Caryl Comes for nonischemic cardiomyopathy.  ? ?Patient is also being followed by Dr. Haroldine Laws at [facility].  Since he has been on mexiletine for frequent PVCs, PVC burden has significantly reduced from 30% to 11%.  However due to onset of persistent atrial fibrillation, this had to be discontinued, patient was started on amiodarone.  He is presently on loading dose.  He has symptomatic atrial fibrillation with worsening dyspnea. ? ?Today while he was in the cardiac rehab I was called by the rehab nurse stating that he has gained weight, has leg swelling.  I brought him in to be seen on an urgent basis.  Patient states that he was doing relatively well until over the weekend when he suddenly noticed leg swelling and also shortness of breath that is worse than baseline along with a 1 to 2 pound weight gain.  No chest pain. ? ? ?Past Medical History:  ?Diagnosis Date  ? Cardiomegaly   ? CHF (congestive heart failure) ([place])   ? Chronic combined systolic and diastolic heart failure ([place]) 02/10/2018  ? DCM (dilated cardiomyopathy) ([place])   ? Encounter for adjustment of biventricular implantable cardioverter-defibrillator (ICD) 04/03/2019  ? Fatigue   ? ICD- Biventricular Medtronic ICD Claria Mri [id number] in situ 02/28/18 03/01/2018  ? LBBB (left bundle branch block)   ? Mild tricuspid regurgitation   ? Moderate aortic  regurgitation   ? Moderate mitral regurgitation   ? Pericardial effusion   ? INSIGNIFICANT  ? S/P biventricular cardiac pacemaker procedure 02/28/18 with ICD MDT  03/01/2018  ? Short of breath on exertion   ? Systolic heart failure ([place])   ? Trace pulmonic regurgitation by prior echocardiogram   ? ?Past Surgical History:  ?Procedure Laterality Date  ? BIV ICD INSERTION CRT-D N/A 02/28/2018  ? Procedure: BIV ICD INSERTION CRT-D;  Surgeon: Deboraha Sprang, MD;  Location: [facility];  Service: Cardiovascular;  Laterality: N/A;  ? LEG SURGERY Left 2000  ? DUE TO BROKE LEG  ? RIGHT HEART CATH N/A 12/14/2020  ? Procedure: RIGHT HEART CATH;  Surgeon: Jolaine Artist, MD;  Location: [facility];  Service: Cardiovascular;  Laterality: N/A;  ? ?Family History  ?Problem Relation Age of Onset  ? Hypertension Mother 69  ? Thyroid disease Mother   ? Healthy Father 27  ? ?Social History  ? ?Tobacco Use  ? Smoking status: Never  ? Smokeless tobacco: Never  ?Substance Use Topics  ? Alcohol use: No  ? Marital Status: Divorced ? ?ROS:  ? ?Review of Systems  ?Cardiovascular:  Positive for dyspnea on exertion and leg swelling. Negative for chest pain, orthopnea and paroxysmal nocturnal dyspnea.  ? ?Objective:  ?Blood pressure 105/65, pulse 72, temperature (!) 96.6 ?F (35.9 ?C), temperature source Temporal, resp. rate 16, height 5\' 10"  (1.778 m), weight 170 lb (77.1 kg), SpO2 98 %.  Body mass index is 24.39 kg/m?.  ? ?  04/03/2021  ?  3:15 PM 03/28/2021  ? 10:41 AM 03/23/2021  ?  9:43 AM  ?Vitals with BMI  ?Height 5\' 10"  5' 10.75" 6\' 0"   ?Weight 170 lbs 164 lbs 165 lbs 6 oz  ?BMI 24.39 23.04 22.43  ?Systolic 062 376 283  ?Diastolic 65 64 69  ?Pulse 72 89 78  ?   ?Physical Exam ?Vitals reviewed.  ?Constitutional:   ?   Appearance: He is well-developed.  ?Neck:  ?   Vascular: JVD present. No carotid bruit.  ?Cardiovascular:  ?   Rate and Rhythm: Normal rate and regular rhythm.  ?   Pulses: Normal pulses and intact distal  pulses.  ?   Heart sounds: S1 normal and S2 normal. Murmur heard.  ?High-pitched blowing holosystolic murmur is present with a grade of 3/6 at the apex.  ?  No gallop.  ?Pulmonary:  ?   Effort: Pulmonary effort is normal. No accessory muscle usage or respiratory distress.  ?   Breath sounds: Rales (bibasilar) present. No wheezing or rhonchi.  ?Musculoskeletal:  ?   Right lower leg: Edema (2-3 + Ankle edema (varicose veins). Cellulitis shin) present.  ?   Left lower leg: Edema (2-3 + Ankle edema (varicose veins). Cellulitis shin) present.  ?Skin: ?   General: Skin is warm.  ?   Findings: Lesion (bilateral shin) present.  ? ?Laboratory examination:  ? ? ?  Latest Ref Rng & Units 12/14/2020  ?  1:12 PM 12/14/2020  ?  1:03 PM 12/02/2020  ?  1:20 PM  ?CMP  ?Glucose 70 - 99 mg/dL 106    129    ?BUN 8 - 23 mg/dL 69    54    ?Creatinine 0.61 - 1.24 mg/dL 1.98    1.78    ?Sodium 135 - 145 mmol/L 139   140    ? 140   141    ?Potassium 3.5 - 5.1 mmol/L 2.8   2.8    ? 2.8   3.5    ?Chloride 98 - 111 mmol/L 97    97    ?CO2 22 - 32 mmol/L 28    27    ?Calcium 8.9 - 10.3 mg/dL 9.1    8.7    ? ? ?  Latest Ref Rng & Units 12/14/2020  ?  1:03 PM 12/06/2020  ? 11:42 AM 02/10/2020  ? 10:55 AM  ?CBC  ?WBC 3.4 - 10.8 x10E3/uL  6.1   4.5    ?Hemoglobin 13.0 - 17.0 g/dL 14.3    ? 14.6   13.2   12.9    ?Hematocrit 39.0 - 52.0 % 42.0    ? 43.0   39.7   40.7    ?Platelets 150 - 450 x10E3/uL  150   135    ? ?Lipid Panel  ?   ?Component Value Date/Time  ? CHOL 179 03/20/2019 0804  ? TRIG 61 03/20/2019 0804  ? HDL 51 03/20/2019 0804  ? [place] 116 (H) 03/20/2019 0804  ? ?HEMOGLOBIN A1C ?No results found for: HGBA1C, MPG ?TSH ?Recent Labs  ?  12/06/20 ?1142  ?TSH 4.190  ?  ?Current Outpatient Medications on File Prior to Visit  ?Medication Sig Dispense Refill  ? apixaban (ELIQUIS) 5 MG TABS tablet Take 1 tablet (5 mg total) by mouth 2 (two) times daily. 180 tablet 1  ? carvedilol (COREG) 3.125 MG tablet Take 1 tablet (3.125 mg total) by  mouth 2  (two) times daily with a meal. 180 tablet 1  ? digoxin (LANOXIN) 0.125 MG tablet Take 1 tablet (0.125 mg total) by mouth daily. 30 tablet 2  ? magnesium oxide (MAG-OX) 400 MG tablet Take 1 tablet (400 mg total) by mouth 2 (two) times daily. 180 tablet 3  ? metolazone (ZAROXOLYN) 2.5 MG tablet Take 1 tablet (2.5 mg total) by mouth daily as needed. Take one tab daily for worsening shortness of breath/leg swelling 30 tablet 2  ? pantoprazole (PROTONIX) 40 MG tablet Take 40 mg by mouth daily.    ? Potassium Chloride ER 20 MEQ TBCR Take 1 tablet by mouth daily. Taking with metolazone    ? torsemide (DEMADEX) 20 MG tablet Take 20 mg by mouth 2 (two) times daily.    ? ?No current facility-administered medications on file prior to visit.  ?  ?BNP (last 3 results) ?Recent Labs  ?  10/03/20 ?1128 11/21/20 ?2979 12/02/20 ?1320  ?BNP 1,705.4* 2,117.5* 1,791.6*  ? ?Radiology:  ? ?No results found. ? ?Cardiac Studies:  ? ?Exercise sestamibi stress test 08/27/2014: ?1. The resting electrocardiogram demonstrated normal sinus rhythm, incomplete LBBB and no resting arrhythmias.  ST segment depression and T-wave inversion in inferior and lateral leads. Stress EKG is negative for ischemia. The patient performed treadmill exercise using a Bruce protocol, completing 6:26 minutes. Thre were occasional PVCs. The patient completed an estimated workload of 7.65 METS, 95% of the maximum predicted heart rate. The stress test was terminated because of dyspnea. ?2. The LV is markedly dilated both at rest and stress images. The LV end diastolic volume was 892JJ. Thre is anterior wall thinning. There is no evidence of ischemia. Thre is severe generalized hypokinesis and LVEF markedly depressed at 11%.  Findings are consistent with dilated cardiomyopathy.  Clinical correlation is recommended. ?  ?Cardiac MRI 11/20/2017: ?1. Severely dilated left ventricle with normal wall thickness and severely decreased systolic function (LVEF = 14%). There is  severe diffuse hypokinesis and paradoxical septal motion consistent with LBBB. The ratio of non-compacted to compacted myocardium in the apical and mid portions of the ventricle is > 4:1. There is midwall late gadolinium

## 2021-04-04 ENCOUNTER — Telehealth (HOSPITAL_COMMUNITY): Payer: Self-pay | Admitting: *Deleted

## 2021-04-04 NOTE — Telephone Encounter (Signed)
Spoke with Dr Olin Pia he will not be able to participate in cardiac rehab due to his leg swelling and has been instructed to hold cardiac rehab per Dr Einar Gip. Will cancel apportionments for the next two weeks and await MD clearance for return to exercise.[name] RN BSN  ?

## 2021-04-05 ENCOUNTER — Telehealth: Payer: Self-pay | Admitting: Student

## 2021-04-05 ENCOUNTER — Encounter (HOSPITAL_COMMUNITY): Payer: Medicare Other

## 2021-04-05 DIAGNOSIS — I5022 Chronic systolic (congestive) heart failure: Secondary | ICD-10-CM

## 2021-04-05 LAB — PRO B NATRIURETIC PEPTIDE: NT-Pro BNP: 17519 pg/mL — ABNORMAL HIGH (ref 0–486)

## 2021-04-05 LAB — DIGOXIN LEVEL: Digoxin, Serum: 1.9 ng/mL — ABNORMAL HIGH (ref 0.5–0.9)

## 2021-04-05 MED ORDER — DIGOXIN 125 MCG PO TABS: 0.1250 mg | ORAL_TABLET | ORAL | 2 refills | Status: DC

## 2021-04-05 NOTE — Telephone Encounter (Signed)
Patient digoxin level is 1.9. Therefore recommend patient hold digoxin for 1 week then resume at 0.125 mg every other day.  ?

## 2021-04-07 ENCOUNTER — Encounter (HOSPITAL_COMMUNITY): Payer: Medicare Other

## 2021-04-10 ENCOUNTER — Encounter (HOSPITAL_COMMUNITY): Payer: Medicare Other

## 2021-04-10 NOTE — Anesthesia Preprocedure Evaluation (Addendum)
Anesthesia Evaluation  ?Patient identified by MRN, date of birth, ID band ?Patient awake ? ? ? ?Reviewed: ?Allergy & Precautions, NPO status , Patient's Chart, lab work & pertinent test results ? ?Airway ?Mallampati: II ? ?TM Distance: >3 FB ?Neck ROM: Full ? ? ? Dental ?no notable dental hx. ?(+) Dental Advisory Given ?  ?Pulmonary ?sleep apnea ,  ?  ?Pulmonary exam normal ?breath sounds clear to auscultation ? ? ? ? ? ? Cardiovascular ?Pt. on home beta blockers ?+CHF  ?+ dysrhythmias Atrial Fibrillation + Cardiac Defibrillator ?+ Valvular Problems/Murmurs AI and MR  ?Rhythm:Regular Rate:Normal ? ?Echocardiogram 01/26/2021:  ?Severely depressed LV systolic function with visual EF 25-30%. Left ventricle cavity is dilated. Normal left ventricular wall thickness.  ?Hypokinetic global wall motion. Doppler evidence of grade III (restrictive) diastolic dysfunction, elevated LAP. Left atrial cavity is severely dilated. IAS bows from left to right,  ?suggestive of elevated LAP.  ?Visually, right ventricle cavity is dilated and systolic function is reduced. ?Endocardial wires noted within the right cardiac chambers.  ?Moderate (Grade II) aortic regurgitation.  ?Severe (Grade III) mitral regurgitation, directed posteriorly towards the LA wall.  ?Moderate tricuspid regurgitation. Moderate pulmonary hypertension. RVSP measures 52 mmHg.  ?Moderate pulmonic regurgitation.  ?Moderate pericardial effusion, predominantly located posteriorly. There is no hemodynamic significance.  ?IVC is dilated with a respiratory response of >50%.  ?Findings suggestive of possible non-compaction cardiomyopathy - clinical correlation required.  ?Compared to study 03/30/2020 no significant change.  ?  ?Neuro/Psych ?negative neurological ROS ?   ? GI/Hepatic ?negative GI ROS, Neg liver ROS,   ?Endo/Other  ?negative endocrine ROS ? Renal/GU ?negative Renal ROS  ? ?  ?Musculoskeletal ?negative musculoskeletal ROS ?(+)   ? Abdominal ?  ?Peds ? Hematology ?negative hematology ROS ?(+)   ?Anesthesia Other Findings ? ? Reproductive/Obstetrics ? ?  ? ? ? ? ? ? ? ? ? ? ? ? ? ?  ?  ? ? ? ? ? ? ? ?Anesthesia Physical ?Anesthesia Plan ? ?ASA: 4 ? ?Anesthesia Plan: General  ? ?Post-op Pain Management: Minimal or no pain anticipated  ? ?Induction: Intravenous ? ?PONV Risk Score and Plan: 2 and Propofol infusion and Treatment may vary due to age or medical condition ? ?Airway Management Planned: Mask ? ?Additional Equipment:  ? ?Intra-op Plan:  ? ?Post-operative Plan:  ? ?Informed Consent: I have reviewed the patients History and Physical, chart, labs and discussed the procedure including the risks, benefits and alternatives for the proposed anesthesia with the patient or authorized representative who has indicated his/her understanding and acceptance.  ? ? ? ?Dental advisory given ? ?Plan Discussed with: CRNA ? ?Anesthesia Plan Comments:   ? ? ? ? ? ?Anesthesia Quick Evaluation ? ?

## 2021-04-11 ENCOUNTER — Encounter (HOSPITAL_COMMUNITY)
Admission: RE | Disposition: A | Discharge status: Home / Self Care | Payer: Self-pay | Source: Home / Self Care | Attending: Cardiology

## 2021-04-11 ENCOUNTER — Ambulatory Visit (HOSPITAL_BASED_OUTPATIENT_CLINIC_OR_DEPARTMENT_OTHER): Payer: Medicare Other | Admitting: Anesthesiology

## 2021-04-11 ENCOUNTER — Encounter (HOSPITAL_COMMUNITY): Payer: Self-pay | Admitting: Cardiology

## 2021-04-11 ENCOUNTER — Other Ambulatory Visit: Payer: Self-pay

## 2021-04-11 ENCOUNTER — Ambulatory Visit (HOSPITAL_COMMUNITY): Payer: Medicare Other | Admitting: Anesthesiology

## 2021-04-11 ENCOUNTER — Ambulatory Visit (HOSPITAL_COMMUNITY)
Admission: RE | Admit: 2021-04-11 | Discharge: 2021-04-11 | Disposition: A | Discharge status: Home / Self Care | Payer: Medicare Other | Attending: Cardiology | Admitting: Cardiology

## 2021-04-11 DIAGNOSIS — I110 Hypertensive heart disease with heart failure: Secondary | ICD-10-CM | POA: Insufficient documentation

## 2021-04-11 DIAGNOSIS — I493 Ventricular premature depolarization: Secondary | ICD-10-CM | POA: Diagnosis not present

## 2021-04-11 DIAGNOSIS — I5023 Acute on chronic systolic (congestive) heart failure: Secondary | ICD-10-CM | POA: Insufficient documentation

## 2021-04-11 DIAGNOSIS — I4819 Other persistent atrial fibrillation: Secondary | ICD-10-CM | POA: Diagnosis not present

## 2021-04-11 DIAGNOSIS — I080 Rheumatic disorders of both mitral and aortic valves: Secondary | ICD-10-CM | POA: Diagnosis not present

## 2021-04-11 DIAGNOSIS — I5042 Chronic combined systolic (congestive) and diastolic (congestive) heart failure: Secondary | ICD-10-CM | POA: Diagnosis not present

## 2021-04-11 DIAGNOSIS — Z79899 Other long term (current) drug therapy: Secondary | ICD-10-CM | POA: Insufficient documentation

## 2021-04-11 DIAGNOSIS — I4891 Unspecified atrial fibrillation: Secondary | ICD-10-CM | POA: Diagnosis not present

## 2021-04-11 DIAGNOSIS — Z7901 Long term (current) use of anticoagulants: Secondary | ICD-10-CM | POA: Diagnosis not present

## 2021-04-11 DIAGNOSIS — G4730 Sleep apnea, unspecified: Secondary | ICD-10-CM

## 2021-04-11 DIAGNOSIS — I420 Dilated cardiomyopathy: Secondary | ICD-10-CM | POA: Insufficient documentation

## 2021-04-11 HISTORY — DX: Presence of automatic (implantable) cardiac defibrillator: Z95.810

## 2021-04-11 HISTORY — DX: Sleep apnea, unspecified: G47.30

## 2021-04-11 HISTORY — PX: CARDIOVERSION: SHX1299

## 2021-04-11 LAB — POCT I-STAT, CHEM 8
BUN: 55 mg/dL — ABNORMAL HIGH (ref 8–23)
Calcium, Ion: 1.03 mmol/L — ABNORMAL LOW (ref 1.15–1.40)
Chloride: 92 mmol/L — ABNORMAL LOW (ref 98–111)
Creatinine, Ser: 1.8 mg/dL — ABNORMAL HIGH (ref 0.61–1.24)
Glucose, Bld: 110 mg/dL — ABNORMAL HIGH (ref 70–99)
HCT: 43 % (ref 39.0–52.0)
Hemoglobin: 14.6 g/dL (ref 13.0–17.0)
Potassium: 3.2 mmol/L — ABNORMAL LOW (ref 3.5–5.1)
Sodium: 137 mmol/L (ref 135–145)
TCO2: 33 mmol/L — ABNORMAL HIGH (ref 22–32)

## 2021-04-11 SURGERY — CARDIOVERSION
Anesthesia: General

## 2021-04-11 MED ORDER — LIDOCAINE 2% (20 MG/ML) 5 ML SYRINGE
INTRAMUSCULAR | Status: DC | PRN
  Administered 2021-04-11: 75 mg via INTRAVENOUS

## 2021-04-11 MED ORDER — ETOMIDATE 2 MG/ML IV SOLN
INTRAVENOUS | Status: DC | PRN
  Administered 2021-04-11: 10 mg via INTRAVENOUS

## 2021-04-11 MED ORDER — SODIUM CHLORIDE 0.9 % IV SOLN: INTRAVENOUS | Status: DC

## 2021-04-11 NOTE — Anesthesia Postprocedure Evaluation (Signed)
Anesthesia Post Note ? ?Patient: Miguel Patterson ? ?Procedure(s) Performed: CARDIOVERSION ? ?  ? ?Patient location during evaluation: Endoscopy ?Anesthesia Type: General ?Level of consciousness: sedated and patient cooperative ?Pain management: pain level controlled ?Vital Signs Assessment: post-procedure vital signs reviewed and stable ?Respiratory status: spontaneous breathing ?Cardiovascular status: stable ?Anesthetic complications: no ? ? ?No notable events documented. ? ?Last Vitals:  ?Vitals:  ? 04/11/21 0826 04/11/21 0836  ?BP: 100/66 98/69  ?Pulse: 69 71  ?Resp: 11 (!) 21  ?Temp:    ?SpO2: 100% 100%  ?  ?Last Pain:  ?Vitals:  ? 04/11/21 0836  ?TempSrc:   ?PainSc: 0-No pain  ? ? ?  ?  ?  ?  ?  ?  ? ?Nolon Nations ? ? ? ? ?

## 2021-04-11 NOTE — CV Procedure (Signed)
Direct current cardioversion 04/11/2021 7:54 AM ? ?Indication symptomatic A. Fibrillation. ? ?Procedure: Using 10 mg of IV Etomidate and 75 IV Lidocaine (for reducing venous pain) for achieving deep sedation, synchronized direct current cardioversion performed. Patient was delivered with 150 Joules of electricity X 1 with success to NSR (AP-BV paced. Patient tolerated the procedure well. No immediate complication noted.  ? ?Allergies as of 04/11/2021   ? ?   Reactions  ? Losartan Potassium Anaphylaxis  ? angioedema  ? Aspirin Other (See Comments)  ? Stomach pain  ? ?  ? ?  ?Medication List  ?  ? ?TAKE these medications   ? ?amiodarone 200 MG tablet ?Commonly known as: PACERONE ?Take 1 tablet (200 mg total) by mouth in the morning, at noon, and at bedtime. Take 200 mg tablet 3/day 04/11/21 then twice daily for 1 week. Then continue to take 200 mg once daily. ?What changed: when to take this ?  ?amoxicillin-clavulanate 500-125 MG tablet ?Commonly known as: Augmentin ?Take 1 tablet (500 mg total) by mouth 3 (three) times daily. ?  ?apixaban 5 MG Tabs tablet ?Commonly known as: Eliquis ?Take 1 tablet (5 mg total) by mouth 2 (two) times daily. ?  ?carvedilol 3.125 MG tablet ?Commonly known as: COREG ?Take 1 tablet (3.125 mg total) by mouth 2 (two) times daily with a meal. ?  ?CORTIZONE-10 EX ?Apply 1 application. topically daily as needed (Itching). ?  ?digoxin 0.125 MG tablet ?Commonly known as: LANOXIN ?Take 1 tablet (0.125 mg total) by mouth every other day. ?What changed: when to take this ?  ?magnesium oxide 400 MG tablet ?Commonly known as: MAG-OX ?Take 1 tablet (400 mg total) by mouth 2 (two) times daily. ?  ?metolazone 2.5 MG tablet ?Commonly known as: ZAROXOLYN ?Take 1 tablet (2.5 mg total) by mouth daily as needed. Take one tab daily for worsening shortness of breath/leg swelling ?What changed: when to take this ?  ?pantoprazole 40 MG tablet ?Commonly known as: PROTONIX ?Take 40 mg by mouth daily. ?  ?polyethylene  glycol 17 g packet ?Commonly known as: MIRALAX / GLYCOLAX ?Take 17 g by mouth daily. ?  ?Potassium Chloride ER 20 MEQ Tbcr ?Take 20 mg by mouth daily. Taking with metolazone ?  ?torsemide 20 MG tablet ?Commonly known as: DEMADEX ?Take 20 mg by mouth 2 (two) times daily. ?  ? ?  ? ? ? ? ?Adrian Prows, MD, [facility] ?04/11/2021, 7:54 AM ?Office: [phone] ?Fax: [phone] ?Pager: [phone]  ? ?

## 2021-04-11 NOTE — Progress Notes (Signed)
Cardiac Individual Treatment Plan ? ?Patient Details  ?Name: Miguel Patterson ?MRN: [id number] ?Date of Birth: 23-May-1945 ?Referring Provider:   ?Flowsheet Row CARDIAC REHAB PHASE II ORIENTATION from 03/28/2021 in [facility]  ?Referring Provider Adrian Prows, MD  ? ?  ? ? ?Initial Encounter Date:  ?Flowsheet Row CARDIAC REHAB PHASE II ORIENTATION from 03/28/2021 in [facility]  ?Date 03/28/21  ? ?  ? ? ?Visit Diagnosis: Heart failure, chronic systolic (HCC) ? ?Patient's Home Medications on Admission: ? ?Current Outpatient Medications:  ?  amiodarone (PACERONE) 200 MG tablet, Take 1 tablet (200 mg total) by mouth in the morning, at noon, and at bedtime. Take 200 mg tablet 3/day 04/11/21 then twice daily for 1 week. Then continue to take 200 mg once daily. (Patient taking differently: Take 200 mg by mouth daily. Take 200 mg tablet 3/day 04/11/21 then twice daily for 1 week. Then continue to take 200 mg once daily.), Disp: 90 tablet, Rfl: 3 ?  amoxicillin-clavulanate (AUGMENTIN) 500-125 MG tablet, Take 1 tablet (500 mg total) by mouth 3 (three) times daily., Disp: 30 tablet, Rfl: 0 ?  apixaban (ELIQUIS) 5 MG TABS tablet, Take 1 tablet (5 mg total) by mouth 2 (two) times daily., Disp: 180 tablet, Rfl: 1 ?  carvedilol (COREG) 3.125 MG tablet, Take 1 tablet (3.125 mg total) by mouth 2 (two) times daily with a meal., Disp: 180 tablet, Rfl: 1 ?  digoxin (LANOXIN) 0.125 MG tablet, Take 1 tablet (0.125 mg total) by mouth every other day. (Patient taking differently: Take 0.125 mg by mouth daily.), Disp: 30 tablet, Rfl: 2 ?  Hydrocortisone (CORTIZONE-10 EX), Apply 1 application. topically daily as needed (Itching)., Disp: , Rfl:  ?  magnesium oxide (MAG-OX) 400 MG tablet, Take 1 tablet (400 mg total) by mouth 2 (two) times daily., Disp: 180 tablet, Rfl: 3 ?  metolazone (ZAROXOLYN) 2.5 MG tablet, Take 1 tablet (2.5 mg total) by mouth daily as needed. Take one tab daily  for worsening shortness of breath/leg swelling (Patient taking differently: Take 2.5 mg by mouth daily. Take one tab daily for worsening shortness of breath/leg swelling), Disp: 30 tablet, Rfl: 2 ?  pantoprazole (PROTONIX) 40 MG tablet, Take 40 mg by mouth daily., Disp: , Rfl:  ?  polyethylene glycol (MIRALAX / GLYCOLAX) 17 g packet, Take 17 g by mouth daily., Disp: , Rfl:  ?  Potassium Chloride ER 20 MEQ TBCR, Take 20 mg by mouth daily. Taking with metolazone, Disp: , Rfl:  ?  torsemide (DEMADEX) 20 MG tablet, Take 20 mg by mouth 2 (two) times daily., Disp: , Rfl:  ? ?Past Medical History: ?Past Medical History:  ?Diagnosis Date  ? AICD (automatic cardioverter/defibrillator) present   ? Cardiomegaly   ? CHF (congestive heart failure) ([place] [employer])   ? Chronic combined systolic and diastolic heart failure ([place]) 02/10/2018  ? DCM (dilated cardiomyopathy) ([place])   ? Encounter for adjustment of biventricular implantable cardioverter-defibrillator (ICD) 04/03/2019  ? Fatigue   ? ICD- Biventricular Medtronic ICD Claria Mri [id number] in situ 02/28/18 03/01/2018  ? LBBB (left bundle branch block)   ? Mild tricuspid regurgitation   ? Moderate aortic regurgitation   ? Moderate mitral regurgitation   ? Pericardial effusion   ? INSIGNIFICANT  ? S/P biventricular cardiac pacemaker procedure 02/28/18 with ICD MDT  03/01/2018  ? Short of breath on exertion   ? Sleep apnea   ? Systolic heart failure ([place])   ? Trace  pulmonic regurgitation by prior echocardiogram   ? ? ?Tobacco Use: ?Social History  ? ?Tobacco Use  ?Smoking Status Never  ?Smokeless Tobacco Never  ? ? ?Labs: ?Review Flowsheet   ? ?  ?  Latest Ref Rng & Units 03/20/2019 12/14/2020 04/11/2021  ?Labs for ITP Cardiac and Pulmonary Rehab  ?Cholestrol 100 - 199 mg/dL 179      ?LDL (calc) 0 - 99 mg/dL 116      ?HDL-C >39 mg/dL 51      ?Trlycerides 0 - 149 mg/dL 61      ?Bicarbonate 20.0 - 28.0 mmol/L  30.9    ? 31.1     ?TCO2 22 - 32 mmol/L  32    ? 32   33    ?O2 Saturation %  65.0    ?  62.0     ?  ? ? Multiple values from one day are sorted in reverse-chronological order  ?  ?  ? ? ?Capillary Blood Glucose: ?No results found for: GLUCAP ? ? ?Exercise Target Goals: ?Exercise Program Goal: ?Individual exercise prescription set using results from initial 6 min walk test and THRR while considering  patient?s activity barriers and safety.  ? ?Exercise Prescription Goal: ?Initial exercise prescription builds to 30-45 minutes a day of aerobic activity, 2-3 days per week.  Home exercise guidelines will be given to patient during program as part of exercise prescription that the participant will acknowledge. ? ?Activity Barriers & Risk Stratification: ? Activity Barriers & Cardiac Risk Stratification - 03/28/21 1050   ? ?  ? Activity Barriers & Cardiac Risk Stratification  ? Activity Barriers Shortness of Breath;Other (comment)   ? Comments Low energy   ? Cardiac Risk Stratification High   ? ?  ?  ? ?  ? ? ?6 Minute Walk: ? 6 Minute Walk   ? ? [place] Name 03/28/21 1109  ?  ?  ?  ? 6 Minute Walk  ? Phase Initial    ? Distance 1145 feet    ? Walk Time 6 minutes    ? # of Rest Breaks 0    ? MPH 2.17    ? METS 2.59    ? RPE 9    ? Perceived Dyspnea  0    ? VO2 Peak 9.07    ? Symptoms No    ? Resting HR 89 bpm    ? Resting BP 104/64    ? Resting Oxygen Saturation  98 %    ? Exercise Oxygen Saturation  during 6 min walk 95 %    ? Max Ex. HR 113 bpm    ? Max Ex. BP 102/62    ? 2 Minute Post BP 100/62    ? ?  ?  ? ?  ? ? ?Oxygen Initial Assessment: ? ? ?Oxygen Re-Evaluation: ? ? ?Oxygen Discharge (Final Oxygen Re-Evaluation): ? ? ?Initial Exercise Prescription: ? Initial Exercise Prescription - 03/28/21 1200   ? ?  ? Date of Initial Exercise RX and Referring Provider  ? Date 03/28/21   ? Referring Provider Adrian Prows, MD   ? Expected Discharge Date 05/26/21   ?  ? [place] Bike  ? Level 1   ? [place] --   ? Minutes 15   ? METs 2   ?  ? NuStep  ? Level 1   ? SPM 85   ? Minutes 15   ? METs 2   ?  ? Prescription Details   ?  Frequency (times per week) 3   ? Duration Progress to 30 minutes of continuous aerobic without signs/symptoms of physical distress   ?  ? Intensity  ? THRR 40-80% of Max Heartrate 58-116   ? Ratings of Perceived Exertion 11-13   ? Perceived Dyspnea 0-4   ?  ? Progression  ? Progression Continue to progress workloads to maintain intensity without signs/symptoms of physical distress.   ?  ? Resistance Training  ? Training Prescription Yes   ? Weight 2 lbs   ? Reps 10-15   ? ?  ?  ? ?  ? ? ?Perform Capillary Blood Glucose checks as needed. ? ?Exercise Prescription Changes: ? ? Exercise Prescription Changes   ? ? [place] Name 04/03/21 1029  ?  ?  ?  ?  ?  ? Response to Exercise  ? Blood Pressure (Admit) 98/70      ? Blood Pressure (Exercise) 104/60      ? Blood Pressure (Exit) 98/68      ? Heart Rate (Admit) 75 bpm      ? Heart Rate (Exercise) 92 bpm      ? Heart Rate (Exit) 70 bpm      ? Oxygen Saturation (Exit) 96 %      ? Rating of Perceived Exertion (Exercise) 13      ? Perceived Dyspnea (Exercise) 0      ? Symptoms None      ? Comments First session of exercise, bilateral leg edema and redness notes. No SOB.      ? Duration Continue with 30 min of aerobic exercise without signs/symptoms of physical distress.      ? Intensity THRR unchanged      ?  ? Progression  ? Progression Continue to progress workloads to maintain intensity without signs/symptoms of physical distress.      ? Average METs 1.9      ?  ? Resistance Training  ? Training Prescription Yes      ? Weight 2 lbs      ? Reps 10-15      ? Time 10 Minutes      ?  ? Interval Training  ? Interval Training No      ?  ? [place] Bike  ? Level 1      ? Minutes 15      ? METs 1.6      ?  ? NuStep  ? Level 1      ? SPM 84      ? Minutes 15      ? METs 2.3      ? ?  ?  ? ?  ? ? ?Exercise Comments: ? ? Exercise Comments   ? ? [place] Name 04/03/21 1133 04/11/21 0758  ?  ?  ?  ? Exercise Comments Patient tolerated low intensity exercise fair, no SOB with exercise,  cool-down stretches performed seated. Patient noted to have bilateral, lower leg edema and redness. Seen by cardiologist and diagnosed with cellulitis. On hold from cardiac rehab x2 weeks. Patient had car

## 2021-04-11 NOTE — Anesthesia Procedure Notes (Signed)
Procedure Name: [place] ?Date/Time: 04/11/2021 7:41 AM ?Performed by: Dorann Lodge, CRNA ?Pre-anesthesia Checklist: Patient identified, Emergency Drugs available, Suction available and Patient being monitored ?Patient Re-evaluated:Patient Re-evaluated prior to induction ?Oxygen Delivery Method: Nasal cannula ?Induction Type: IV induction ? ? ? ? ?

## 2021-04-11 NOTE — Discharge Instructions (Signed)

## 2021-04-11 NOTE — Interval H&P Note (Signed)
History and Physical Interval Note: ? ?04/11/2021 ?7:45 AM ? ?Miguel Patterson  has presented today for surgery, with the diagnosis of AFIB.  The various methods of treatment have been discussed with the patient and family. After consideration of risks, benefits and other options for treatment, the patient has consented to  Procedure(s): ?CARDIOVERSION (N/A) as a surgical intervention.  The patient's history has been reviewed, patient examined, no change in status, stable for surgery.  I have reviewed the patient's chart and labs.  Questions were answered to the patient's satisfaction.   ? ? ?Adrian Prows ? ? ?

## 2021-04-11 NOTE — Transfer of Care (Signed)
Immediate Anesthesia Transfer of Care Note ? ?Patient: Miguel Patterson ? ?Procedure(s) Performed: CARDIOVERSION ? ?Patient Location: Endoscopy Unit ? ?Anesthesia Type:MAC ? ?Level of Consciousness: awake and drowsy ? ?Airway & Oxygen Therapy: Patient Spontanous Breathing and Patient connected to nasal cannula oxygen ? ?Post-op Assessment: Report given to RN and Post -op Vital signs reviewed and stable ? ?Post vital signs: Reviewed and stable ? ?Last Vitals:  ?Vitals Value Taken Time  ?BP 93/73 (77)   ?Temp    ?Pulse 80   ?Resp 18   ?SpO2 100   ? ? ?Last Pain:  ?Vitals:  ? 04/11/21 0654  ?TempSrc: Oral  ?PainSc: 0-No pain  ?   ? ?  ? ?Complications: No notable events documented. ?

## 2021-04-12 ENCOUNTER — Encounter (HOSPITAL_COMMUNITY): Payer: Self-pay | Admitting: Cardiology

## 2021-04-12 ENCOUNTER — Encounter (HOSPITAL_COMMUNITY): Payer: Medicare Other

## 2021-04-14 ENCOUNTER — Encounter (HOSPITAL_COMMUNITY): Payer: Medicare Other

## 2021-04-17 ENCOUNTER — Encounter (HOSPITAL_COMMUNITY): Payer: Medicare Other

## 2021-04-18 ENCOUNTER — Telehealth (HOSPITAL_COMMUNITY): Payer: Self-pay

## 2021-04-18 ENCOUNTER — Ambulatory Visit (HOSPITAL_BASED_OUTPATIENT_CLINIC_OR_DEPARTMENT_OTHER)
Admission: RE | Admit: 2021-04-18 | Discharge: 2021-04-18 | Disposition: A | Discharge status: Home / Self Care | Payer: Medicare Other | Source: Ambulatory Visit | Attending: Internal Medicine | Admitting: Internal Medicine

## 2021-04-18 VITALS — BP 112/78 | HR 78 | Wt 156.6 lb

## 2021-04-18 DIAGNOSIS — I5023 Acute on chronic systolic (congestive) heart failure: Secondary | ICD-10-CM

## 2021-04-18 DIAGNOSIS — I447 Left bundle-branch block, unspecified: Secondary | ICD-10-CM | POA: Insufficient documentation

## 2021-04-18 DIAGNOSIS — I428 Other cardiomyopathies: Secondary | ICD-10-CM | POA: Diagnosis not present

## 2021-04-18 DIAGNOSIS — I38 Endocarditis, valve unspecified: Secondary | ICD-10-CM | POA: Insufficient documentation

## 2021-04-18 DIAGNOSIS — Z79899 Other long term (current) drug therapy: Secondary | ICD-10-CM | POA: Insufficient documentation

## 2021-04-18 DIAGNOSIS — I130 Hypertensive heart and chronic kidney disease with heart failure and stage 1 through stage 4 chronic kidney disease, or unspecified chronic kidney disease: Secondary | ICD-10-CM | POA: Diagnosis not present

## 2021-04-18 DIAGNOSIS — I509 Heart failure, unspecified: Secondary | ICD-10-CM | POA: Diagnosis not present

## 2021-04-18 DIAGNOSIS — E876 Hypokalemia: Secondary | ICD-10-CM | POA: Insufficient documentation

## 2021-04-18 DIAGNOSIS — I480 Paroxysmal atrial fibrillation: Secondary | ICD-10-CM | POA: Insufficient documentation

## 2021-04-18 DIAGNOSIS — N1832 Chronic kidney disease, stage 3b: Secondary | ICD-10-CM | POA: Insufficient documentation

## 2021-04-18 LAB — BASIC METABOLIC PANEL
Anion gap: 13 (ref 5–15)
BUN: 84 mg/dL — ABNORMAL HIGH (ref 8–23)
CO2: 28 mmol/L (ref 22–32)
Calcium: 9.1 mg/dL (ref 8.9–10.3)
Chloride: 94 mmol/L — ABNORMAL LOW (ref 98–111)
Creatinine, Ser: 2.21 mg/dL — ABNORMAL HIGH (ref 0.61–1.24)
GFR, Estimated: 30 mL/min — ABNORMAL LOW (ref >60)
Glucose, Bld: 136 mg/dL — ABNORMAL HIGH (ref 70–99)
Potassium: 2.7 mmol/L — CL (ref 3.5–5.1)
Sodium: 135 mmol/L (ref 135–145)

## 2021-04-18 LAB — BRAIN NATRIURETIC PEPTIDE: B Natriuretic Peptide: 1882.3 pg/mL — ABNORMAL HIGH (ref 0.0–100.0)

## 2021-04-18 MED ORDER — POTASSIUM CHLORIDE ER 20 MEQ PO TBCR
60.0000 meq | EXTENDED_RELEASE_TABLET | Freq: Every day | ORAL | 6 refills | Status: DC
Start: 2021-04-18 — End: 2021-04-28

## 2021-04-18 MED ORDER — SPIRONOLACTONE 25 MG PO TABS: 12.5000 mg | ORAL_TABLET | Freq: Every day | ORAL | 6 refills | Status: DC

## 2021-04-18 MED ORDER — TORSEMIDE 20 MG PO TABS: 40.0000 mg | ORAL_TABLET | Freq: Two times a day (BID) | ORAL | 6 refills | Status: DC

## 2021-04-18 NOTE — Patient Instructions (Signed)
START Spironolactone 12.5 mg (1/2 tab) Daily ? ?INCREASE Torsemide to 40 mg (2 tabs) Twice daily  ? ?INCREASE Potassium to 60 meq (3 tabs) Daily ? ?Labs done today, your results will be available in MyChart, we will contact you for abnormal readings. ? ?Please wear your compression hose daily, place them on as soon as you get up in the morning and remove before you go to bed at night. ? ?Do the following things EVERYDAY: ?Weigh yourself in the morning before breakfast. Write it down and keep it in a log. ?Take your medicines as prescribed ?Eat low salt foods--Limit salt (sodium) to 2000 mg per day.  ?Stay as active as you can everyday ?Limit all fluids for the day to less than 2 liters ? ?Keep follow-up appointment with Dr Einar Gip as scheduled on Friday 4/21 ? ?Your physician recommends that you schedule a follow-up appointment in: 4-6 weeks with Dr Haroldine Laws ? ?If you have any questions or concerns before your next appointment please send [place] a message through [place] or call our office at [phone].   ? ?TO LEAVE A MESSAGE FOR THE NURSE SELECT OPTION 2, PLEASE LEAVE A MESSAGE INCLUDING: ?YOUR NAME ?DATE OF BIRTH ?CALL BACK NUMBER ?REASON FOR CALL**this is important as we prioritize the call backs ? ?YOU WILL RECEIVE A CALL BACK THE SAME DAY AS LONG AS YOU CALL BEFORE 4:00 PM ? ?At the [facility], you and your health needs are our priority. As part of our continuing mission to provide you with exceptional heart care, we have created designated Provider Care Teams. These Care Teams include your primary Cardiologist (physician) and Advanced Practice Providers (APPs- Physician Assistants and Nurse Practitioners) who all work together to provide you with the care you need, when you need it.  ? ?You may see any of the following providers on your designated Care Team at your next follow up: ?Dr Glori Bickers ?Dr Loralie Champagne ?Darrick Grinder, NP ?Lyda Jester, PA ?Jessica Milford,NP ?Marlyce Huge,  PA ?Audry Riles, PharmD ? ? ?Please be sure to bring in all your medications bottles to every appointment.  ? ? ?

## 2021-04-18 NOTE — Telephone Encounter (Signed)
Spoke with patient. Pt aware, agreeable, verbalized understanding.  ?

## 2021-04-18 NOTE — Telephone Encounter (Signed)
Call from lab. K 2.7, per Dr. Haroldine Laws take extra 40mEq KCl now, then start 54mEq daily in the AM.  ? ?Unable to reach patient, left message to call back.  ?

## 2021-04-18 NOTE — Progress Notes (Signed)
? ?ADVANCED HF CLINIC NOTE ? ?Referring Physician: Dr. Einar Gip  ?Primary Care: Nickola Major, MD ?Primary Cardiologist: Adrian Prows ?EP: Caryl Comes ?HF: Dr. Haroldine Laws ? ?HPI: ? ?Miguel Patterson  is a 76 y.o. male from [place] with chronic systolic HF and JSH7W (Creatinine 1.5-1.9) referred by Dr. Einar Gip to discuss candidacy for advanced HF therapies.  ? ?[name] has a fairly limited PMHx. No previous h/o HTN or known CAD.  ? ?He first developed HF symptoms in 2016 and saw his PCP who referred him to Dr. Woody Seller and then Dr. Einar Gip.  ? ?He was referred to Dr. Caryl Comes in 2019 for ICD consideration. According to Dr. Olin Pia note from 10/14/17 initial echo in 2016 had EF 11% with anterior thinning. There were also frequent PVCs (~25% by ECG). At that time he did not have LBBB (IVCD). The question was raised about the possibility of Chag[place] disease or a genetic component. (he is 1 of 17 children and several family members on heart medications). He was referred for a cMRI.  ? ? ?Echo 8/19 read as EF < 15% mod AI/MR (I do not have images to review).  ? ?cMRI on 11/19 with severely dilated LV with normal wall thickness. 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 Underwent BIV ICD CRT insertion on 02/2018. He does not recall ever having a cardiac cath. ? ?We saw him in 11/21 and referred for genetic evaluation for LVNC. He was evaluated by Dr. 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 ? ?Echo 03/22 EF < 20% severe MR moderate to severe TR with Dr. Einar Gip ? ?In 12/22 was feeling much worse. Underwent RHC. -> diuretics increased. Started mexilitene for PVC suppression to try and promote more biv pacing ?RA = 5 ?RV = 52/6 ?PA = 57/22 (36) ?PCW = 25 (v wave 30) ?Fick cardiac output/index = 4.0/2.1 ?PVR = 2.1 WU ?Ao sat =  99% ?PA sat = 62%, 64% ? ?Echo 1/23 EF 25-30% Severe MR. Mod AI/TR.  ? ?Recently struggling with volume overload and LE cellulitis but improved with diuretics and abx. On 04/11/21 had DC-CV with Dr Einar Gip and noticed urine output has been much better since.  ? ?Here for follow-up. Says during the day breathing is perfect. But after about 40 mins develops orthopnea/PND. Gets up and walks around. Taking torsemide 20 bid and metolazone daily. Mild LE edema on R.  ? ? ?ECG: AF with v-pacing at 75 Personally reviewed ?  ? ?CPX 11/21 ? ?FVC 3.17 (71%)      ?FEV1 2.33 (70%)        ?FEV1/FVC 74 (97%)        ?MVV 94 (72%)      ? ? ?BP rest: 86/58 Standing BP: 92/60 BP peak: 122/60  ? ?Peak VO2: 26.9 (103% predicted peak VO2)  ?VE/VCO2 slope:  35  ?OUES: 2.17  ?Peak RER: 1.11  ? ?Ventilatory Threshold: 15.4 (59% predicted or measured peak VO2)  ? ?VE/MVV:  93%  ?O2pulse:  14   (100% predicted O2pulse)  ? ?  ?Past Medical History:  ?Diagnosis Date  ? AICD (automatic cardioverter/defibrillator) present   ? Cardiomegaly   ? CHF (congestive heart failure) ([place])   ? Chronic combined systolic and diastolic heart failure ([place]) 02/10/2018  ? DCM (dilated cardiomyopathy) ([place])   ? Encounter  for adjustment of biventricular implantable cardioverter-defibrillator (ICD) 04/03/2019  ? Fatigue   ? ICD- Biventricular Medtronic ICD Claria Mri [id number] in situ 02/28/18 03/01/2018  ? LBBB (left bundle branch block)   ? Mild tricuspid regurgitation   ? Moderate aortic regurgitation   ? Moderate mitral regurgitation   ? Pericardial effusion   ? INSIGNIFICANT  ? S/P biventricular cardiac pacemaker procedure 02/28/18 with ICD MDT  03/01/2018  ? Short of breath on exertion   ? Sleep apnea   ? Systolic heart failure ([place])   ? Trace pulmonic regurgitation by prior echocardiogram   ? ? ?Past Medical History:  ?Diagnosis Date  ? AICD (automatic cardioverter/defibrillator) present   ? Cardiomegaly   ? CHF (congestive heart failure) ([place])   ? Chronic combined  systolic and diastolic heart failure ([place]) 02/10/2018  ? DCM (dilated cardiomyopathy) ([place])   ? Encounter for adjustment of biventricular implantable cardioverter-defibrillator (ICD) 04/03/2019  ? Fatigue   ? ICD- Biventricular Medtronic ICD Claria Mri [id number] in situ 02/28/18 03/01/2018  ? LBBB (left bundle branch block)   ? Mild tricuspid regurgitation   ? Moderate aortic regurgitation   ? Moderate mitral regurgitation   ? Pericardial effusion   ? INSIGNIFICANT  ? S/P biventricular cardiac pacemaker procedure 02/28/18 with ICD MDT  03/01/2018  ? Short of breath on exertion   ? Sleep apnea   ? Systolic heart failure ([place])   ? Trace pulmonic regurgitation by prior echocardiogram   ? ? ?Current Outpatient Medications  ?Medication Sig Dispense Refill  ? amiodarone (PACERONE) 200 MG tablet Take 200 mg by mouth daily.    ? apixaban (ELIQUIS) 5 MG TABS tablet Take 1 tablet (5 mg total) by mouth 2 (two) times daily. 180 tablet 1  ? carvedilol (COREG) 3.125 MG tablet Take 1 tablet (3.125 mg total) by mouth 2 (two) times daily with a meal. 180 tablet 1  ? magnesium oxide (MAG-OX) 400 MG tablet Take 1 tablet (400 mg total) by mouth 2 (two) times daily. 180 tablet 3  ? metolazone (ZAROXOLYN) 2.5 MG tablet Take 1 tablet (2.5 mg total) by mouth daily as needed. Take one tab daily for worsening shortness of breath/leg swelling (Patient taking differently: Take 2.5 mg by mouth daily. Take one tab daily for worsening shortness of breath/leg swelling) 30 tablet 2  ? pantoprazole (PROTONIX) 40 MG tablet Take 40 mg by mouth daily.    ? polyethylene glycol (MIRALAX / GLYCOLAX) 17 g packet Take 17 g by mouth daily.    ? Potassium Chloride ER 20 MEQ TBCR Take 20 mg by mouth daily. Taking with metolazone    ? torsemide (DEMADEX) 20 MG tablet Take 20 mg by mouth 2 (two) times daily.    ? ?No current facility-administered medications for this encounter.  ? ? ?Allergies  ?Allergen Reactions  ? Losartan Potassium Anaphylaxis  ?  angioedema  ?  Aspirin Other (See Comments)  ?  Stomach pain ?  ? ? ?  ?Social History  ? ?Socioeconomic History  ? Marital status: Divorced  ?  Spouse name: Not on file  ? Number of children: 3  ? Years of education: 66  ? Highest education level: Professional school degree (e.g., MD, DDS, DVM, JD)  ?Occupational History  ? Occupation: Retired  ?  Comment: Retired college professor [employer]  ?Tobacco Use  ? Smoking status: Never  ? Smokeless tobacco: Never  ?Vaping Use  ? Vaping Use: Never used  ?Substance and Sexual Activity  ?  Alcohol use: No  ? Drug use: No  ? Sexual activity: Not Currently  ?Other Topics Concern  ? Not on file  ?Social History Narrative  ? Not on file  ? ?Social Determinants of Health  ? ?Financial Resource Strain: Not on file  ?Food Insecurity: Not on file  ?Transportation Needs: Not on file  ?Physical Activity: Not on file  ?Stress: Not on file  ?Social Connections: Not on file  ?Intimate Partner Violence: Not on file  ? ? ?  ?Family History  ?Problem Relation Age of Onset  ? Hypertension Mother 106  ? Thyroid disease Mother   ? Healthy Father 32  ? ? ?Vitals:  ? 04/18/21 1331  ?BP: 112/78  ?Pulse: 78  ?SpO2: 100%  ?Weight: 71 kg (156 lb 9.6 oz)  ? ? ? ? ?PHYSICAL EXAM: ?General:  Thin weak appearing. No resp difficulty ?HEENT: normal ?Neck: supple. JVP to jaw with prominent v waves  Carotids 2+ bilat; no bruits. No lymphadenopathy or thryomegaly appreciated. ?Cor: PMI nondisplaced. Regular rate & rhythm. 2/6 MR ?Lungs: clear ?Abdomen: soft, nontender, nondistended. No hepatosplenomegaly. No bruits or masses. Good bowel sounds. ?Extremities: no cyanosis, clubbing, rash, 3+ edema on left 2+ on right prominent venous varicosities ?Neuro: alert & orientedx3, cranial nerves grossly intact. moves all 4 extremities w/o difficulty. Affect pleasant ? ? ?ASSESSMENT & PLAN: ? ?1. Acute on Chronic systolic HF due to non-compaction CM ?- cMRI 11/19  LVEF 14% severely dilated LV with normal wall thickness c/w end stage LVNC   ?- No report of cardiac cath but based on MRI (and family history), LVNC seems like it may be the culprit here. He also has a h/o LBBB but his CM predated the development of LBBB so doubt it can be i

## 2021-04-19 ENCOUNTER — Encounter (HOSPITAL_COMMUNITY): Payer: Medicare Other

## 2021-04-19 ENCOUNTER — Telehealth (HOSPITAL_COMMUNITY): Payer: Self-pay | Admitting: Family Medicine

## 2021-04-19 ENCOUNTER — Telehealth (HOSPITAL_COMMUNITY): Payer: Self-pay | Admitting: *Deleted

## 2021-04-19 NOTE — Telephone Encounter (Signed)
Spoke with Dr Bufford Spikes. We will discharge him form cardiac rehab due to his ongoing health issues and bring him back when he is feeling better. Miguel Patterson is agreeable to the plan.Barnet Pall, RN,BSN ?04/19/2021 4:35 PM  ?

## 2021-04-20 ENCOUNTER — Encounter: Payer: Self-pay | Admitting: Cardiology

## 2021-04-20 ENCOUNTER — Other Ambulatory Visit: Payer: Self-pay

## 2021-04-20 ENCOUNTER — Emergency Department (HOSPITAL_COMMUNITY): Payer: Medicare Other

## 2021-04-20 ENCOUNTER — Inpatient Hospital Stay (HOSPITAL_COMMUNITY)
Admission: EM | Admit: 2021-04-20 | Discharge: 2021-04-28 | DRG: 291 | Disposition: A | Discharge status: Home Health | Payer: Medicare Other | Source: Ambulatory Visit | Attending: Student in an Organized Health Care Education/Training Program | Admitting: Student in an Organized Health Care Education/Training Program

## 2021-04-20 ENCOUNTER — Encounter (HOSPITAL_COMMUNITY): Payer: Self-pay

## 2021-04-20 ENCOUNTER — Ambulatory Visit: Payer: Medicare Other | Admitting: Cardiology

## 2021-04-20 VITALS — Resp 17 | Ht 70.0 in

## 2021-04-20 DIAGNOSIS — I083 Combined rheumatic disorders of mitral, aortic and tricuspid valves: Secondary | ICD-10-CM | POA: Diagnosis present

## 2021-04-20 DIAGNOSIS — I5022 Chronic systolic (congestive) heart failure: Secondary | ICD-10-CM

## 2021-04-20 DIAGNOSIS — N179 Acute kidney failure, unspecified: Secondary | ICD-10-CM | POA: Diagnosis not present

## 2021-04-20 DIAGNOSIS — I3139 Other pericardial effusion (noninflammatory): Secondary | ICD-10-CM | POA: Diagnosis not present

## 2021-04-20 DIAGNOSIS — I8393 Asymptomatic varicose veins of bilateral lower extremities: Secondary | ICD-10-CM | POA: Diagnosis present

## 2021-04-20 DIAGNOSIS — Z886 Allergy status to analgesic agent status: Secondary | ICD-10-CM | POA: Diagnosis not present

## 2021-04-20 DIAGNOSIS — Z8249 Family history of ischemic heart disease and other diseases of the circulatory system: Secondary | ICD-10-CM | POA: Diagnosis not present

## 2021-04-20 DIAGNOSIS — N1832 Chronic kidney disease, stage 3b: Secondary | ICD-10-CM | POA: Diagnosis present

## 2021-04-20 DIAGNOSIS — K219 Gastro-esophageal reflux disease without esophagitis: Secondary | ICD-10-CM | POA: Diagnosis present

## 2021-04-20 DIAGNOSIS — I130 Hypertensive heart and chronic kidney disease with heart failure and stage 1 through stage 4 chronic kidney disease, or unspecified chronic kidney disease: Principal | ICD-10-CM | POA: Diagnosis present

## 2021-04-20 DIAGNOSIS — Z888 Allergy status to other drugs, medicaments and biological substances status: Secondary | ICD-10-CM

## 2021-04-20 DIAGNOSIS — Z7901 Long term (current) use of anticoagulants: Secondary | ICD-10-CM | POA: Diagnosis not present

## 2021-04-20 DIAGNOSIS — I5043 Acute on chronic combined systolic (congestive) and diastolic (congestive) heart failure: Secondary | ICD-10-CM | POA: Diagnosis present

## 2021-04-20 DIAGNOSIS — N1831 Chronic kidney disease, stage 3a: Secondary | ICD-10-CM | POA: Diagnosis not present

## 2021-04-20 DIAGNOSIS — Z66 Do not resuscitate: Secondary | ICD-10-CM | POA: Diagnosis present

## 2021-04-20 DIAGNOSIS — I5084 End stage heart failure: Secondary | ICD-10-CM | POA: Diagnosis present

## 2021-04-20 DIAGNOSIS — I5023 Acute on chronic systolic (congestive) heart failure: Secondary | ICD-10-CM | POA: Diagnosis present

## 2021-04-20 DIAGNOSIS — I480 Paroxysmal atrial fibrillation: Secondary | ICD-10-CM | POA: Diagnosis not present

## 2021-04-20 DIAGNOSIS — I4891 Unspecified atrial fibrillation: Secondary | ICD-10-CM | POA: Diagnosis not present

## 2021-04-20 DIAGNOSIS — Z95810 Presence of automatic (implantable) cardiac defibrillator: Secondary | ICD-10-CM | POA: Diagnosis not present

## 2021-04-20 DIAGNOSIS — E876 Hypokalemia: Secondary | ICD-10-CM | POA: Diagnosis present

## 2021-04-20 DIAGNOSIS — I420 Dilated cardiomyopathy: Secondary | ICD-10-CM | POA: Diagnosis present

## 2021-04-20 DIAGNOSIS — K5900 Constipation, unspecified: Secondary | ICD-10-CM | POA: Diagnosis present

## 2021-04-20 DIAGNOSIS — Z79899 Other long term (current) drug therapy: Secondary | ICD-10-CM

## 2021-04-20 DIAGNOSIS — I255 Ischemic cardiomyopathy: Secondary | ICD-10-CM | POA: Diagnosis present

## 2021-04-20 DIAGNOSIS — R570 Cardiogenic shock: Secondary | ICD-10-CM | POA: Diagnosis present

## 2021-04-20 DIAGNOSIS — Z8349 Family history of other endocrine, nutritional and metabolic diseases: Secondary | ICD-10-CM | POA: Diagnosis not present

## 2021-04-20 DIAGNOSIS — I493 Ventricular premature depolarization: Secondary | ICD-10-CM

## 2021-04-20 DIAGNOSIS — Z515 Encounter for palliative care: Secondary | ICD-10-CM | POA: Diagnosis not present

## 2021-04-20 DIAGNOSIS — I509 Heart failure, unspecified: Secondary | ICD-10-CM | POA: Diagnosis present

## 2021-04-20 DIAGNOSIS — N170 Acute kidney failure with tubular necrosis: Secondary | ICD-10-CM | POA: Diagnosis present

## 2021-04-20 DIAGNOSIS — I4819 Other persistent atrial fibrillation: Secondary | ICD-10-CM | POA: Diagnosis present

## 2021-04-20 DIAGNOSIS — I447 Left bundle-branch block, unspecified: Secondary | ICD-10-CM | POA: Diagnosis present

## 2021-04-20 DIAGNOSIS — N189 Chronic kidney disease, unspecified: Secondary | ICD-10-CM | POA: Diagnosis not present

## 2021-04-20 DIAGNOSIS — Z7189 Other specified counseling: Secondary | ICD-10-CM | POA: Diagnosis not present

## 2021-04-20 LAB — TROPONIN I (HIGH SENSITIVITY)
Troponin I (High Sensitivity): 47 ng/L — ABNORMAL HIGH (ref <18)
Troponin I (High Sensitivity): 40 ng/L — ABNORMAL HIGH (ref <18)

## 2021-04-20 LAB — COMPREHENSIVE METABOLIC PANEL
ALT: 27 U/L (ref 0–44)
AST: 40 U/L (ref 15–41)
Albumin: 3.5 g/dL (ref 3.5–5.0)
Alkaline Phosphatase: 131 U/L — ABNORMAL HIGH (ref 38–126)
Anion gap: 15 (ref 5–15)
BUN: 111 mg/dL — ABNORMAL HIGH (ref 8–23)
CO2: 22 mmol/L (ref 22–32)
Calcium: 8.9 mg/dL (ref 8.9–10.3)
Chloride: 96 mmol/L — ABNORMAL LOW (ref 98–111)
Creatinine, Ser: 3.46 mg/dL — ABNORMAL HIGH (ref 0.61–1.24)
GFR, Estimated: 18 mL/min — ABNORMAL LOW (ref >60)
Glucose, Bld: 133 mg/dL — ABNORMAL HIGH (ref 70–99)
Potassium: 3.9 mmol/L (ref 3.5–5.1)
Sodium: 133 mmol/L — ABNORMAL LOW (ref 135–145)
Total Bilirubin: 3.1 mg/dL — ABNORMAL HIGH (ref 0.3–1.2)
Total Protein: 6.9 g/dL (ref 6.5–8.1)

## 2021-04-20 LAB — BASIC METABOLIC PANEL
Anion gap: 15 (ref 5–15)
BUN: 117 mg/dL — ABNORMAL HIGH (ref 8–23)
CO2: 21 mmol/L — ABNORMAL LOW (ref 22–32)
Calcium: 8.6 mg/dL — ABNORMAL LOW (ref 8.9–10.3)
Chloride: 96 mmol/L — ABNORMAL LOW (ref 98–111)
Creatinine, Ser: 3.63 mg/dL — ABNORMAL HIGH (ref 0.61–1.24)
GFR, Estimated: 17 mL/min — ABNORMAL LOW (ref >60)
Glucose, Bld: 180 mg/dL — ABNORMAL HIGH (ref 70–99)
Potassium: 3.6 mmol/L (ref 3.5–5.1)
Sodium: 132 mmol/L — ABNORMAL LOW (ref 135–145)

## 2021-04-20 LAB — CBC WITH DIFFERENTIAL/PLATELET
Abs Immature Granulocytes: 0.03 10*3/uL (ref 0.00–0.07)
Basophils Absolute: 0.1 10*3/uL (ref 0.0–0.1)
Basophils Relative: 1 %
Eosinophils Absolute: 0 10*3/uL (ref 0.0–0.5)
Eosinophils Relative: 0 %
HCT: 42.2 % (ref 39.0–52.0)
Hemoglobin: 14 g/dL (ref 13.0–17.0)
Immature Granulocytes: 0 %
Lymphocytes Relative: 11 %
Lymphs Abs: 0.8 10*3/uL (ref 0.7–4.0)
MCH: 29.9 pg (ref 26.0–34.0)
MCHC: 33.2 g/dL (ref 30.0–36.0)
MCV: 90.2 fL (ref 80.0–100.0)
Monocytes Absolute: 0.5 10*3/uL (ref 0.1–1.0)
Monocytes Relative: 7 %
Neutro Abs: 6.3 10*3/uL (ref 1.7–7.7)
Neutrophils Relative %: 81 %
Platelets: 142 10*3/uL — ABNORMAL LOW (ref 150–400)
RBC: 4.68 MIL/uL (ref 4.22–5.81)
RDW: 20.1 % — ABNORMAL HIGH (ref 11.5–15.5)
WBC: 7.7 10*3/uL (ref 4.0–10.5)
nRBC: 0 % (ref 0.0–0.2)

## 2021-04-20 LAB — MAGNESIUM
Magnesium: 3.2 mg/dL — ABNORMAL HIGH (ref 1.7–2.4)
Magnesium: 3.1 mg/dL — ABNORMAL HIGH (ref 1.7–2.4)

## 2021-04-20 LAB — BRAIN NATRIURETIC PEPTIDE: B Natriuretic Peptide: 1925.8 pg/mL — ABNORMAL HIGH (ref 0.0–100.0)

## 2021-04-20 LAB — LACTIC ACID, PLASMA: Lactic Acid, Venous: 2.6 mmol/L (ref 0.5–1.9)

## 2021-04-20 MED ORDER — APIXABAN 5 MG PO TABS
5.0000 mg | ORAL_TABLET | Freq: Two times a day (BID) | ORAL | Status: DC
  Administered 2021-04-20 – 2021-04-28 (×16): 5 mg via ORAL
  Filled 2021-04-20 (×16): qty 1

## 2021-04-20 MED ORDER — FUROSEMIDE 10 MG/ML IJ SOLN
80.0000 mg | Freq: Three times a day (TID) | INTRAMUSCULAR | Status: DC
Start: 2021-04-20 — End: 2021-04-22
  Administered 2021-04-20 – 2021-04-22 (×5): 80 mg via INTRAVENOUS
  Filled 2021-04-20 (×5): qty 8

## 2021-04-20 MED ORDER — DOBUTAMINE IN D5W 4-5 MG/ML-% IV SOLN
2.5000 ug/kg/min | INTRAVENOUS | Status: DC
  Administered 2021-04-20 – 2021-04-22 (×2): 5 ug/kg/min via INTRAVENOUS
  Administered 2021-04-24 – 2021-04-27 (×2): 2.5 ug/kg/min via INTRAVENOUS
  Filled 2021-04-20 (×5): qty 250

## 2021-04-20 MED ORDER — POTASSIUM CHLORIDE CRYS ER 20 MEQ PO TBCR
20.0000 meq | EXTENDED_RELEASE_TABLET | Freq: Two times a day (BID) | ORAL | Status: DC
  Administered 2021-04-20: 20 meq via ORAL

## 2021-04-20 MED ORDER — POTASSIUM CHLORIDE ER 10 MEQ PO TBCR
20.0000 meq | EXTENDED_RELEASE_TABLET | ORAL | Status: DC
  Filled 2021-04-20 (×6): qty 2

## 2021-04-20 MED ORDER — AMIODARONE LOAD VIA INFUSION
150.0000 mg | Freq: Once | INTRAVENOUS | Status: AC
  Administered 2021-04-20: 150 mg via INTRAVENOUS
  Filled 2021-04-20: qty 83.34

## 2021-04-20 MED ORDER — PANTOPRAZOLE SODIUM 40 MG PO TBEC
40.0000 mg | DELAYED_RELEASE_TABLET | Freq: Every day | ORAL | Status: DC
  Administered 2021-04-20 – 2021-04-27 (×8): 40 mg via ORAL
  Filled 2021-04-20 (×8): qty 1

## 2021-04-20 MED ORDER — METOLAZONE 5 MG PO TABS
10.0000 mg | ORAL_TABLET | Freq: Every day | ORAL | Status: DC
  Administered 2021-04-20 – 2021-04-21 (×2): 10 mg via ORAL
  Filled 2021-04-20 (×3): qty 2

## 2021-04-20 MED ORDER — POLYETHYLENE GLYCOL 3350 17 G PO PACK
17.0000 g | PACK | Freq: Every day | ORAL | Status: DC
  Administered 2021-04-21 – 2021-04-22 (×2): 17 g via ORAL
  Filled 2021-04-20 (×2): qty 1

## 2021-04-20 MED ORDER — POTASSIUM CHLORIDE ER 10 MEQ PO TBCR: 10.0000 meq | EXTENDED_RELEASE_TABLET | Freq: Three times a day (TID) | ORAL | Status: DC

## 2021-04-20 MED ORDER — FUROSEMIDE 10 MG/ML IJ SOLN
40.0000 mg | Freq: Three times a day (TID) | INTRAMUSCULAR | Status: DC
Start: 2021-04-20 — End: 2021-04-20
  Administered 2021-04-20: 40 mg via INTRAVENOUS
  Filled 2021-04-20: qty 4

## 2021-04-20 MED ORDER — AMIODARONE HCL IN DEXTROSE 360-4.14 MG/200ML-% IV SOLN
60.0000 mg/h | INTRAVENOUS | Status: AC
  Administered 2021-04-20 (×2): 60 mg/h via INTRAVENOUS
  Filled 2021-04-20 (×2): qty 200

## 2021-04-20 MED ORDER — AMIODARONE HCL IN DEXTROSE 360-4.14 MG/200ML-% IV SOLN: 30.0000 mg/h | INTRAVENOUS | Status: DC

## 2021-04-20 NOTE — ED Provider Notes (Signed)
?[facility] ?Provider Note ? ? ?CSN: [id number] ?Arrival date & time: 04/20/21  1136 ? ?  ? ?History ? ?Chief Complaint  ?Patient presents with  ? Shortness of Breath  ? ? ?Miguel Patterson is a 76 y.o. male.  Presented to the emergency department with concern for shortness of breath.  Patient has history of severe heart failure, ischemic cardiomyopathy, followed closely by both Dr. Einar Gip and Dr. Karena Addison.  Over the last couple weeks has been having worsening shortness of breath.  Heart failure clinic had increased his diuretics but this has not helped his breathing.  Does feel like his leg swelling has gotten somewhat better.  Notably had cardioversion a couple weeks ago but he is back in atrial fibrillation.  Patient followed up with Dr. Einar Gip today and due to worsening symptoms was advised coming to ER for further evaluation. ? ?Completed extensive chart review, reviewed recent cardiology notes from [place], [place].  ? ?HPI ? ?  ? ?Home Medications ?Prior to Admission medications   ?Medication Sig Start Date End Date Taking? Authorizing Provider  ?amiodarone (PACERONE) 200 MG tablet Take 200 mg by mouth daily.   Yes [provider]  ?apixaban (ELIQUIS) 5 MG TABS tablet Take 1 tablet (5 mg total) by mouth 2 (two) times daily. 02/06/21  Yes Adrian Prows, MD  ?carvedilol (COREG) 3.125 MG tablet Take 1 tablet (3.125 mg total) by mouth 2 (two) times daily with a meal. 07/18/20  Yes Adrian Prows, MD  ?magnesium oxide (MAG-OX) 400 MG tablet Take 1 tablet (400 mg total) by mouth 2 (two) times daily. 03/23/21  Yes Cantwell, Celeste C, PA-C  ?metolazone (ZAROXOLYN) 2.5 MG tablet Take 1 tablet (2.5 mg total) by mouth daily as needed. Take one tab daily for worsening shortness of breath/leg swelling ?Patient taking differently: Take 2.5 mg by mouth daily. Take one tab daily for worsening shortness of breath/leg swelling 01/18/21 04/20/21 Yes Adrian Prows, MD  ?pantoprazole (PROTONIX)  40 MG tablet Take 40 mg by mouth at bedtime.   Yes [provider]  ?polyethylene glycol (MIRALAX / GLYCOLAX) 17 g packet Take 17 g by mouth 2 (two) times daily.   Yes [provider]  ?Potassium Chloride ER 20 MEQ TBCR Take 60 mEq by mouth daily. Taking with metolazone ?Patient taking differently: Take 20 mEq by mouth in the morning, at noon, and at bedtime. Taking with metolazone 04/18/21  Yes Bensimhon, Shaune Pascal, MD  ?spironolactone (ALDACTONE) 25 MG tablet Take 0.5 tablets (12.5 mg total) by mouth daily. 04/18/21  Yes Bensimhon, Shaune Pascal, MD  ?torsemide (DEMADEX) 20 MG tablet Take 2 tablets (40 mg total) by mouth 2 (two) times daily. 04/18/21  Yes Bensimhon, Shaune Pascal, MD  ?   ? ?Allergies    ?Losartan potassium and Aspirin   ? ?Review of Systems   ?Review of Systems  ?Constitutional:  Negative for chills and fever.  ?HENT:  Negative for ear pain and sore throat.   ?Eyes:  Negative for pain and visual disturbance.  ?Respiratory:  Positive for shortness of breath. Negative for cough.   ?Cardiovascular:  Positive for leg swelling. Negative for chest pain and palpitations.  ?Gastrointestinal:  Negative for abdominal pain and vomiting.  ?Genitourinary:  Negative for dysuria and hematuria.  ?Musculoskeletal:  Negative for arthralgias and back pain.  ?Skin:  Negative for color change and rash.  ?Neurological:  Negative for seizures and syncope.  ?All other systems reviewed and are negative. ? ?  Physical Exam ?Updated Vital Signs ?BP 102/74   Pulse 73   Temp (!) 97.5 ?F (36.4 ?C) (Oral)   Resp 17   Ht 5\' 10"  (1.778 m)   Wt 71 kg   SpO2 100%   BMI 22.46 kg/m?  ?Physical Exam ?Vitals and nursing note reviewed.  ?Constitutional:   ?   General: He is not in acute distress. ?   Appearance: He is well-developed.  ?HENT:  ?   Head: Normocephalic and atraumatic.  ?Eyes:  ?   Conjunctiva/sclera: Conjunctivae normal.  ?Neck:  ?   Vascular: JVD present.  ?   Comments: Distended neck veins ?Cardiovascular:  ?    Rate and Rhythm: Normal rate and regular rhythm.  ?   Heart sounds: No murmur heard. ?Pulmonary:  ?   Comments: Mild tachypnea, somewhat diminished at bases ?Abdominal:  ?   Palpations: Abdomen is soft.  ?   Tenderness: There is no abdominal tenderness.  ?Musculoskeletal:     ?   General: No swelling.  ?   Cervical back: Neck supple.  ?   Comments: No significant pitting edema noted in his lower extremities  ?Skin: ?   General: Skin is warm and dry.  ?   Capillary Refill: Capillary refill takes less than 2 seconds.  ?Neurological:  ?   Mental Status: He is alert.  ?Psychiatric:     ?   Mood and Affect: Mood normal.  ? ? ?ED Results / Procedures / Treatments   ?Labs ?(all labs ordered are listed, but only abnormal results are displayed) ?Labs Reviewed  ?CBC WITH DIFFERENTIAL/PLATELET - Abnormal; Notable for the following components:  ?    Result Value  ? RDW 20.1 (*)   ? Platelets 142 (*)   ? All other components within normal limits  ?COMPREHENSIVE METABOLIC PANEL - Abnormal; Notable for the following components:  ? Sodium 133 (*)   ? Chloride 96 (*)   ? Glucose, Bld 133 (*)   ? BUN 111 (*)   ? Creatinine, Ser 3.46 (*)   ? Alkaline Phosphatase 131 (*)   ? Total Bilirubin 3.1 (*)   ? GFR, Estimated 18 (*)   ? All other components within normal limits  ?BRAIN NATRIURETIC PEPTIDE - Abnormal; Notable for the following components:  ? B Natriuretic Peptide 1,925.8 (*)   ? All other components within normal limits  ?MAGNESIUM - Abnormal; Notable for the following components:  ? Magnesium 3.2 (*)   ? All other components within normal limits  ?TROPONIN I (HIGH SENSITIVITY) - Abnormal; Notable for the following components:  ? Troponin I (High Sensitivity) 47 (*)   ? All other components within normal limits  ?TROPONIN I (HIGH SENSITIVITY) - Abnormal; Notable for the following components:  ? Troponin I (High Sensitivity) 40 (*)   ? All other components within normal limits  ? ? ?EKG ?EKG Interpretation ? ?Date/Time:  Thursday  April 20 2021 11:44:56 EDT ?Ventricular Rate:  88 ?PR Interval:    ?QRS Duration: 188 ?QT Interval:  507 ?QTC Calculation: 614 ?R Axis:   -29 ?Text Interpretation: Atrial fibrillation Nonspecific IVCD with LAD Abnormal lateral Q waves ST depression V1-V3, suggest recording posterior leads Confirmed by Madalyn Rob [phone]) on 04/20/2021 11:48:50 AM ? ?Radiology ?DG Chest Portable 1 View ? ?Result Date: 04/20/2021 ?CLINICAL DATA:  Shortness of breath EXAM: PORTABLE CHEST 1 VIEW COMPARISON:  Previous studies including the examination of 12/06/2020 FINDINGS: Transverse diameter of heart is markedly increased. Pacemaker/defibrillator battery is  seen in the left infraclavicular region. Biventricular pacer leads are noted in place. There are no signs of pulmonary edema or focal pulmonary consolidation. There is no pleural effusion or pneumothorax. IMPRESSION: Cardiomegaly. There are no signs of pulmonary edema or focal pulmonary consolidation. Electronically Signed   By: Elmer Picker M.D.   On: 04/20/2021 12:39   ? ?Procedures ?Procedures  ? ? ?Medications Ordered in ED ?Medications  ?metolazone (ZAROXOLYN) tablet 10 mg (10 mg Oral Given 04/20/21 1435)  ?furosemide (LASIX) injection 40 mg (40 mg Intravenous Given 04/20/21 1433)  ? ? ?ED Course/ Medical Decision Making/ A&P ?  ?                        ?Medical Decision Making ?Amount and/or Complexity of Data Reviewed ?Labs: ordered. ?Radiology: ordered. ? ?Risk ?Prescription drug management. ?Decision regarding hospitalization. ? ? ?76 year old gentleman presenting to the emergency room with concern for worsening shortness of breath.  Patient has severe heart failure, followed by [facility] and heart failure clinic.  Discussed the case with heart failure team and they discussed with Dr. Einar Gip, I discussed with Dr. Einar Gip who will come consult on the patient.  Lab work concerning for worsening BNP, worsening creatinine.  Concern for decompensated heart failure, fluid  overload state.  Dr. Einar Gip advises starting Lasix 40 mg every 8, metolazone 10 mg daily.  Request medicine admit as primary.  I discussed the case with the internal medicine residents who will come evaluate an

## 2021-04-20 NOTE — ED Triage Notes (Signed)
Pt arrived via [facility] from cardiologist office for sudden onset of SOB. Per EMS, Sa02 at dr office 87% on RA. Per, EMS, RLL is diminished. Pt is tachypneic and unable to speak in full sentences w/o getting winded. Pt is A&Ox4.   ?

## 2021-04-20 NOTE — H&P (Addendum)
?Date: 04/20/2021     ?     ?     ?Patient Name:  Miguel Patterson MRN: [id number]  ?DOB: 1945-09-21 Age / Sex: 76 y.o., male   ?PCP: Miguel Major, Patterson    ?     ?Medical Service: Internal Medicine Teaching Service    ?     ?Attending Physician: Dr. Evette Patterson, Miguel Patterson, *    ?Miguel Patterson Pager: GG [phone]  ?Miguel Patterson Pager: VK [phone]  ?     ?After Hours (After 5p/  Miguel Contact Pager: [phone]  ?weekends / holidays): Miguel Contact Pager: [phone]  ? ?SUBJECTIVE  ? ?Chief Complaint: Worsening Dyspnea ? ?History of Present Illness:  ? ?Miguel Patterson is a 76 year old gentleman living with chronic systolic heart failure with EF 15% secondary to nonischemic cardiomyopathy with pacemaker in place, atrial fibrillation, and CKD 3B who was sent to the [facility] emergency department by his primary cardiologist Miguel Patterson for admission.  Patient has been having worsening dyspnea at rest over the past 4 days.  He has been sleeping on 3 pillows without improvement, usually sleeping on none.  He also endorses waking up in the middle night gasping for air.  He has noticed worsening swelling in his lower extremities as well as generalized weakness and decreased appetite.  He was seen by Miguel Patterson 2 days ago and was instructed to increase his torsemide and metolazone due to swelling.  He was also started on spironolactone at that time.  Swelling improved, however patient endorses continued worsening of his breathing.  He currently feels short of breath when attempting to speak in full sentences.  He also gets short of breath walking approximately 50 feet to his mailbox at home.  He is no longer able to go upstairs.  At baseline he is high functioning and was normally able to exercise and workout daily even with his advanced heart failure. ? ?He has had periodic dyspnea since September 2016 and was diagnosed with heart failure in 2016 which then EF of 11%.he is  followed closely by his primary cardiologist as well as Miguel Patterson of heart failure team.  Most recently he developed paroxysmal atrial fibrillation and has since had more days where he felt bad than good.  He recently underwent cardioversion on 4/11, but has since returned to A-fib. ? ?He denies recent illnesses, changes in diet, or missing any medication doses.  He denies chest pain.  He believes his decreased appetite is Miguel to the burning he has in his lower stomach.  Abdominal burning improves with taking 2 doses of his pantoprazole.  He denies night sweats but believe he has had weight loss over the past few months.  States he was told by his PCP that he was losing weight even prior to September 2016. ? ? ?Meds: Confirmed with patient and son at bedside ?Current Meds  ?Medication Sig  ? amiodarone (PACERONE) 200 MG tablet Take 200 mg by mouth daily.  ? apixaban (ELIQUIS) 5 MG TABS tablet Take 1 tablet (5 mg total) by mouth 2 (two) times daily.  ? carvedilol (COREG) 3.125 MG tablet Take 1 tablet (3.125 mg total) by mouth 2 (two) times daily with a meal.  ? magnesium oxide (MAG-OX) 400 MG tablet Take 1 tablet (400 mg total) by mouth 2 (two) times daily.  ? metolazone (ZAROXOLYN) 2.5 MG tablet Take 1 tablet (2.5 mg total) by mouth daily as needed. Take one tab  daily for worsening shortness of breath/leg swelling (Patient taking differently: Take 2.5 mg by mouth daily. Take one tab daily for worsening shortness of breath/leg swelling)  ? pantoprazole (PROTONIX) 40 MG tablet Take 40 mg by mouth at bedtime.  ? polyethylene glycol (MIRALAX / GLYCOLAX) 17 g packet Take 17 g by mouth 2 (two) times daily.  ? Potassium Chloride ER 20 MEQ TBCR Take 60 mEq by mouth daily. Taking with metolazone (Patient taking differently: Take 20 mEq by mouth in the morning, at noon, and at bedtime. Taking with metolazone)  ? spironolactone (ALDACTONE) 25 MG tablet Take 0.5 tablets (12.5 mg total) by mouth daily.  ? torsemide  (DEMADEX) 20 MG tablet Take 2 tablets (40 mg total) by mouth 2 (two) times daily.  ? ? ?Past Medical History:  ?Diagnosis Date  ? AICD (automatic cardioverter/defibrillator) present   ? Cardiomegaly   ? CHF (congestive heart failure) ([place])   ? Chronic combined systolic and diastolic heart failure ([place]) 02/10/2018  ? DCM (dilated cardiomyopathy) ([place])   ? Encounter for adjustment of biventricular implantable cardioverter-defibrillator (ICD) 04/03/2019  ? Fatigue   ? ICD- Biventricular Medtronic ICD Claria Mri [id number] in situ 02/28/18 03/01/2018  ? LBBB (left bundle branch block)   ? Mild tricuspid regurgitation   ? Moderate aortic regurgitation   ? Moderate mitral regurgitation   ? Pericardial effusion   ? INSIGNIFICANT  ? S/P biventricular cardiac pacemaker procedure 02/28/18 with ICD MDT  03/01/2018  ? Short of breath on exertion   ? Sleep apnea   ? Systolic heart failure ([place])   ? Trace pulmonic regurgitation by prior echocardiogram   ?Constipation ? ?Past Surgical History:  ?Procedure Laterality Date  ? BIV ICD INSERTION CRT-D N/A 02/28/2018  ? Procedure: BIV ICD INSERTION CRT-D;  Surgeon: Deboraha Sprang, Patterson;  Location: [place] [facility];  Service: Cardiovascular;  Laterality: N/A;  ? CARDIOVERSION N/A 04/11/2021  ? Procedure: CARDIOVERSION;  Surgeon: Adrian Prows, Patterson;  Location: [place];  Service: Cardiovascular;  Laterality: N/A;  ? LEG SURGERY Left 2000  ? DUE TO BROKE LEG  ? RIGHT HEART CATH N/A 12/14/2020  ? Procedure: RIGHT HEART CATH;  Surgeon: Jolaine Artist, Patterson;  Location: [place] [facility];  Service: Cardiovascular;  Laterality: N/A;  ? 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 Previously professor at [employer] ?Support: Son and family in the area to help ?Level of Function: Able to perform ADL/IADL ?PCP: Miguel Patterson of [place] ?Substances: He denies tobacco use, alcohol use, or other substance use ? ?Family History:  ?Brother with arrhythmia ?Family history  of Chagas disease? ? ?Allergies: ?Allergies as of 04/20/2021 - Review Complete 04/20/2021  ?Allergen Reaction Noted  ? Losartan potassium Anaphylaxis 08/29/2020  ? Aspirin Other (See Comments) 03/21/2015  ? ?Review of Systems: ?A complete ROS was negative except as per HPI.  ? ?OBJECTIVE:  ? ?Physical Exam: ?Blood pressure 99/68, pulse 78, temperature (!) 97.5 ?F (36.4 ?C), temperature source Oral, resp. rate 11, height 5\' 10"  (1.778 m), weight 71 kg, SpO2 100 %.  ?Constitutional: Elderly appearing, chronically ill ?Neck: supple JVD present to angle of mandible. ?Cardiovascular: regular rate and rhythm, no m/r/g, soft heart sounds, JVD present, trace pitting edema bilaterally, venous distention in bilateral legs, cool extremities ?Pulmonary/Chest: normal work of breathing on room air, lungs clear to auscultation bilaterally, absent crackles ?Abdominal: soft, non-tender, non-distended ?MSK: normal bulk and tone ?Neurological: alert & oriented x 3, 5/5 strength in bilateral  upper and lower extremities, normal gait ?Skin: Cool and dry ?Psych: Mood and affect appropriate ? ?Labs: ?CBC ?   ?Component Value Date/Time  ? WBC 7.7 04/20/2021 1155  ? RBC 4.68 04/20/2021 1155  ? HGB 14.0 04/20/2021 1155  ? HGB 13.2 12/06/2020 1142  ? HCT 42.2 04/20/2021 1155  ? HCT 39.7 12/06/2020 1142  ? PLT 142 (L) 04/20/2021 1155  ? PLT 150 12/06/2020 1142  ? MCV 90.2 04/20/2021 1155  ? MCV 87 12/06/2020 1142  ? MCH 29.9 04/20/2021 1155  ? MCHC 33.2 04/20/2021 1155  ? RDW 20.1 (H) 04/20/2021 1155  ? RDW 13.3 12/06/2020 1142  ? LYMPHSABS 0.8 04/20/2021 1155  ? MONOABS 0.5 04/20/2021 1155  ? EOSABS 0.0 04/20/2021 1155  ? BASOSABS 0.1 04/20/2021 1155  ?  ? ?CMP  ?   ?Component Value Date/Time  ? NA 133 (L) 04/20/2021 1155  ? NA 141 12/02/2020 1320  ? K 3.9 04/20/2021 1155  ? CL 96 (L) 04/20/2021 1155  ? CO2 22 04/20/2021 1155  ? GLUCOSE 133 (H) 04/20/2021 1155  ? BUN 111 (H) 04/20/2021 1155  ? BUN 54 (H) 12/02/2020 1320  ? CREATININE 3.46 (H)  04/20/2021 1155  ? CALCIUM 8.9 04/20/2021 1155  ? PROT 6.9 04/20/2021 1155  ? PROT 7.1 03/20/2019 0804  ? ALBUMIN 3.5 04/20/2021 1155  ? ALBUMIN 4.5 03/20/2019 0804  ? AST 40 04/20/2021 1155  ? ALT 27 04/21/18

## 2021-04-20 NOTE — Progress Notes (Deleted)
?Date: 04/20/2021     ?     ?     ?Patient Name:  Miguel Patterson MRN: [id number]  ?DOB: 09/21/1945 Age / Sex: 76 y.o., male   ?PCP: Nickola Major, MD    ?     ?Medical Service: Internal Medicine Teaching Service    ?     ?Attending Physician: Dr. Evette Doffing, Mallie Mussel, *    ?First Contact: Delene Ruffini, MD Pager: GG [phone]  ?Second Contact: Sanjuana Letters, DO Pager: VK [phone]  ?     ?After Hours (After 5p/  First Contact Pager: [phone]  ?weekends / holidays): Second Contact Pager: [phone]  ? ?SUBJECTIVE  ? ?Chief Complaint: Worsening Dyspnea ? ?History of Present Illness:  ? ?Miguel Patterson is a 76 year old gentleman living with chronic systolic heart failure with EF 15% secondary to nonischemic cardiomyopathy with pacemaker in place, atrial fibrillation, and CKD 3B who was sent to the [facility] emergency department by his primary cardiologist Dr. Einar Gip for admission.  Patient has been having worsening dyspnea at rest over the past 4 days.  He has been sleeping on 3 pillows without improvement, usually sleeping on none.  He also endorses waking up in the middle night gasping for air.  He has noticed worsening swelling in his lower extremities as well as generalized weakness and decreased appetite.  He was seen by Dr. Haroldine Laws 2 days ago and was instructed to increase his torsemide and metolazone due to swelling.  He was also started on spironolactone at that time.  Swelling improved, however patient endorses continued worsening of his breathing.  He currently feels short of breath when attempting to speak in full sentences.  He also gets short of breath walking approximately 50 feet to his mailbox at home.  He is no longer able to go upstairs.  At baseline he is high functioning and was normally able to exercise and workout daily even with his advanced heart failure. ? ?He has had periodic dyspnea since September 2016 and was diagnosed with heart failure in 2016 which then EF of 11%.he is  followed closely by his primary cardiologist as well as Dr. Haroldine Laws of heart failure team.  Most recently he developed paroxysmal atrial fibrillation and has since had more days where he felt bad than good.  He recently underwent cardioversion on 4/11, but has since returned to A-fib. ? ?He denies recent illnesses, changes in diet, or missing any medication doses.  He denies chest pain.  He believes his decreased appetite is second to the burning he has in his lower stomach.  Abdominal burning improves with taking 2 doses of his pantoprazole.  He denies night sweats but believe he has had weight loss over the past few months.  States he was told by his PCP that he was losing weight even prior to September 2016. ? ? ?Meds: Confirmed with patient and son at bedside ?Current Meds  ?Medication Sig  ? amiodarone (PACERONE) 200 MG tablet Take 200 mg by mouth daily.  ? apixaban (ELIQUIS) 5 MG TABS tablet Take 1 tablet (5 mg total) by mouth 2 (two) times daily.  ? carvedilol (COREG) 3.125 MG tablet Take 1 tablet (3.125 mg total) by mouth 2 (two) times daily with a meal.  ? magnesium oxide (MAG-OX) 400 MG tablet Take 1 tablet (400 mg total) by mouth 2 (two) times daily.  ? metolazone (ZAROXOLYN) 2.5 MG tablet Take 1 tablet (2.5 mg total) by mouth daily as needed. Take one tab  daily for worsening shortness of breath/leg swelling (Patient taking differently: Take 2.5 mg by mouth daily. Take one tab daily for worsening shortness of breath/leg swelling)  ? pantoprazole (PROTONIX) 40 MG tablet Take 40 mg by mouth at bedtime.  ? polyethylene glycol (MIRALAX / GLYCOLAX) 17 g packet Take 17 g by mouth 2 (two) times daily.  ? Potassium Chloride ER 20 MEQ TBCR Take 60 mEq by mouth daily. Taking with metolazone (Patient taking differently: Take 20 mEq by mouth in the morning, at noon, and at bedtime. Taking with metolazone)  ? spironolactone (ALDACTONE) 25 MG tablet Take 0.5 tablets (12.5 mg total) by mouth daily.  ? torsemide  (DEMADEX) 20 MG tablet Take 2 tablets (40 mg total) by mouth 2 (two) times daily.  ? ? ?Past Medical History:  ?Diagnosis Date  ? AICD (automatic cardioverter/defibrillator) present   ? Cardiomegaly   ? CHF (congestive heart failure) ([place])   ? Chronic combined systolic and diastolic heart failure ([place]) 02/10/2018  ? DCM (dilated cardiomyopathy) ([place])   ? Encounter for adjustment of biventricular implantable cardioverter-defibrillator (ICD) 04/03/2019  ? Fatigue   ? ICD- Biventricular Medtronic ICD Claria Mri [id number] in situ 02/28/18 03/01/2018  ? LBBB (left bundle branch block)   ? Mild tricuspid regurgitation   ? Moderate aortic regurgitation   ? Moderate mitral regurgitation   ? Pericardial effusion   ? INSIGNIFICANT  ? S/P biventricular cardiac pacemaker procedure 02/28/18 with ICD MDT  03/01/2018  ? Short of breath on exertion   ? Sleep apnea   ? Systolic heart failure ([place])   ? Trace pulmonic regurgitation by prior echocardiogram   ?Constipation ? ?Past Surgical History:  ?Procedure Laterality Date  ? BIV ICD INSERTION CRT-D N/A 02/28/2018  ? Procedure: BIV ICD INSERTION CRT-D;  Surgeon: Deboraha Sprang, MD;  Location: [place] [facility];  Service: Cardiovascular;  Laterality: N/A;  ? CARDIOVERSION N/A 04/11/2021  ? Procedure: CARDIOVERSION;  Surgeon: Adrian Prows, MD;  Location: [place];  Service: Cardiovascular;  Laterality: N/A;  ? LEG SURGERY Left 2000  ? DUE TO BROKE LEG  ? RIGHT HEART CATH N/A 12/14/2020  ? Procedure: RIGHT HEART CATH;  Surgeon: Jolaine Artist, MD;  Location: [place] [facility];  Service: Cardiovascular;  Laterality: N/A;  ? 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 Previously professor at [employer] ?Support: Son and family in the area to help ?Level of Function: Able to perform ADL/IADL ?PCP: Dr. Daron Offer of [place] ?Substances: He denies tobacco use, alcohol use, or other substance use ? ?Family History:  ?Brother with arrhythmia ?Family history  of Chagas disease? ? ?Allergies: ?Allergies as of 04/20/2021 - Review Complete 04/20/2021  ?Allergen Reaction Noted  ? Losartan potassium Anaphylaxis 08/29/2020  ? Aspirin Other (See Comments) 03/21/2015  ? ?Review of Systems: ?A complete ROS was negative except as per HPI.  ? ?OBJECTIVE:  ? ?Physical Exam: ?Blood pressure 99/68, pulse 78, temperature (!) 97.5 ?F (36.4 ?C), temperature source Oral, resp. rate 11, height 5\' 10"  (1.778 m), weight 71 kg, SpO2 100 %.  ?Constitutional: Elderly appearing, chronically ill ?Neck: supple JVD present to angle of mandible. ?Cardiovascular: regular rate and rhythm, no m/r/g, soft heart sounds, JVD present, trace pitting edema bilaterally, venous distention in bilateral legs, cool extremities ?Pulmonary/Chest: normal work of breathing on room air, lungs clear to auscultation bilaterally, absent crackles ?Abdominal: soft, non-tender, non-distended ?MSK: normal bulk and tone ?Neurological: alert & oriented x 3, 5/5 strength in bilateral  upper and lower extremities, normal gait ?Skin: Cool and dry ?Psych: Mood and affect appropriate ? ?Labs: ?CBC ?   ?Component Value Date/Time  ? WBC 7.7 04/20/2021 1155  ? RBC 4.68 04/20/2021 1155  ? HGB 14.0 04/20/2021 1155  ? HGB 13.2 12/06/2020 1142  ? HCT 42.2 04/20/2021 1155  ? HCT 39.7 12/06/2020 1142  ? PLT 142 (L) 04/20/2021 1155  ? PLT 150 12/06/2020 1142  ? MCV 90.2 04/20/2021 1155  ? MCV 87 12/06/2020 1142  ? MCH 29.9 04/20/2021 1155  ? MCHC 33.2 04/20/2021 1155  ? RDW 20.1 (H) 04/20/2021 1155  ? RDW 13.3 12/06/2020 1142  ? LYMPHSABS 0.8 04/20/2021 1155  ? MONOABS 0.5 04/20/2021 1155  ? EOSABS 0.0 04/20/2021 1155  ? BASOSABS 0.1 04/20/2021 1155  ?  ? ?CMP  ?   ?Component Value Date/Time  ? NA 133 (L) 04/20/2021 1155  ? NA 141 12/02/2020 1320  ? K 3.9 04/20/2021 1155  ? CL 96 (L) 04/20/2021 1155  ? CO2 22 04/20/2021 1155  ? GLUCOSE 133 (H) 04/20/2021 1155  ? BUN 111 (H) 04/20/2021 1155  ? BUN 54 (H) 12/02/2020 1320  ? CREATININE 3.46 (H)  04/20/2021 1155  ? CALCIUM 8.9 04/20/2021 1155  ? PROT 6.9 04/20/2021 1155  ? PROT 7.1 03/20/2019 0804  ? ALBUMIN 3.5 04/20/2021 1155  ? ALBUMIN 4.5 03/20/2019 0804  ? AST 40 04/20/2021 1155  ? ALT 27 04/21/18

## 2021-04-20 NOTE — Progress Notes (Signed)
? ?Primary Physician:  Nickola Major, MD ? ? ?Patient ID: Miguel Patterson, male    DOB: Feb 10, 1945, 76 y.o.   MRN: [id number]  ? ?Chief Complaint  ?Patient presents with  ? Post Cardioversion  ? Follow-up  ? ?HPI  ? ? Miguel Patterson  is a 76 y.o. 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 Caryl Comes for nonischemic cardiomyopathy.  ? ?Patient is also being followed by Dr. Haroldine Laws [facility].  Due to frequent PVCs he was started on mexiletine with reduction in the PVC burden from 30% to 11% however in view of persistent atrial fibrillation and recurrence of congestive heart failure, he was transitioned to amiodarone. ? ?Patient underwent direct-current cardioversion on 04/11/2021 to sinus rhythm.  Unfortunately he was recently seen by Dr. Christene Slates 04/18/2021 and he was back in atrial fibrillation, he has had recurrence of decompensated heart failure.  When I saw him during cardioversion, his leg edema had completely resolved, his dyspnea had improved.  He now presents for follow-up of CHF, he is being closely monitored by both Dr. Pierre Bali and also myself.  I had seen him on 04/03/2021 where patient was in florid heart failure and also had developed cellulitis of his bilateral shin.  He was started on antibiotics as well and during cardioversion, patient was well compensated with complete resolution of leg edema and resolution of cellulitis. ? ?He now presents for follow-up of heart failure. ? ? ?Past Medical History:  ?Diagnosis Date  ? AICD (automatic cardioverter/defibrillator) present   ? Cardiomegaly   ? CHF (congestive heart failure) ([place])   ? Chronic combined systolic and  diastolic heart failure ([place]) 02/10/2018  ? DCM (dilated cardiomyopathy) ([place])   ? Encounter for adjustment of biventricular implantable cardioverter-defibrillator (ICD) 04/03/2019  ? Fatigue   ? ICD- Biventricular Medtronic ICD Claria Mri [id number] in situ 02/28/18 03/01/2018  ? LBBB (left bundle branch block)   ? Mild tricuspid regurgitation   ? Moderate aortic regurgitation   ? Moderate mitral regurgitation   ? Pericardial effusion   ? INSIGNIFICANT  ? S/P biventricular cardiac pacemaker procedure 02/28/18 with ICD MDT  03/01/2018  ? Short of breath on exertion   ? Sleep apnea   ? Systolic heart failure ([place])   ? Trace pulmonic regurgitation by prior echocardiogram   ? ?Past Surgical History:  ?Procedure Laterality Date  ? BIV ICD INSERTION CRT-D N/A 02/28/2018  ? Procedure: BIV ICD INSERTION CRT-D;  Surgeon: Deboraha Sprang, MD;  Location: [facility];  Service: Cardiovascular;  Laterality: N/A;  ? CARDIOVERSION N/A 04/11/2021  ? Procedure: CARDIOVERSION;  Surgeon: Adrian Prows, MD;  Location: [facility];  Service: Cardiovascular;  Laterality: N/A;  ? LEG SURGERY Left 2000  ? DUE TO BROKE LEG  ? RIGHT HEART CATH N/A 12/14/2020  ? Procedure: RIGHT HEART CATH;  Surgeon: Jolaine Artist, MD;  Location: [facility];  Service: Cardiovascular;  Laterality: N/A;  ? ?Family History  ?Problem Relation Age of Onset  ? Hypertension Mother 63  ? Thyroid disease Mother   ? Healthy Father 48  ? ?Social History  ? ?Tobacco Use  ? Smoking status: Never  ? Smokeless tobacco: Never  ?Substance Use  Topics  ? Alcohol use: No  ? Marital Status: Divorced ? ?ROS:  ? ?Review of Systems  ?Cardiovascular:  Positive for dyspnea on exertion and leg swelling. Negative for chest pain, orthopnea and paroxysmal nocturnal dyspnea.  ? ?Objective:  ?There were no vitals taken for this visit. There is no height or weight on file to calculate BMI.  ? ?  04/18/2021  ?  1:31 PM 04/11/2021  ?  8:36 AM 04/11/2021  ?  8:26 AM  ?Vitals with BMI   ?Weight 156 lbs 10 oz    ?BMI 22.47    ?Systolic 532 98 992  ?Diastolic 78 69 66  ?Pulse 78 71 69  ?   ?Physical Exam ?Vitals reviewed.  ?Constitutional:   ?   Appearance: He is well-developed.  ?Neck:  ?   Vascular: JVD present. No carotid bruit.  ?Cardiovascular:  ?   Rate and Rhythm: Normal rate and regular rhythm.  ?   Pulses: Normal pulses and intact distal pulses.  ?   Heart sounds: S1 normal and S2 normal. Murmur heard.  ?High-pitched blowing holosystolic murmur is present with a grade of 3/6 at the apex.  ?  No gallop.  ?Pulmonary:  ?   Effort: Pulmonary effort is normal. No accessory muscle usage or respiratory distress.  ?   Breath sounds: Rales (bibasilar) present. No wheezing or rhonchi.  ?Musculoskeletal:  ?   Right lower leg: Edema (2-3 + Ankle edema (varicose veins). Cellulitis shin) present.  ?   Left lower leg: Edema (2-3 + Ankle edema (varicose veins). Cellulitis shin) present.  ?Skin: ?   General: Skin is warm.  ?   Findings: Lesion (bilateral shin) present.  ? ?Laboratory examination:  ? ? ?  Latest Ref Rng & Units 04/18/2021  ?  2:27 PM 04/11/2021  ?  7:17 AM 12/14/2020  ?  1:12 PM  ?CMP  ?Glucose 70 - 99 mg/dL 136   110   106    ?BUN 8 - 23 mg/dL 84   55   69    ?Creatinine 0.61 - 1.24 mg/dL 2.21   1.80   1.98    ?Sodium 135 - 145 mmol/L 135   137   139    ?Potassium 3.5 - 5.1 mmol/L 2.7   3.2   2.8    ?Chloride 98 - 111 mmol/L 94   92   97    ?CO2 22 - 32 mmol/L 28    28    ?Calcium 8.9 - 10.3 mg/dL 9.1    9.1    ? ? ?  Latest Ref Rng & Units 04/11/2021  ?  7:17 AM 12/14/2020  ?  1:03 PM 12/06/2020  ? 11:42 AM  ?CBC  ?WBC 3.4 - 10.8 x10E3/uL   6.1    ?Hemoglobin 13.0 - 17.0 g/dL 14.6   14.3    ? 14.6   13.2    ?Hematocrit 39.0 - 52.0 % 43.0   42.0    ? 43.0   39.7    ?Platelets 150 - 450 x10E3/uL   150    ? ?Lipid Panel  ?   ?Component Value Date/Time  ? CHOL 179 03/20/2019 0804  ? TRIG 61 03/20/2019 0804  ? HDL 51 03/20/2019 0804  ? [place] 116 (H) 03/20/2019 0804  ? ?HEMOGLOBIN A1C ?No results  found for: HGBA1C, MPG ?TSH ?Recent Labs  ?  12/06/20 ?1142  ?TSH 4.190  ? ?  ?BNP (last 3 results) ?Recent Labs  ?  11/21/20 ?0102 12/02/20 ?1320 04/18/21 ?1428  ?BNP 2,117.5* 1,791.6* 1,882.3*  ? ? ? ?Current Outpatient Medications:  ?  amiodarone (PACERONE) 200 MG tablet, Take 200 mg by mouth daily., Disp: , Rfl:  ?  apixaban (ELIQUIS) 5 MG TABS tablet, Take 1 tablet (5 mg total) by mouth 2 (two) times daily., Disp: 180 tablet, Rfl: 1 ?  carvedilol (COREG) 3.125 MG tablet, Take 1 tablet (3.125 mg total) by mouth 2 (two) times daily with a meal., Disp: 180 tablet, Rfl: 1 ?  magnesium oxide (MAG-OX) 400 MG tablet, Take 1 tablet (400 mg total) by mouth 2 (two) times daily., Disp: 180 tablet, Rfl: 3 ?  metolazone (ZAROXOLYN) 2.5 MG tablet, Take 1 tablet (2.5 mg total) by mouth daily as needed. Take one tab daily for worsening shortness of breath/leg swelling (Patient taking differently: Take 2.5 mg by mouth daily. Take one tab daily for worsening shortness of breath/leg swelling), Disp: 30 tablet, Rfl: 2 ?  pantoprazole (PROTONIX) 40 MG tablet, Take 40 mg by mouth daily., Disp: , Rfl:  ?  polyethylene glycol (MIRALAX / GLYCOLAX) 17 g packet, Take 17 g by mouth daily., Disp: , Rfl:  ?  Potassium Chloride ER 20 MEQ TBCR, Take 60 mEq by mouth daily. Taking with metolazone, Disp: 90 tablet, Rfl: 6 ?  spironolactone (ALDACTONE) 25 MG tablet, Take 0.5 tablets (12.5 mg total) by mouth daily., Disp: 15 tablet, Rfl: 6 ?  torsemide (DEMADEX) 20 MG tablet, Take 2 tablets (40 mg total) by mouth 2 (two) times daily., Disp: 120 tablet, Rfl: 6  ?Radiology:  ? ?No results found. ? ?Cardiac Studies:  ? ?Exercise sestamibi stress test 08/27/2014: ?1. The resting electrocardiogram demonstrated normal sinus rhythm, incomplete LBBB and no resting arrhythmias.  ST segment depression and T-wave inversion in inferior and lateral leads. Stress EKG is negative for ischemia. The patient performed treadmill exercise using a Bruce protocol,  completing 6:26 minutes. Thre were occasional PVCs. The patient completed an estimated workload of 7.65 METS, 95% of the maximum predicted heart rate. The stress test was terminated because of dyspnea. ?2

## 2021-04-20 NOTE — Progress Notes (Signed)
Patient arrived to unit. Too weak to transfer from stretcher to bed, staff assisted. Bed weight obtained. Placed on telemetry. Orders and medications reviewed. Patient to start on amiodarone and dobutamine, medications not infusing on arrival to ED.  ?

## 2021-04-20 NOTE — Consult Note (Signed)
CARDIOLOGY CONSULT NOTE  ?Patient ID: ?Miguel Patterson ?MRN: [id number] ?DOB/AGE: [birth year] 76 y.o. ? ?Admit date: 04/20/2021 ?Referring Physician  Axel Filler, MD ?Primary Physician:  Nickola Major, MD ?Reason for Consultation  CHF management ? ?Patient ID: Miguel Patterson, male    DOB: 17-Sep-1945, 76 y.o.   MRN: [id number] ? ?Chief Complaint  ?Patient presents with  ? Shortness of Breath  ? ?HPI:   ? ?Miguel Patterson  is a 76 y.o. 76 y.o. 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  He was placed on on mexiletine for frequent PVCs, PVC burden has significantly reduced from 30% to 11% with resolution of recurrent decompensated heart failure, however due to onset of persistent atrial fibrillation, this had to be discontinued, patient was started on amiodarone.   ? ?Patient underwent direct-current cardioversion on 04/11/2021 to sinus rhythm.  Unfortunately he was recently seen by Dr. Christene Slates 04/18/2021 and he was back in atrial fibrillation, he has had recurrence of decompensated heart failure.  When I saw him during cardioversion, his leg edema had completely resolved, his dyspnea had improved.  He now presents for follow-up of CHF, he is being closely monitored by both Dr. Pierre Bali and also myself.  I had seen him on 04/03/2021 where patient was in florid heart failure and also had developed cellulitis of his bilateral shin.  He was started on antibiotics as well and during cardioversion, patient was well compensated with complete resolution of leg edema and resolution of cellulitis.  Stated that after cardioversion he felt well for 2 days then started feeling bad again. ? ?I saw him  currently in our office with patient inability to walk even from waiting room into the examination room without having to stop due to shortness of breath, stated that he is miserable and could not sleep all night due to severe orthopnea.  He looked very fatigued and advised him that he should be admitted to the hospital which he agreed immediately.  He has not had any chest pain.  Leg edema is started to come back. ? ? ?Past Medical History:  ?Diagnosis Date  ? AICD (automatic cardioverter/defibrillator) present   ? Cardiomegaly   ? CHF (congestive heart failure) ([place])   ? Chronic combined systolic and diastolic heart failure ([place]) 02/10/2018  ? DCM (dilated cardiomyopathy) ([place])   ? Encounter for adjustment of biventricular implantable cardioverter-defibrillator (ICD) 04/03/2019  ? Fatigue   ? ICD- Biventricular Medtronic ICD Claria Mri [id number] in situ 02/28/18 03/01/2018  ? LBBB (left bundle branch block)   ? Mild tricuspid regurgitation   ? Moderate aortic regurgitation   ? Moderate mitral regurgitation   ? Pericardial effusion   ? INSIGNIFICANT  ? S/P biventricular cardiac pacemaker procedure 02/28/18 with ICD MDT  03/01/2018  ? Short of breath on exertion   ? Sleep apnea   ? Systolic heart failure ([place])   ? Trace pulmonic regurgitation by prior echocardiogram   ? ?Past Surgical History:  ?Procedure Laterality Date  ? BIV ICD INSERTION CRT-D N/A 02/28/2018  ? Procedure: BIV ICD INSERTION CRT-D;  Surgeon: Deboraha Sprang, MD;  Location: [facility];  Service: Cardiovascular;  Laterality: N/A;  ? CARDIOVERSION N/A 04/11/2021  ? Procedure: CARDIOVERSION;  Surgeon: Adrian Prows, MD;  Location: [facility];  Service: Cardiovascular;  Laterality: N/A;  ? LEG SURGERY Left 2000  ? DUE TO BROKE LEG  ? RIGHT HEART CATH N/A 12/14/2020  ? Procedure: RIGHT HEART CATH;  Surgeon: Jolaine Artist, MD;  Location: [facility];  Service: Cardiovascular;  Laterality: N/A;  ? ?Social History  ? ?Tobacco Use  ? Smoking  status: Never  ? Smokeless tobacco: Never  ?Substance Use Topics  ? Alcohol use: No  ?  ?Family History  ?Problem Relation Age of Onset  ? Hypertension Mother 56  ? Thyroid disease Mother   ? Healthy Father 6  ?  ?Marital Status: Divorced  ?ROS  ?Review of Systems  ?Constitutional: Positive for decreased appetite and malaise/fatigue.  ?Cardiovascular:  Positive for dyspnea on exertion, leg swelling, orthopnea and palpitations. Negative for chest pain.  ?Objective  ? ? ?  04/20/2021  ?  5:00 PM 04/20/2021  ?  4:00 PM 04/20/2021  ?  3:30 PM  ?Vitals with BMI  ?Systolic 240 97 98  ?Diastolic 74 69 70  ?Pulse 73 78 72  ?  ?Blood pressure 102/74, pulse 73, temperature (!) 97.5 ?F (36.4 ?C), temperature source Oral, resp. rate 17, height 5\' 10"  (1.778 m), weight 71 kg, SpO2 100 %.  ? Physical Exam ?Constitutional:   ?   Appearance: Normal appearance. He is well-groomed.  ?Neck:  ?   Vascular: JVD present.  ?Cardiovascular:  ?   Rate and Rhythm: Normal rate. Rhythm irregular.  ?   Pulses:     ?     Popliteal pulses are 1+ on the right side and 1+ on the left side.  ?     Dorsalis pedis pulses are 1+ on the right side and 1+ on the left side.  ?Pulmonary:  ?   Effort: Tachypnea and respiratory distress (mild) present.  ?   Breath sounds: Examination of the right-lower field reveals wheezing and rhonchi. Examination of the left-lower field reveals wheezing and rhonchi. Wheezing and rhonchi present.  ?Musculoskeletal:  ?   Right lower leg: Edema present.  ?   Left lower leg: Edema present.  ?Skin: ?   Capillary Refill: Capillary refill takes less than 2 seconds.  ?Neurological:  ?   Mental Status: He is lethargic.  ? ?Laboratory examination:  ? ?Recent Labs  ?  12/14/20 ?1312 04/11/21 ?0717 04/18/21 ?1427 04/20/21 ?1155  ?NA 139 137 135 133*  ?K 2.8* 3.2* 2.7* 3.9  ?CL 97* 92* 94* 96*  ?CO2 28  --  28 22  ?GLUCOSE 106* 110* 136* 133*  ?BUN 69* 55* 84* 111*  ?CREATININE 1.98* 1.80* 2.21* 3.46*  ?CALCIUM 9.1  --  9.1 8.9   ?GFRNONAA 35*  --  30* 18*  ? ?estimated creatinine clearance is 18.5 mL/min (A) (by C-G formula based on SCr of 3.46 mg/dL (H)).  ? ?  Latest Ref Rng & Units 04/20/2021  ? 11:55 AM 04/18/2021  ?  2:27 PM 04/11/2021  ?  7:17 AM  ?CMP  ?Glucose 70 - 99 mg/dL 133   136   110    ?BUN 8 - 23 mg/dL 111   84   55    ?Creatinine 0.61 - 1.24 mg/dL 3.46   2.21   1.80    ?Sodium 135 - 145 mmol/L 133   135   137    ?Potassium 3.5 - 5.1 mmol/L 3.9   2.7   3.2    ?  Chloride 98 - 111 mmol/L 96   94   92    ?CO2 22 - 32 mmol/L 22   28     ?Calcium 8.9 - 10.3 mg/dL 8.9   9.1     ?Total Protein 6.5 - 8.1 g/dL 6.9      ?Total Bilirubin 0.3 - 1.2 mg/dL 3.1      ?Alkaline Phos 38 - 126 U/L 131      ?AST 15 - 41 U/L 40      ?ALT 0 - 44 U/L 27      ? ? ?  Latest Ref Rng & Units 04/20/2021  ? 11:55 AM 04/11/2021  ?  7:17 AM 12/14/2020  ?  1:03 PM  ?CBC  ?WBC 4.0 - 10.5 K/uL 7.7      ?Hemoglobin 13.0 - 17.0 g/dL 14.0   14.6   14.3    ? 14.6    ?Hematocrit 39.0 - 52.0 % 42.2   43.0   42.0    ? 43.0    ?Platelets 150 - 400 K/uL 142      ? ?Lipid Panel ?No results for input(s): CHOL, TRIG, LDLCALC, VLDL, HDL, CHOLHDL, LDLDIRECT in the last 8760 hours.  ?HEMOGLOBIN A1C ?No results found for: HGBA1C, MPG ?TSH ?Recent Labs  ?  12/06/20 ?1142  ?TSH 4.190  ? ?BNP (last 3 results) ?Recent Labs  ?  12/02/20 ?1320 04/18/21 ?1428 04/20/21 ?1155  ?BNP 1,791.6* 1,882.3* 1,925.8*  ? ? ?Cardiac Panel (last 3 results) ?Recent Labs  ?  04/20/21 ?1155 04/20/21 ?1335  ?TROPONINIHS 47* 40*  ?  ? ?Medications and allergies  ? ?Allergies  ?Allergen Reactions  ? Losartan Potassium Anaphylaxis  ?  angioedema  ? Aspirin Other (See Comments)  ?  Stomach pain ?  ?  ? ?Current Meds  ?Medication Sig  ? amiodarone (PACERONE) 200 MG tablet Take 200 mg by mouth daily.  ? apixaban (ELIQUIS) 5 MG TABS tablet Take 1 tablet (5 mg total) by mouth 2 (two) times daily.  ? carvedilol (COREG) 3.125 MG tablet Take 1 tablet (3.125 mg total) by mouth 2 (two) times daily with a meal.  ?  magnesium oxide (MAG-OX) 400 MG tablet Take 1 tablet (400 mg total) by mouth 2 (two) times daily.  ? metolazone (ZAROXOLYN) 2.5 MG tablet Take 1 tablet (2.5 mg total) by mouth daily as needed. Take one tab daily for

## 2021-04-21 ENCOUNTER — Inpatient Hospital Stay (HOSPITAL_COMMUNITY): Payer: Medicare Other

## 2021-04-21 ENCOUNTER — Telehealth: Payer: Self-pay | Admitting: Cardiology

## 2021-04-21 ENCOUNTER — Inpatient Hospital Stay: Payer: Self-pay

## 2021-04-21 ENCOUNTER — Encounter (HOSPITAL_COMMUNITY): Payer: Medicare Other | Admitting: Internal Medicine

## 2021-04-21 ENCOUNTER — Encounter (HOSPITAL_COMMUNITY): Payer: Medicare Other

## 2021-04-21 DIAGNOSIS — R570 Cardiogenic shock: Secondary | ICD-10-CM

## 2021-04-21 LAB — BASIC METABOLIC PANEL
Anion gap: 16 — ABNORMAL HIGH (ref 5–15)
Anion gap: 15 (ref 5–15)
BUN: 123 mg/dL — ABNORMAL HIGH (ref 8–23)
BUN: 120 mg/dL — ABNORMAL HIGH (ref 8–23)
CO2: 19 mmol/L — ABNORMAL LOW (ref 22–32)
CO2: 25 mmol/L (ref 22–32)
Calcium: 8.6 mg/dL — ABNORMAL LOW (ref 8.9–10.3)
Calcium: 9 mg/dL (ref 8.9–10.3)
Chloride: 96 mmol/L — ABNORMAL LOW (ref 98–111)
Chloride: 95 mmol/L — ABNORMAL LOW (ref 98–111)
Creatinine, Ser: 3.67 mg/dL — ABNORMAL HIGH (ref 0.61–1.24)
Creatinine, Ser: 3.56 mg/dL — ABNORMAL HIGH (ref 0.61–1.24)
GFR, Estimated: 16 mL/min — ABNORMAL LOW (ref >60)
GFR, Estimated: 17 mL/min — ABNORMAL LOW (ref >60)
Glucose, Bld: 150 mg/dL — ABNORMAL HIGH (ref 70–99)
Glucose, Bld: 131 mg/dL — ABNORMAL HIGH (ref 70–99)
Potassium: 3.4 mmol/L — ABNORMAL LOW (ref 3.5–5.1)
Potassium: 3.1 mmol/L — ABNORMAL LOW (ref 3.5–5.1)
Sodium: 131 mmol/L — ABNORMAL LOW (ref 135–145)
Sodium: 135 mmol/L (ref 135–145)

## 2021-04-21 LAB — COOXEMETRY PANEL
Carboxyhemoglobin: 0.9 % (ref 0.5–1.5)
Methemoglobin: 0.8 % (ref 0.0–1.5)
O2 Saturation: 60.1 %
Total hemoglobin: 13.6 g/dL (ref 12.0–16.0)

## 2021-04-21 LAB — LACTIC ACID, PLASMA
Lactic Acid, Venous: 1.8 mmol/L (ref 0.5–1.9)
Lactic Acid, Venous: 2.2 mmol/L (ref 0.5–1.9)

## 2021-04-21 LAB — MAGNESIUM: Magnesium: 3.2 mg/dL — ABNORMAL HIGH (ref 1.7–2.4)

## 2021-04-21 MED ORDER — POTASSIUM CHLORIDE CRYS ER 20 MEQ PO TBCR
40.0000 meq | EXTENDED_RELEASE_TABLET | Freq: Once | ORAL | Status: AC
  Administered 2021-04-21: 40 meq via ORAL
  Filled 2021-04-21: qty 2

## 2021-04-21 MED ORDER — SODIUM CHLORIDE 0.9% FLUSH
10.0000 mL | Freq: Two times a day (BID) | INTRAVENOUS | Status: DC
  Administered 2021-04-21: 40 mL
  Administered 2021-04-23: 30 mL
  Administered 2021-04-24 – 2021-04-28 (×7): 10 mL

## 2021-04-21 MED ORDER — POTASSIUM CHLORIDE CRYS ER 10 MEQ PO TBCR
10.0000 meq | EXTENDED_RELEASE_TABLET | Freq: Once | ORAL | Status: AC
  Administered 2021-04-21: 10 meq via ORAL
  Filled 2021-04-21: qty 1

## 2021-04-21 MED ORDER — CHLORHEXIDINE GLUCONATE CLOTH 2 % EX PADS
6.0000 | MEDICATED_PAD | Freq: Every day | CUTANEOUS | Status: DC
  Administered 2021-04-21 – 2021-04-28 (×7): 6 via TOPICAL

## 2021-04-21 MED ORDER — SODIUM CHLORIDE 0.9% FLUSH
10.0000 mL | INTRAVENOUS | Status: DC | PRN
  Administered 2021-04-25: 10 mL

## 2021-04-21 MED ORDER — POTASSIUM CHLORIDE 10 MEQ/100ML IV SOLN
10.0000 meq | INTRAVENOUS | Status: AC
  Administered 2021-04-21 – 2021-04-22 (×4): 10 meq via INTRAVENOUS
  Filled 2021-04-21 (×4): qty 100

## 2021-04-21 MED ORDER — POTASSIUM CHLORIDE CRYS ER 20 MEQ PO TBCR: 40.0000 meq | EXTENDED_RELEASE_TABLET | Freq: Two times a day (BID) | ORAL | Status: DC

## 2021-04-21 MED ORDER — POTASSIUM CHLORIDE 10 MEQ/100ML IV SOLN: 10.0000 meq | INTRAVENOUS | Status: DC

## 2021-04-21 NOTE — TOC Progression Note (Signed)
Transition of Care (TOC) - Progression Note  ? ? ?Patient Details  ?Name: Maximos Zayas Baskin ?MRN: [id number] ?Date of Birth: 1945-09-15 ? ?Transition of Care (TOC) CM/SW Contact  ?Yarisbel Miranda Renold Don, LCSWA ?Phone Number: ?04/21/2021, 11:32 AM ? ?Clinical Narrative:    ? ?Transition of Care (TOC) Screening Note ? ? ?Patient Details  ?Name: Darrow Barreiro Kessner ?Date of Birth: 1945/03/20 ? ? ?Transition of Care (TOC) CM/SW Contact:    ?Reece Agar, LCSWA ?Phone Number: ?04/21/2021, 11:32 AM ? ? ? ?Transition of Care Department [facility]) has reviewed patient and no TOC needs have been identified at this time. We will continue to monitor patient advancement through interdisciplinary progression rounds. If new patient transition needs arise, please place a TOC consult. ?  ? ? ?  ?  ? ?Expected Discharge Plan and Services ?  ?  ?  ?  ?  ?                ?  ?  ?  ?  ?  ?  ?  ?  ?  ?  ? ? ?Social Determinants of Health (SDOH) Interventions ?  ? ?Readmission Risk Interventions ?   ? View : No data to display.  ?  ?  ?  ? ? ?

## 2021-04-21 NOTE — Progress Notes (Signed)
Lactic Acid, venous 2.2. Informed Dr. Einar Gip. Will continue to monitor patient.   ?

## 2021-04-21 NOTE — Progress Notes (Signed)
Upon shift assessment, patient PIV infusing dobutamine was inflamed, tender to touch. MD at bedside and made aware. RN unable remove PIV due to dobutamine infusing and awaiting PICC placement confirmation. Family at bedside made aware. Patient educated and denies any hot therapy interventions or pain medications.  ? ?1900: Oncoming RN made aware. ?

## 2021-04-21 NOTE — Telephone Encounter (Signed)
Patient needs TOC 1 week per ST. ?

## 2021-04-21 NOTE — Progress Notes (Signed)
Peripherally Inserted Central Catheter Placement ? ?The IV Nurse has discussed with the patient and/or persons authorized to consent for the patient, the purpose of this procedure and the potential benefits and risks involved with this procedure.  The benefits include less needle sticks, lab draws from the catheter, and the patient may be discharged home with the catheter. Risks include, but not limited to, infection, bleeding, blood clot (thrombus formation), and puncture of an artery; nerve damage and irregular heartbeat and possibility to perform a PICC exchange if needed/ordered by physician.  Alternatives to this procedure were also discussed.  Bard Power PICC patient education guide, fact sheet on infection prevention and patient information card has been provided to patient /or left at bedside.   ? ?PICC Placement Documentation  ?PICC Double Lumen 04/21/21 Right Brachial 40 cm 0 cm (Active)  ?Indication for Insertion or Continuance of Line Vasoactive infusions 04/21/21 1600  ?Exposed Catheter (cm) 0 cm 04/21/21 1600  ?Site Assessment Clean, Dry, Intact 04/21/21 1600  ?Lumen #1 Status Flushed;Saline locked;Blood return noted 04/21/21 1600  ?Lumen #2 Status Flushed;Saline locked;Blood return noted 04/21/21 1600  ?Dressing Type Securing device;Transparent 04/21/21 1600  ?Dressing Status Antimicrobial disc in place 04/21/21 1600  ?Safety Lock Not Applicable 59/47/07 6151  ?Line Care Connections checked and tightened 04/21/21 1600  ?Dressing Intervention New dressing 04/21/21 1600  ?Dressing Change Due 04/28/21 04/21/21 1600  ? ? ? ? ? ?Holley Bouche Renee ?04/21/2021, 4:19 PM ? ?

## 2021-04-21 NOTE — Progress Notes (Addendum)
? ? ?HD#1 ?SUBJECTIVE:  ?Patient Summary: Miguel Patterson is a 76 year old gentleman living with chronic systolic heart failure with EF 15% secondary to nonischemic cardiomyopathy with pacemaker in place, atrial fibrillation, and CKD 3B admitted for acute on chronic heart failure, cardiogenic shock ? ?Overnight Events: NAEO ? ?  ?Interm History: Patient states he feels better today.  Breathing feels better, he was able to sleep lying supine for approximately 2 hours last night.  Appetite is improving as well. ? ?OBJECTIVE:  ?Vital Signs: ?Vitals:  ? 04/21/21 0200 04/21/21 0400 04/21/21 0401 04/21/21 0600  ?BP: 97/67 91/69 94/71  103/67  ?Pulse: 80 82 71 71  ?Resp:  18  16  ?Temp:    97.6 ?F (36.4 ?C)  ?TempSrc: Oral   Oral  ?SpO2:  99%  99%  ?Weight:    70.2 kg  ?Height:      ? ?Supplemental O2:  ?SpO2: 99 % ?O2 Flow Rate (L/min): 3 L/min ? ?Filed Weights  ? 04/20/21 1143 04/20/21 1900 04/21/21 0600  ?Weight: 71 kg 70.1 kg 70.2 kg  ? ? ? ?Intake/Output Summary (Last 24 hours) at 04/21/2021 0909 ?Last data filed at 04/21/2021 0800 ?Gross per 24 hour  ?Intake 1057.93 ml  ?Output 1050 ml  ?Net 7.93 ml  ? ?Net IO Since Admission: 7.93 mL [04/21/21 0909] ? ?Physical Exam: ?Constitutional: Elderly appearing, chronically ill, appears less fatigued, improved color ?Neck: supple JVD present to middle of neck, approximately 6 cm ?Cardiovascular: regular rate and rhythm, no m/r/g, soft heart sounds, JVD present, improved edema bilaterally, extremities warmer today, pulses stronger today ?Pulmonary/Chest: normal work of breathing on room air, lungs clear to auscultation bilaterally, absent crackles ?Abdominal: soft, non-tender, non-distended ?MSK: normal bulk and tone ?Neurological: alert & oriented x 3, 5/5 strength in bilateral upper and lower extremities, normal gait ?Skin: Warm and dry ?Psych: Mood and affect appropriate ? ?Patient Lines/Drains/Airways Status   ? ? Active Line/Drains/Airways   ? ? Name Placement date  Placement time Site Days  ? Peripheral IV 04/20/21 20 G Right Antecubital 04/20/21  1153  Antecubital  1  ? Peripheral IV 04/20/21 22 G Anterior;Right Forearm 04/20/21  1908  Forearm  1  ? ?  ?  ? ?  ? ? ?Pertinent Labs: ? ?  Latest Ref Rng & Units 04/20/2021  ? 11:55 AM 04/11/2021  ?  7:17 AM 12/14/2020  ?  1:03 PM  ?CBC  ?WBC 4.0 - 10.5 K/uL 7.7      ?Hemoglobin 13.0 - 17.0 g/dL 14.0   14.6   14.3    ? 14.6    ?Hematocrit 39.0 - 52.0 % 42.2   43.0   42.0    ? 43.0    ?Platelets 150 - 400 K/uL 142      ? ? ? ?  Latest Ref Rng & Units 04/21/2021  ?  3:48 AM 04/20/2021  ?  9:17 PM 04/20/2021  ? 11:55 AM  ?CMP  ?Glucose 70 - 99 mg/dL 150   180   133    ?BUN 8 - 23 mg/dL 123   117   111    ?Creatinine 0.61 - 1.24 mg/dL 3.67   3.63   3.46    ?Sodium 135 - 145 mmol/L 131   132   133    ?Potassium 3.5 - 5.1 mmol/L 3.4   3.6   3.9    ?Chloride 98 - 111 mmol/L 96   96   96    ?CO2  22 - 32 mmol/L 19   21   22     ?Calcium 8.9 - 10.3 mg/dL 8.6   8.6   8.9    ?Total Protein 6.5 - 8.1 g/dL   6.9    ?Total Bilirubin 0.3 - 1.2 mg/dL   3.1    ?Alkaline Phos 38 - 126 U/L   131    ?AST 15 - 41 U/L   40    ?ALT 0 - 44 U/L   27    ? ? ?No results for input(s): GLUCAP in the last 72 hours.  ? ?Pertinent Imaging: ?DG Chest Portable 1 View ? ?Result Date: 04/20/2021 ?CLINICAL DATA:  Shortness of breath EXAM: PORTABLE CHEST 1 VIEW COMPARISON:  Previous studies including the examination of 12/06/2020 FINDINGS: Transverse diameter of heart is markedly increased. Pacemaker/defibrillator battery is seen in the left infraclavicular region. Biventricular pacer leads are noted in place. There are no signs of pulmonary edema or focal pulmonary consolidation. There is no pleural effusion or pneumothorax. IMPRESSION: Cardiomegaly. There are no signs of pulmonary edema or focal pulmonary consolidation. Electronically Signed   By: Elmer Picker M.D.   On: 04/20/2021 12:39  ? ?[place] EKG SITE RITE ? ?Result Date: 04/21/2021 ?If [employer] not attached,  placement could not be confirmed due to current cardiac rhythm.  ? ?ASSESSMENT/PLAN:  ?Assessment: ?Principal Problem: ?  Cardiogenic shock ([place]) ?Active Problems: ?  Paroxysmal atrial fibrillation ([place]) ?  Acute on chronic HFrEF (heart failure with reduced ejection fraction) ([place]) ?  Acute renal failure superimposed on stage 3a chronic kidney disease ([place]) ? ?[name] is a 76 y.o. with pertinent PMH of chronic nonischemic systolic heart failure and CKD 3B who presented with shortness of breath and admitted for advanced heart failure on hospital day 0 ?  ?Cardiogenic shock ?Acute on chronic nonischemic systolic heart failure ? ?Patient follows with Dr. Einar Patterson and Dr. Haroldine Patterson as an outpatient. ?Heart failure is advanced and patient is critically ill, high risk of mortality. ?Dr. Einar Patterson has evaluated the patient today.  After extensive bedside discussion with patient and son he has been made DNR.  Patient does have defibrillator and will still be shocked appropriately. ?Concern for low output heart failure given low EF on prior echo, depressed cardiac output on cardiac catheterization from December 2022, and current cool extremities.   ?He is currently hemodynamically stable. ?- symptomatically feels improved, though I suspect this may be effects of the dobutamine he has been started on. Says his appetite is returning and he was able to sleep lying flat for 2 hours last night. He has diuresed 1L. Extremities are warmer today, though left leg is still relatively cool. His color seems improved as well. 4L [place] to 3L  overnight. His renal function does appear to be worse this morning. ?- Dobutamine 5 mcg/kg/min ?-- Lasix to 80 mg 3 times daily. ?- Metolazone 10 mg daily. ?-- Strict I's and O's ?-- Daily weights. Reported baseline 190lb ?- Appreciate cardiology assistance. ?-- Continue to monitor daily BMPs keeping potassium greater than 4 magnesium greater than 2 and replete as necessary. ?-- We will have  PICC placed today for better CVP monitoring and COOX ?- If patient's cardiac and renal function continue to worsen, can consider palliative care consult. ?  ?Cardiorenal syndrome ?Acute on chronic kidney injury 3B ?Baseline creatinine of 1.9, GFR of 30 to 40s.  Creatinine, BUN, GFR appear worse today than yesterday. Urine output has been low, approximately 1 L.  This may be related to worsening cardiorenal syndrome in the setting of his advanced heart failure though would anticipate improvements once he was initiated on dobutamine. Lower extremities with no edema today, JVD to the middle of the neck approximately 6 cm, lungs are clear though this may be related to his pulmonary hypertension.  We will continue to monitor patient's volume status closely with diuresis. ?-Continue to monitor daily BMPs. ?  ?Paroxysmal atrial fibrillation ?--IV amiodarone at loading dose 60 mg for 6 hours then transition to 30 mg/h ?-Eliquis 5 mg ?  ?Gastroesophageal reflux disease ?Protonix 40 mg daily. ? ?Constipation ?MiraLAX ? ?Signature: ?Delene Ruffini, MD  ?Internal Medicine Resident, PGY-1 ?[facility]  ?Pager: [phone] ?9:09 AM, 04/21/2021  ? ?Please contact the on call pager after 5 pm and on weekends at [phone]. ? ?

## 2021-04-21 NOTE — Progress Notes (Signed)
Echo attempted. Sterile procedure in progress at time of attempt. Will attempt again as schedule permits. ?

## 2021-04-21 NOTE — Progress Notes (Signed)
Pt's BP 82/70, HR 75, paused the Amiodarone IV, per standing orders. MD also ordered to hold Amio as well.  Pt had Lasix given earlier but sill has not urinated well since.  MD is aware of this.  Bladder scan 178 earlier but did urinate shortly after this.  Since Amio was paused his BP went to 102/68.  MD ordered to continue to hold the Amio due to the low BP.  Pt was still SOB at times.  Bladder scan again 175, MD aware.  WIll continue to monitor, Thanks Arvella Nigh RN. ?

## 2021-04-21 NOTE — Telephone Encounter (Signed)
Called patient, spoke with son, he stated that he spoke with Dr. Einar Gip yesterday and Miguel Patterson was wanting him to stay in the hospital for at least another 48 hours. We will again maybe in 2 days for [facility] call.

## 2021-04-22 ENCOUNTER — Inpatient Hospital Stay (HOSPITAL_COMMUNITY): Payer: Medicare Other

## 2021-04-22 DIAGNOSIS — R570 Cardiogenic shock: Secondary | ICD-10-CM | POA: Diagnosis not present

## 2021-04-22 LAB — HEPATIC FUNCTION PANEL
ALT: 27 U/L (ref 0–44)
AST: 36 U/L (ref 15–41)
Albumin: 3.4 g/dL — ABNORMAL LOW (ref 3.5–5.0)
Alkaline Phosphatase: 125 U/L (ref 38–126)
Bilirubin, Direct: 0.7 mg/dL — ABNORMAL HIGH (ref 0.0–0.2)
Indirect Bilirubin: 1.3 mg/dL — ABNORMAL HIGH (ref 0.3–0.9)
Total Bilirubin: 2 mg/dL — ABNORMAL HIGH (ref 0.3–1.2)
Total Protein: 6.9 g/dL (ref 6.5–8.1)

## 2021-04-22 LAB — BASIC METABOLIC PANEL
Anion gap: 11 (ref 5–15)
Anion gap: 16 — ABNORMAL HIGH (ref 5–15)
BUN: 108 mg/dL — ABNORMAL HIGH (ref 8–23)
BUN: 100 mg/dL — ABNORMAL HIGH (ref 8–23)
CO2: 29 mmol/L (ref 22–32)
CO2: 28 mmol/L (ref 22–32)
Calcium: 8.8 mg/dL — ABNORMAL LOW (ref 8.9–10.3)
Calcium: 9.1 mg/dL (ref 8.9–10.3)
Chloride: 93 mmol/L — ABNORMAL LOW (ref 98–111)
Chloride: 91 mmol/L — ABNORMAL LOW (ref 98–111)
Creatinine, Ser: 2.75 mg/dL — ABNORMAL HIGH (ref 0.61–1.24)
Creatinine, Ser: 2.54 mg/dL — ABNORMAL HIGH (ref 0.61–1.24)
GFR, Estimated: 23 mL/min — ABNORMAL LOW (ref >60)
GFR, Estimated: 26 mL/min — ABNORMAL LOW (ref >60)
Glucose, Bld: 136 mg/dL — ABNORMAL HIGH (ref 70–99)
Glucose, Bld: 175 mg/dL — ABNORMAL HIGH (ref 70–99)
Potassium: 5.4 mmol/L — ABNORMAL HIGH (ref 3.5–5.1)
Potassium: 3.1 mmol/L — ABNORMAL LOW (ref 3.5–5.1)
Sodium: 133 mmol/L — ABNORMAL LOW (ref 135–145)
Sodium: 135 mmol/L (ref 135–145)

## 2021-04-22 LAB — ECHOCARDIOGRAM COMPLETE
Area-P 1/2: 7.09 cm2
Calc EF: 10.3 %
Height: 70 in
MV M vel: 4.13 m/s
MV Peak grad: 68.2 mmHg
MV VTI: 2.26 cm2
P 1/2 time: 496 msec
Radius: 0.6 cm
S' Lateral: 7 cm
Single Plane A2C EF: 5.5 %
Single Plane A4C EF: 14.7 %
Weight: 2356.28 oz

## 2021-04-22 LAB — LACTIC ACID, PLASMA
Lactic Acid, Venous: 2.4 mmol/L (ref 0.5–1.9)
Lactic Acid, Venous: 1.6 mmol/L (ref 0.5–1.9)

## 2021-04-22 LAB — COOXEMETRY PANEL
Carboxyhemoglobin: 1.8 % — ABNORMAL HIGH (ref 0.5–1.5)
Methemoglobin: <0.7 % (ref 0.0–1.5)
O2 Saturation: 63.7 %
Total hemoglobin: 14 g/dL (ref 12.0–16.0)

## 2021-04-22 LAB — POTASSIUM: Potassium: 2.7 mmol/L — CL (ref 3.5–5.1)

## 2021-04-22 LAB — MAGNESIUM: Magnesium: 2.5 mg/dL — ABNORMAL HIGH (ref 1.7–2.4)

## 2021-04-22 MED ORDER — POTASSIUM CHLORIDE CRYS ER 20 MEQ PO TBCR
40.0000 meq | EXTENDED_RELEASE_TABLET | Freq: Once | ORAL | Status: AC
  Administered 2021-04-22: 40 meq via ORAL
  Filled 2021-04-22: qty 2

## 2021-04-22 MED ORDER — POTASSIUM CHLORIDE CRYS ER 20 MEQ PO TBCR: 40.0000 meq | EXTENDED_RELEASE_TABLET | Freq: Two times a day (BID) | ORAL | Status: DC

## 2021-04-22 MED ORDER — FUROSEMIDE 10 MG/ML IJ SOLN
80.0000 mg | Freq: Two times a day (BID) | INTRAMUSCULAR | Status: DC
Start: 2021-04-22 — End: 2021-04-23
  Administered 2021-04-22 – 2021-04-23 (×2): 80 mg via INTRAVENOUS
  Filled 2021-04-22 (×2): qty 8

## 2021-04-22 MED ORDER — PERFLUTREN LIPID MICROSPHERE
1.0000 mL | INTRAVENOUS | Status: AC | PRN
  Administered 2021-04-22: 2 mL via INTRAVENOUS
  Filled 2021-04-22: qty 10

## 2021-04-22 NOTE — Progress Notes (Signed)
Pharmacist Heart Failure Core Measure Documentation ? ?Assessment: ?Miguel Patterson has an EF documented as <20% on 4/22 by ECHO. ? ?Rationale: Heart failure patients with left ventricular systolic dysfunction (LVSD) and an EF < 40% should be prescribed an angiotensin converting enzyme inhibitor (ACEI) or angiotensin receptor blocker (ARB) at discharge unless a contraindication is documented in the medical record. ? ?This patient is not currently on an ACEI or ARB for HF. ? ?This note is being placed in the record in order to provide documentation that a contraindication to the use of these agents is present for this encounter. ? ?ACE Inhibitor or Angiotensin Receptor Blocker is contraindicated ?(specify all that apply) ? ?[]   ACEI allergy AND ARB allergy ?[]   Angioedema ?[]   Moderate or severe aortic stenosis ?[]   Hyperkalemia ?[x]   Hypotension ?[]   Renal artery stenosis ?[x]   Worsening renal function, preexisting renal disease or dysfunction ? ? ?Donald Pore 04/22/2021 3:48 PM  ?

## 2021-04-22 NOTE — Progress Notes (Signed)
Progress Note ? ?Patient Name: Miguel Patterson ?Date of Encounter: 04/22/2021 ? ?Attending physician: Axel Filler, * ?Primary care provider: Nickola Major, MD ?Primary Cardiologist: Dr. Adrian Prows ? ?Subjective: ?Miguel Patterson is a 76 y.o. male who was seen and examined at bedside  ?Denies chest pain. ?Shortness of breath and lower extremity swelling improving. ?Diuresing on dobutamine and IV Lasix ?No family present at bedside ?Case discussed and reviewed with his nurse. ? ?Objective: ?Vital Signs in the last 24 hours: ?Temp:  [97.8 ?F (36.6 ?C)-97.9 ?F (36.6 ?C)] 97.9 ?F (36.6 ?C) (04/22 0425) ?Pulse Rate:  [70-83] 83 (04/22 0425) ?Resp:  [15-16] 16 (04/22 0425) ?BP: (95-108)/(66-79) 100/66 (04/22 0600) ?SpO2:  [78 %-100 %] 98 % (04/22 0425) ?Weight:  [66.8 kg] 66.8 kg (04/22 0425) ? ?Intake/Output: ? ?Intake/Output Summary (Last 24 hours) at 04/22/2021 1220 ?Last data filed at 04/22/2021 0940 ?Gross per 24 hour  ?Intake 118 ml  ?Output 7120 ml  ?Net -7002 ml  ?  ?Net IO Since Admission: -8,376.07 mL [04/22/21 1220] ? ?Weights:  ?Filed Weights  ? 04/20/21 1900 04/21/21 0600 04/22/21 0425  ?Weight: 70.1 kg 70.2 kg 66.8 kg  ? ? ?Telemetry: Personally reviewed.  Normal sinus rhythm with ventricular paced rhythm ? ?Physical examination: ?PHYSICAL EXAM: ? ?  04/22/2021  ?  6:00 AM 04/22/2021  ?  5:00 AM 04/22/2021  ?  4:25 AM  ?Vitals with BMI  ?Weight   147 lbs 4 oz  ?BMI   21.13  ?Systolic 621 308 657  ?Diastolic 66 72 69  ?Pulse   83  ? ?General: Age-appropriate, frail, pleasant, no acute distress, hemodynamically stable, on room air. ?HEENT: Normocephalic, atraumatic, no scleral icterus or xanthelasmas.  JVP present. ?Heart: Regular, positive Q4-O9, holosystolic murmur heard at the apex, no gallops or rubs.  CRT in situ left infraclavicular region. ?Lungs: Clear to auscultation bilaterally.  Very minimal rhonchi's or wheezes at the bases. ?Abdomen soft, nontender, nondistended, positive bowel  sounds in all 4 quadrants, no ascites. ?Extremities: Cool to touch, 2+ dorsalis pedis pulses, difficult to appreciate posterior tibial pulses, no edema. ?Neuro: Alert and oriented x4, no focal neurological deficits. ?Psych: Normal affect, cooperative. ? ?Lab Results: ?Hematology ?Recent Labs  ?Lab 04/20/21 ?1155  ?WBC 7.7  ?RBC 4.68  ?HGB 14.0  ?HCT 42.2  ?MCV 90.2  ?MCH 29.9  ?MCHC 33.2  ?RDW 20.1*  ?PLT 142*  ? ? ?Chemistry ?Recent Labs  ?Lab 04/20/21 ?1155 04/20/21 ?2117 04/21/21 ?6295 04/21/21 ?1503 04/22/21 ?0515  ?NA 133*   < > 131* 135 133*  ?K 3.9   < > 3.4* 3.1* 5.4*  ?CL 96*   < > 96* 95* 93*  ?CO2 22   < > 19* 25 29  ?GLUCOSE 133*   < > 150* 131* 136*  ?BUN 111*   < > 123* 120* 108*  ?CREATININE 3.46*   < > 3.67* 3.56* 2.75*  ?CALCIUM 8.9   < > 8.6* 9.0 8.8*  ?PROT 6.9  --   --   --   --   ?ALBUMIN 3.5  --   --   --   --   ?AST 40  --   --   --   --   ?ALT 27  --   --   --   --   ?ALKPHOS 131*  --   --   --   --   ?BILITOT 3.1*  --   --   --   --   ?  GFRNONAA 18*   < > 16* 17* 23*  ?ANIONGAP 15   < > 16* 15 11  ? < > = values in this interval not displayed.  ?  ? ?Cardiac Enzymes: ?Cardiac Panel (last 3 results) ?Recent Labs  ?  04/20/21 ?1155 04/20/21 ?1335  ?TROPONINIHS 47* 40*  ? ? ?BNP (last 3 results) ?Recent Labs  ?  12/02/20 ?1320 04/18/21 ?1428 04/20/21 ?1155  ?BNP 1,791.6* 1,882.3* 1,925.8*  ? ? ?ProBNP (last 3 results) ?Recent Labs  ?  04/04/21 ?0814  ?PROBNP 17,519*  ? ? ? ?DDimer No results for input(s): DDIMER in the last 168 hours.  ? ?Hemoglobin A1c: No results found for: HGBA1C, MPG ? ?TSH  ?Recent Labs  ?  12/06/20 ?1142  ?TSH 4.190  ? ? ?Lipid Panel  ?   ?Component Value Date/Time  ? CHOL 179 03/20/2019 0804  ? TRIG 61 03/20/2019 0804  ? HDL 51 03/20/2019 0804  ? [place] 116 (H) 03/20/2019 0804  ? ? ?Imaging: ?DG Chest 1 View ? ?Addendum Date: 04/21/2021   ?ADDENDUM REPORT: 04/21/2021 18:55 ADDENDUM: Tip of right upper extremity PICC line is noted in the region of superior vena cava.  Electronically Signed   By: Elmer Picker M.D.   On: 04/21/2021 18:55  ? ?Result Date: 04/21/2021 ?CLINICAL DATA:  Shortness of breath EXAM: CHEST  1 VIEW COMPARISON:  04/20/2021 FINDINGS: Transverse diameter of heart is increased. There are no signs of pulmonary edema or focal pulmonary consolidation. There is no pleural effusion or pneumothorax. Pacemaker/defibrillator battery is seen in the left infraclavicular region. Biventricular pacer leads are noted in place. IMPRESSION: Cardiomegaly. There are no signs of pulmonary edema or new focal infiltrates. Electronically Signed: By: Elmer Picker M.D. On: 04/21/2021 17:47  ? ?US RENAL ? ?Result Date: 04/21/2021 ?CLINICAL DATA:  Acute kidney injury. EXAM: RENAL / URINARY TRACT ULTRASOUND COMPLETE COMPARISON:  April 27, 2019 FINDINGS: Right Kidney: Right kidney is not clearly identified. Left Kidney: Renal measurements: 14.5 cm x 6.2 cm x 5.9 cm = volume: 278.5 mL. Echogenicity within normal limits. 5.2 cm x 3.7 cm x 5.2 cm and 2.6 cm x 2.1 cm x 2.2 cm anechoic structures are seen within the mid and lower left kidney. No abnormal flow is noted within this region on color Doppler evaluation. No hydronephrosis is visualized. Bladder: Appears normal for degree of bladder distention. Other: The prostate gland is enlarged and measures 5.8 cm x 3.8 cm x 6.1 cm (70.9 mL). A mild amount of fluid is seen within the right upper quadrant. IMPRESSION: 1. Nonvisualization of the right kidney which may be congenital in nature. Correlation with the patient's history is recommended. 2. Simple left renal cysts. 3. Prostatomegaly. 4. Right upper quadrant free fluid. Electronically Signed   By: Virgina Norfolk M.D.   On: 04/21/2021 17:54  ? ?DG Chest Portable 1 View ? ?Result Date: 04/20/2021 ?CLINICAL DATA:  Shortness of breath EXAM: PORTABLE CHEST 1 VIEW COMPARISON:  Previous studies including the examination of 12/06/2020 FINDINGS: Transverse diameter of heart is markedly  increased. Pacemaker/defibrillator battery is seen in the left infraclavicular region. Biventricular pacer leads are noted in place. There are no signs of pulmonary edema or focal pulmonary consolidation. There is no pleural effusion or pneumothorax. IMPRESSION: Cardiomegaly. There are no signs of pulmonary edema or focal pulmonary consolidation. Electronically Signed   By: Elmer Picker M.D.   On: 04/20/2021 12:39  ? ?ECHOCARDIOGRAM COMPLETE ? ?Result Date: 04/22/2021 ?   ECHOCARDIOGRAM REPORT  Patient Name:   CORRIGAN KRETSCHMER Date of Exam: 04/22/2021 Medical Rec #:  [id number]            Height:       70.0 in Accession #:    [id number]           Weight:       147.3 lb Date of Birth:  1945-10-04           BSA:          1.833 m? Patient Age:    22 years             BP:           103/60 mmHg Patient Gender: M                    HR:           71 bpm. Exam Location:  Inpatient Procedure: 2D Echo, 3D Echo, Cardiac Doppler, Color Doppler and Intracardiac            Opacification Agent Indications:     Congestive Heart Failure I50.9  History:         Patient has prior history of Echocardiogram examinations, most                  recent 01/29/2021. Defibrillator, Arrythmias:LBBB; Risk                  Factors:Sleep Apnea. Non ischemic dilated cardiomyopathy.                  History of pericardial effusion.  Sonographer:     Darlina Sicilian RDCS Referring Phys:  [id number] [place] Diagnosing Phys: Rex Kras DO IMPRESSIONS  1. Left ventricular ejection fraction, by estimation, is <20%. The left ventricle has severely decreased function. The left ventricle demonstrates global hypokinesis. The left ventricular internal cavity size was dilated (LVIDd 7.4cm). Left ventricular diastolic parameters are consistent with Grade III diastolic dysfunction (restrictive). Elevated left atrial pressure. The E/e' is 19.3.  2. Right ventricular systolic function is mildly reduced. The right ventricular size is normal. There  is moderately elevated pulmonary artery systolic pressure. The estimated right ventricular systolic pressure is 30.8 mmHg.  3. Left atrial size was severely dilated.  4. Right atrial size was mild to moderatel

## 2021-04-22 NOTE — Progress Notes (Signed)
? ? ?HD#2 ?Subjective:  ?Overnight Events: No acute events  ? ?He is resting in bed comfortably.  He continues to endorse improvement in his breathing.  Denies needing oxygen.  Had difficulty sleeping overnight due to frequently being woken up.  Feels as though his lower extremity swelling is improved. ? ?Addendum ?1530 presented back to bedside to discuss findings with son.  Updated him on echocardiogram findings as well as renal ultrasound.  We discussed that we will continue the dobutamine and decrease his Lasix.  Had no further questions at that time.  Patient's son [name] asked me to talk in the hallway with him.  We discussed how his father has had improvement on the dobutamine but that this is not a permanent solution and that he is still critically ill despite the improvement. Encouraged him to reach out with any further questions.  ?  ?Objective:  ?Vital signs in last 24 hours: ?Vitals:  ? 04/22/21 0010 04/22/21 0425 04/22/21 0500 04/22/21 0600  ?BP: 108/75 105/69 105/72 100/66  ?Pulse: 71 83    ?Resp: 16 16    ?Temp: 97.9 ?F (36.6 ?C) 97.9 ?F (36.6 ?C)    ?TempSrc: Oral Oral    ?SpO2: 99% 98%    ?Weight:  66.8 kg    ?Height:      ? ?Supplemental O2: Room Air ? ?Physical Exam:  ?Constitutional: ill appearing ?HENT: normocephalic atraumatic ?Eyes: conjunctiva non-erythematous ?Neck: supple ?Cardiovascular: regular, holosystolic murmur. JVD to distal neck. No lower extremity edema ?Pulmonary/Chest: normal work of breathing on room air, lungs clear to auscultation bilaterally ?MSK: normal bulk and tone ?Neurological: alert & oriented x 3 ?Skin: extremities cool to touch.  ?Psych: normal mood and thought process ? ?Filed Weights  ? 04/20/21 1900 04/21/21 0600 04/22/21 0425  ?Weight: 70.1 kg 70.2 kg 66.8 kg  ? ? ? ?Intake/Output Summary (Last 24 hours) at 04/22/2021 1010 ?Last data filed at 04/22/2021 0940 ?Gross per 24 hour  ?Intake 236 ml  ?Output 8620 ml  ?Net -8384 ml  ? ?Net IO Since Admission: -8,376.07 mL  [04/22/21 1010] ? ?Pertinent Labs: ? ?  Latest Ref Rng & Units 04/20/2021  ? 11:55 AM 04/11/2021  ?  7:17 AM 12/14/2020  ?  1:03 PM  ?CBC  ?WBC 4.0 - 10.5 K/uL 7.7      ?Hemoglobin 13.0 - 17.0 g/dL 14.0   14.6   14.3    ? 14.6    ?Hematocrit 39.0 - 52.0 % 42.2   43.0   42.0    ? 43.0    ?Platelets 150 - 400 K/uL 142      ? ? ? ?  Latest Ref Rng & Units 04/22/2021  ?  5:15 AM 04/21/2021  ?  3:03 PM 04/21/2021  ?  3:48 AM  ?CMP  ?Glucose 70 - 99 mg/dL 136   131   150    ?BUN 8 - 23 mg/dL 108   120   123    ?Creatinine 0.61 - 1.24 mg/dL 2.75   3.56   3.67    ?Sodium 135 - 145 mmol/L 133   135   131    ?Potassium 3.5 - 5.1 mmol/L 5.4   3.1   3.4    ?Chloride 98 - 111 mmol/L 93   95   96    ?CO2 22 - 32 mmol/L 29   25   19     ?Calcium 8.9 - 10.3 mg/dL 8.8   9.0   8.6    ? ? ?  Imaging: ?DG Chest 1 View ? ?Addendum Date: 04/21/2021   ?ADDENDUM REPORT: 04/21/2021 18:55 ADDENDUM: Tip of right upper extremity PICC line is noted in the region of superior vena cava. Electronically Signed   By: Elmer Picker M.D.   On: 04/21/2021 18:55  ? ?Result Date: 04/21/2021 ?CLINICAL DATA:  Shortness of breath EXAM: CHEST  1 VIEW COMPARISON:  04/20/2021 FINDINGS: Transverse diameter of heart is increased. There are no signs of pulmonary edema or focal pulmonary consolidation. There is no pleural effusion or pneumothorax. Pacemaker/defibrillator battery is seen in the left infraclavicular region. Biventricular pacer leads are noted in place. IMPRESSION: Cardiomegaly. There are no signs of pulmonary edema or new focal infiltrates. Electronically Signed: By: Elmer Picker M.D. On: 04/21/2021 17:47  ? ?US RENAL ? ?Result Date: 04/21/2021 ?CLINICAL DATA:  Acute kidney injury. EXAM: RENAL / URINARY TRACT ULTRASOUND COMPLETE COMPARISON:  April 27, 2019 FINDINGS: Right Kidney: Right kidney is not clearly identified. Left Kidney: Renal measurements: 14.5 cm x 6.2 cm x 5.9 cm = volume: 278.5 mL. Echogenicity within normal limits. 5.2 cm x 3.7 cm  x 5.2 cm and 2.6 cm x 2.1 cm x 2.2 cm anechoic structures are seen within the mid and lower left kidney. No abnormal flow is noted within this region on color Doppler evaluation. No hydronephrosis is visualized. Bladder: Appears normal for degree of bladder distention. Other: The prostate gland is enlarged and measures 5.8 cm x 3.8 cm x 6.1 cm (70.9 mL). A mild amount of fluid is seen within the right upper quadrant. IMPRESSION: 1. Nonvisualization of the right kidney which may be congenital in nature. Correlation with the patient's history is recommended. 2. Simple left renal cysts. 3. Prostatomegaly. 4. Right upper quadrant free fluid. Electronically Signed   By: Virgina Norfolk M.D.   On: 04/21/2021 17:54   ? ?Assessment/Plan:  ? ?Principal Problem: ?  Cardiogenic shock ([place]) ?Active Problems: ?  Paroxysmal atrial fibrillation ([place]) ?  Acute on chronic HFrEF (heart failure with reduced ejection fraction) ([place]) ?  Acute renal failure superimposed on stage 3a chronic kidney disease ([place]) ? ? ?Patient Summary: ?Miguel Patterson is a 76 y.o. 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  ? ?Cardiogenic shock ?Noncompaction cardiomyopathy ?Severe HFrEF ?Valvular disease ?Patient endorses improvement in dyspnea since admission. He has diuresed around 8 L since admission.  Weight down to 66.8 kg. PICC placed yesterday, CVP's have been 8-12. cardiac output of 2.3, cardiac index 1.9, stroke volume 3.3.  Echocardiogram performed he has a severely dilated left ventricle with an EF estimate of under 20%.  His global hypokinesis.  His severe mitral and tricuspid regurgitation also contributing to his poor ejection fraction.  With his CVP is being well maintained we will decrease Lasix to twice daily dosing and continue on dobutamine.  Do not believe he needs addition of dopamine at this time. He remains  critically ill despite improvement. Do not believe he would be a candidate for any invasive procedure with the severity of his cardiac and renal disease.  ?-[facility] cardiology following, greatly appreciate their assistance in his care ?-Continue Lasix 80 mg twice daily, dobutamine gtt. ?-Cooximeter panel daily ?-CVP every shift ?-Daily weights ?-Strict ins and out ? ?Acute on chronic kidney injury 3B ?Electrolyte Derangements  ?Cross fused renal ectopia ?Kidney function with significant improvement with diuresis.  Renal ultrasound unable to view right kidney.  Urinary system ultrasound revealed a crossed fused ectopia left kidney was crossed fused right kidney.  Both had normal contour and parenchymal echogenicity.  They were found to have multiple anechoic cortical lesions arising from the kidneys up to 5.3 cm in the lower pole of the right kidney.  Patient with hypokalemia yesterday given repletion and found to have a K of 5.4 today.  Repeat K of 2.7, will give 40 meq of PO k and repeat level this evening ?-Evening K pending, Daily BMP ?-UA pending ?-Trend electrolytes ? ?Moderate pericardial effusion ?Patient found to have moderate pericardial fusion on examination.  Likely secondary to his severe heart failure.  MAPS have remained stable in the 70s to 84s. He does have JVD but suspect this is secondary to his severe heart failure.  No electrical alternans seen on EKG done today. No muffled heart sounds appreciated.  Mildly reduced RV function on echo.  Do not believe tamponade.  We will continue to monitor and if worsening hypotension or other signs of tamponade will consider repeat echo. ?-Continue to monitor ? ?Paroxysmal atrial fibrillation ?Remains rate controlled. Continue Eliquis ? ?Acid reflux ?Continue Protonix 40 mg daily ? ?Diet: Heart Healthy ?IVF: None,None ?VTE: Eliquis ?Code: DNR ?Family Update: Plan to talk with son today ? ?Dispo: Anticipated discharge home pending further work up and management  of his cardiogenic shock  ? ?Vasili Man Effertz DO ?Internal Medicine Resident PGY-2 ?Pager [phone] ?Please contact the on call pager after 5 pm and on weekends at [phone]. ? ?

## 2021-04-22 NOTE — Progress Notes (Addendum)
Lab notified Charge RN about critical potassium level of 2.7. Charge RN notified rounding team at bedside. Primary RN made aware. Rounding team to order IV K runs.  ?

## 2021-04-23 ENCOUNTER — Other Ambulatory Visit: Payer: Self-pay | Admitting: Cardiology

## 2021-04-23 DIAGNOSIS — R570 Cardiogenic shock: Secondary | ICD-10-CM | POA: Diagnosis not present

## 2021-04-23 DIAGNOSIS — I5022 Chronic systolic (congestive) heart failure: Secondary | ICD-10-CM

## 2021-04-23 LAB — LACTIC ACID, PLASMA: Lactic Acid, Venous: 2.7 mmol/L (ref 0.5–1.9)

## 2021-04-23 LAB — URINALYSIS, COMPLETE (UACMP) WITH MICROSCOPIC
Bacteria, UA: NONE SEEN
Bilirubin Urine: NEGATIVE
Glucose, UA: NEGATIVE mg/dL
Hgb urine dipstick: NEGATIVE
Ketones, ur: NEGATIVE mg/dL
Leukocytes,Ua: NEGATIVE
Nitrite: NEGATIVE
Protein, ur: NEGATIVE mg/dL
Specific Gravity, Urine: 1.009 (ref 1.005–1.030)
pH: 7 (ref 5.0–8.0)

## 2021-04-23 LAB — COOXEMETRY PANEL
Carboxyhemoglobin: 1.6 % — ABNORMAL HIGH (ref 0.5–1.5)
Carboxyhemoglobin: 1.2 % (ref 0.5–1.5)
Methemoglobin: <0.7 % (ref 0.0–1.5)
Methemoglobin: 2 % — ABNORMAL HIGH (ref 0.0–1.5)
O2 Saturation: 58.9 %
O2 Saturation: 51 %
Total hemoglobin: 14.5 g/dL (ref 12.0–16.0)
Total hemoglobin: 15.1 g/dL (ref 12.0–16.0)

## 2021-04-23 LAB — BASIC METABOLIC PANEL
Anion gap: 13 (ref 5–15)
Anion gap: 10 (ref 5–15)
BUN: 95 mg/dL — ABNORMAL HIGH (ref 8–23)
BUN: 78 mg/dL — ABNORMAL HIGH (ref 8–23)
CO2: 30 mmol/L (ref 22–32)
CO2: 24 mmol/L (ref 22–32)
Calcium: 9 mg/dL (ref 8.9–10.3)
Calcium: 7.1 mg/dL — ABNORMAL LOW (ref 8.9–10.3)
Chloride: 94 mmol/L — ABNORMAL LOW (ref 98–111)
Chloride: 100 mmol/L (ref 98–111)
Creatinine, Ser: 2.26 mg/dL — ABNORMAL HIGH (ref 0.61–1.24)
Creatinine, Ser: 1.94 mg/dL — ABNORMAL HIGH (ref 0.61–1.24)
GFR, Estimated: 30 mL/min — ABNORMAL LOW (ref >60)
GFR, Estimated: 35 mL/min — ABNORMAL LOW (ref >60)
Glucose, Bld: 145 mg/dL — ABNORMAL HIGH (ref 70–99)
Glucose, Bld: 170 mg/dL — ABNORMAL HIGH (ref 70–99)
Potassium: 3.3 mmol/L — ABNORMAL LOW (ref 3.5–5.1)
Potassium: 2.9 mmol/L — ABNORMAL LOW (ref 3.5–5.1)
Sodium: 137 mmol/L (ref 135–145)
Sodium: 134 mmol/L — ABNORMAL LOW (ref 135–145)

## 2021-04-23 LAB — MAGNESIUM: Magnesium: 2.4 mg/dL (ref 1.7–2.4)

## 2021-04-23 MED ORDER — AMIODARONE HCL 200 MG PO TABS: 200.0000 mg | ORAL_TABLET | Freq: Every day | ORAL | Status: DC

## 2021-04-23 MED ORDER — AMIODARONE HCL 200 MG PO TABS
200.0000 mg | ORAL_TABLET | Freq: Two times a day (BID) | ORAL | Status: DC
Start: 2021-04-23 — End: 2021-04-28
  Administered 2021-04-23 – 2021-04-28 (×10): 200 mg via ORAL
  Filled 2021-04-23 (×10): qty 1

## 2021-04-23 MED ORDER — POLYETHYLENE GLYCOL 3350 17 G PO PACK
17.0000 g | PACK | Freq: Two times a day (BID) | ORAL | Status: DC
  Administered 2021-04-23 – 2021-04-27 (×7): 17 g via ORAL
  Filled 2021-04-23 (×10): qty 1

## 2021-04-23 MED ORDER — POTASSIUM CHLORIDE CRYS ER 20 MEQ PO TBCR
40.0000 meq | EXTENDED_RELEASE_TABLET | Freq: Once | ORAL | Status: AC
  Administered 2021-04-23: 40 meq via ORAL
  Filled 2021-04-23: qty 2

## 2021-04-23 MED ORDER — SENNOSIDES-DOCUSATE SODIUM 8.6-50 MG PO TABS
1.0000 | ORAL_TABLET | Freq: Every day | ORAL | Status: DC
  Filled 2021-04-23 (×5): qty 1

## 2021-04-23 MED ORDER — TORSEMIDE 20 MG PO TABS
40.0000 mg | ORAL_TABLET | Freq: Two times a day (BID) | ORAL | Status: DC
  Administered 2021-04-23 – 2021-04-25 (×5): 40 mg via ORAL
  Filled 2021-04-23 (×6): qty 2

## 2021-04-23 NOTE — Progress Notes (Signed)
? ? ?HD#3 ?Subjective:  ?Overnight Events: No acute events  ? ?He is resting in bed comfortably. He endorses improvement in his energy level. He is urinating frequently and feels less lower extremity swelling. He has not had a bowel movement since admission. We reviewed his lab work and discussed improvement in kidney function and how this was in the setting of diuresis as well as dobutamine improving the pumping function of his heart.  ? ?We discussed him moving around more today and getting up to the recliner. He is agreeable to this.  ? ?Objective:  ?Vital signs in last 24 hours: ?Vitals:  ? 04/22/21 2206 04/22/21 2325 04/23/21 0340 04/23/21 0901  ?BP: 96/67 103/72 104/68 107/64  ?Pulse: 71 82 85 70  ?Resp:  18 16 18   ?Temp:  97.7 ?F (36.5 ?C) (!) 97.5 ?F (36.4 ?C) (!) 97.5 ?F (36.4 ?C)  ?TempSrc:  Oral Oral Oral  ?SpO2:  94% 98% 99%  ?Weight:   65.4 kg   ?Height:      ? ?Supplemental O2: Room Air ? ?Physical Exam:  ?Constitutional: ill appearing ?HENT: normocephalic atraumatic ?Eyes: conjunctiva non-erythematous ?Neck: supple ?Cardiovascular: regular, holosystolic murmur.  No lower extremity edema. No JVD appreciated  ?Pulmonary/Chest: normal work of breathing on room air. Dyspnea with prolonged discussion ?MSK: normal bulk and tone ?Neurological: alert & oriented x 3 ?Skin: extremities warm to touch ?Psych: normal mood and thought process ? ?Filed Weights  ? 04/21/21 0600 04/22/21 0425 04/23/21 0340  ?Weight: 70.2 kg 66.8 kg 65.4 kg  ? ? ? ?Intake/Output Summary (Last 24 hours) at 04/23/2021 1245 ?Last data filed at 04/23/2021 1100 ?Gross per 24 hour  ?Intake 930.41 ml  ?Output 4025 ml  ?Net -3094.59 ml  ? ? ?Net IO Since Admission: -11,470.66 mL [04/23/21 1245] ? ?Pertinent Labs: ? ?  Latest Ref Rng & Units 04/20/2021  ? 11:55 AM 04/11/2021  ?  7:17 AM 12/14/2020  ?  1:03 PM  ?CBC  ?WBC 4.0 - 10.5 K/uL 7.7      ?Hemoglobin 13.0 - 17.0 g/dL 14.0   14.6   14.3    ? 14.6    ?Hematocrit 39.0 - 52.0 % 42.2   43.0    42.0    ? 43.0    ?Platelets 150 - 400 K/uL 142      ? ? ? ?  Latest Ref Rng & Units 04/23/2021  ?  4:10 AM 04/22/2021  ?  6:50 PM 04/22/2021  ? 12:20 PM  ?CMP  ?Glucose 70 - 99 mg/dL 145   175     ?BUN 8 - 23 mg/dL 95   100     ?Creatinine 0.61 - 1.24 mg/dL 2.26   2.54     ?Sodium 135 - 145 mmol/L 137   135     ?Potassium 3.5 - 5.1 mmol/L 3.3   3.1   2.7    ?Chloride 98 - 111 mmol/L 94   91     ?CO2 22 - 32 mmol/L 30   28     ?Calcium 8.9 - 10.3 mg/dL 9.0   9.1     ?Total Protein 6.5 - 8.1 g/dL   6.9    ?Total Bilirubin 0.3 - 1.2 mg/dL   2.0    ?Alkaline Phos 38 - 126 U/L   125    ?AST 15 - 41 U/L   36    ?ALT 0 - 44 U/L   27    ? ? ?.coox ? ?  Assessment/Plan:  ? ?Principal Problem: ?  Cardiogenic shock ([place]) ?Active Problems: ?  Paroxysmal atrial fibrillation ([place]) ?  Acute on chronic HFrEF (heart failure with reduced ejection fraction) ([place]) ?  Acute renal failure superimposed on stage 3a chronic kidney disease ([place]) ? ?Patient Summary: ?Miguel Patterson is a 76 y.o. 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  ? ?Cardiogenic shock ?Noncompaction cardiomyopathy ?Severe HFrEF ?Valvular disease ?Continues to endorse improvement in his dyspnea at rest as well as lower extremity swelling. His appetite has also improved. Diuresed -4L yesterday. CVP of 6, coox of 58.9% from 63.7%. Fick CI 1.6. He continues to be dyspneic with long conversations. Will discontinue IV lasix and restart home torsemide 40 mg BID. Continue on dobutamine ggt. We discussed while he feels much better and labs have shown improvement, this is on medications helping his heart pump which are not long term solutions. Will need to determine how he does off of dobutamine. If he declines as the dobutamine is withdrawn, will consider palliative consult.  ?-[facility] cardiology following, greatly appreciate their assistance in his  care ?-Transition to torsemide PO 40 mg BID ?-Continue dobutamine ggt ?-Cooximeter panel daily ?-CVP every shift ?-Daily weights ?-Strict ins and out ? ?Acute on chronic kidney injury 3B ?Cardiorenal syndrome ?Electrolyte Derangements  ?Cross fused renal ectopia ?Kidney function continues to improve GFR of 2.26 from 2.54.  K of 3.3.  Suspect he is having improved perfusion and decrease congestion from his diuresis. ?-Evening K pending, Daily BMP ?-Trend electrolytes ? ?Moderate pericardial effusion ?No evidence of worsening pericardial exam, no JVD, no persistent hypotension or muffled heart sounds. ?-Continue to monitor ? ?Paroxysmal atrial fibrillation ?Remains rate controlled. Continue Eliquis.  Discontinue amiodarone today ? ?Acid reflux ?Continue Protonix 40 mg daily ? ?Constipation ?Senna 1 tab daily and miralax BID  ? ?Diet: Heart Healthy ?IVF: None,None ?VTE: Eliquis ?Code: DNR ?Family Update: Spoke with son [name] yesterday ? ?Dispo: Anticipated discharge home pending further work up and management of his cardiogenic shock  ? ?Vasili Treazure Nery DO ?Internal Medicine Resident PGY-2 ?Pager [phone] ?Please contact the on call pager after 5 pm and on weekends at [phone]. ? ?

## 2021-04-23 NOTE — Progress Notes (Signed)
?   04/23/21 1616  ?Mobility  ?Activity Transferred from bed to chair  ? ? ?

## 2021-04-23 NOTE — Progress Notes (Signed)
Lactic acid 2.7, K 2.9, MD notified, will continue to monitor, Thanks Arvella Nigh RN. ?

## 2021-04-23 NOTE — Hospital Course (Addendum)
Non-compaction cardiomyopathy ?Cardiogenic shock  ?Severe HFrEF ?Severe valvular disease  ?-Patient was admitted on 4/20 for cardiogenic shock with symptoms of increased shortness of breath and lower extremity edema in the setting of his heart failure due to non-compaction cardiomyopathy.  ?-He was started on inotrope support with dobutamine and was also started on IV Lasix for diuresis. PICC was placed to obtain CVP and co-ox values during his hospital stay.  ?-Echocardiogram during this admission showed severely reduced EF at <20% as well as severe mitral regurgitation. ?-Patient improved clinically during his hospital stay and diuresed well on Lasix which was later switched to torsemide. However his co-ox values remained low, therefore patient remained on the dobutamine drip which was down-titrated slowly over the course of his admission.    ?-Patient continued to have difficulty tolerating lower doses of dobutamine down to 1 mcg/kg/min and therefore was raised back up to 2.5 by the cardiology team at time of discharge.  ?-Patient had a long discussion with his cardiologist, Dr. Adrian Prows, as well as the palliative care team and it was decided that patient will be able to be discharged home with home palliative care and outpatient dobutamine therapy.  ? ?Acute on chronic kidney disease secondary to cardiorenal syndrome ?-BUN 111 and creatinine 3.46 at time of admission thought to be due to cardiorenal syndrome.  ?-Kidney function improved with patient on dobutamine and diuretics.  ? ?Atrial fibrillation ?-Patient now s/p cardioversion on 4/25.  ?-He remains on amiodarone and Eliquis.  ? ? ?

## 2021-04-23 NOTE — Progress Notes (Signed)
Pt stated feeling sob and light headed. Oxygen saturation at 100%.  Pt was transferred back to bed.  ?Per Terri Skains MD to change dobutamine to original rate 14mcg/kg.min gtt. Per Vasilios MD & Terri Skains MD to hold torsemide dose. ? ?Pt is currently in bed with call bell within reach.  ?Pt stated feeling better.will continue monitoring. ?

## 2021-04-23 NOTE — Progress Notes (Signed)
Progress Note ? ?Patient Name: Miguel Patterson ?Date of Encounter: 04/23/2021 ? ?Attending physician: Miguel Patterson, * ?Primary care provider: Nickola Major, MD ?Primary Cardiologist: Dr. Adrian Patterson ? ?Subjective: ?Miguel Patterson is a 76 y.o. male who was seen and examined at bedside  ?Denies chest pain and shortness of breath. ?Patient states that he is feeling better since hospitalization ?Remains on dobutamine drip and IV diuretics ?Net negative urine output of 12.6 L ?Case discussed and reviewed with his nurse. ? ?Objective: ?Vital Signs in the last 24 hours: ?Temp:  [97.5 ?F (36.4 ?C)-97.7 ?F (36.5 ?C)] 97.5 ?F (36.4 ?C) (04/23 0901) ?Pulse Rate:  [70-85] 70 (04/23 0901) ?Resp:  [16-18] 18 (04/23 0901) ?BP: (96-107)/(64-72) 107/64 (04/23 0901) ?SpO2:  [94 %-100 %] 99 % (04/23 0901) ?Weight:  [65.4 kg] 65.4 kg (04/23 0340) ? ?Intake/Output: ? ?Intake/Output Summary (Last 24 hours) at 04/23/2021 1825 ?Last data filed at 04/23/2021 1616 ?Gross per 24 hour  ?Intake 715.85 ml  ?Output 4275 ml  ?Net -3559.15 ml  ?  ?Net IO Since Admission: -12,670.66 mL [04/23/21 1825] ? ?Weights:  ?Filed Weights  ? 04/21/21 0600 04/22/21 0425 04/23/21 0340  ?Weight: 70.2 kg 66.8 kg 65.4 kg  ? ? ?Telemetry: Personally reviewed.  Predominately sinus with ectopy and intermittent PAF. ? ?Physical examination: ?PHYSICAL EXAM: ? ?  04/23/2021  ?  9:01 AM 04/23/2021  ?  3:40 AM 04/22/2021  ? 11:25 PM  ?Vitals with BMI  ?Weight  144 lbs 3 oz   ?BMI  20.69   ?Systolic 884 166 063  ?Diastolic 64 68 72  ?Pulse 70 85 82  ? ?General: Age-appropriate, frail, pleasant, no acute distress, hemodynamically stable, on room air. ?HEENT: Normocephalic, atraumatic, no scleral icterus or xanthelasmas.  JVP (improving). ?Heart: Regular, positive K1-S0, holosystolic murmur heard at the apex, no gallops or rubs.  CRT in situ left infraclavicular region. ?Lungs: Clear to auscultation bilaterally.  No wheezes rales or rhonchi's. ?Abdomen soft,  nontender, nondistended, positive bowel sounds in all 4 quadrants, no ascites. ?Extremities: Cool to touch, 2+ dorsalis pedis pulses, difficult to appreciate posterior tibial pulses, no edema. ?Neuro: Alert and oriented x4, no focal neurological deficits. ?Psych: Normal affect, cooperative. ? ?Lab Results: ?Hematology ?Recent Labs  ?Lab 04/20/21 ?1155  ?WBC 7.7  ?RBC 4.68  ?HGB 14.0  ?HCT 42.2  ?MCV 90.2  ?MCH 29.9  ?MCHC 33.2  ?RDW 20.1*  ?PLT 142*  ? ? ?Chemistry ?Recent Labs  ?Lab 04/20/21 ?1155 04/20/21 ?2117 04/22/21 ?0515 04/22/21 ?1220 04/22/21 ?1850 04/23/21 ?0410  ?NA 133*   < > 133*  --  135 137  ?K 3.9   < > 5.4* 2.7* 3.1* 3.3*  ?CL 96*   < > 93*  --  91* 94*  ?CO2 22   < > 29  --  28 30  ?GLUCOSE 133*   < > 136*  --  175* 145*  ?BUN 111*   < > 108*  --  100* 95*  ?CREATININE 3.46*   < > 2.75*  --  2.54* 2.26*  ?CALCIUM 8.9   < > 8.8*  --  9.1 9.0  ?PROT 6.9  --   --  6.9  --   --   ?ALBUMIN 3.5  --   --  3.4*  --   --   ?AST 40  --   --  36  --   --   ?ALT 27  --   --  27  --   --   ?  ALKPHOS 131*  --   --  125  --   --   ?BILITOT 3.1*  --   --  2.0*  --   --   ?GFRNONAA 18*   < > 23*  --  26* 30*  ?ANIONGAP 15   < > 11  --  16* 13  ? < > = values in this interval not displayed.  ?  ? ?Cardiac Enzymes: ?Cardiac Panel (last 3 results) ?No results for input(s): CKTOTAL, CKMB, TROPONINIHS, RELINDX in the last 72 hours. ? ? ?BNP (last 3 results) ?Recent Labs  ?  12/02/20 ?1320 04/18/21 ?1428 04/20/21 ?1155  ?BNP 1,791.6* 1,882.3* 1,925.8*  ? ? ?ProBNP (last 3 results) ?Recent Labs  ?  04/04/21 ?0814  ?PROBNP 17,519*  ? ? ? ?DDimer No results for input(s): DDIMER in the last 168 hours.  ? ?Hemoglobin A1c: No results found for: HGBA1C, MPG ? ?TSH  ?Recent Labs  ?  12/06/20 ?1142  ?TSH 4.190  ? ? ?Lipid Panel  ?   ?Component Value Date/Time  ? CHOL 179 03/20/2019 0804  ? TRIG 61 03/20/2019 0804  ? HDL 51 03/20/2019 0804  ? [place] 116 (H) 03/20/2019 0804  ? ? ?Imaging: ?ECHOCARDIOGRAM COMPLETE ? ?Result Date:  04/22/2021 ?   ECHOCARDIOGRAM REPORT   Patient Name:   Miguel Patterson Date of Exam: 04/22/2021 Medical Rec #:  [id number]            Height:       70.0 in Accession #:    [id number]           Weight:       147.3 lb Date of Birth:  December 26, 1945           BSA:          1.833 m? Patient Age:    12 years             BP:           103/60 mmHg Patient Gender: M                    HR:           71 bpm. Exam Location:  Inpatient Procedure: 2D Echo, 3D Echo, Cardiac Doppler, Color Doppler and Intracardiac            Opacification Agent Indications:     Congestive Heart Failure I50.9  History:         Patient has prior history of Echocardiogram examinations, most                  recent 01/29/2021. Defibrillator, Arrythmias:LBBB; Risk                  Factors:Sleep Apnea. Non ischemic dilated cardiomyopathy.                  History of pericardial effusion.  Sonographer:     Darlina Sicilian RDCS Referring Phys:  [id number] Cable Diagnosing Phys: Rex Kras DO IMPRESSIONS  1. Left ventricular ejection fraction, by estimation, is <20%. The left ventricle has severely decreased function. The left ventricle demonstrates global hypokinesis. The left ventricular internal cavity size was dilated (LVIDd 7.4cm). Left ventricular diastolic parameters are consistent with Grade III diastolic dysfunction (restrictive). Elevated left atrial pressure. The E/e' is 19.3.  2. Right ventricular systolic function is mildly reduced. The right ventricular size is normal. There is moderately elevated pulmonary artery systolic pressure. The estimated  right ventricular systolic pressure is 04.8 mmHg.  3. Left atrial size was severely dilated.  4. Right atrial size was mild to moderately dilated.  5. Moderate pericardial effusion. The pericardial effusion is circumferential, mostly posterior.  6. The mitral valve is grossly normal. Severe mitral valve regurgitation. No evidence of mitral stenosis.  7. Tricuspid valve regurgitation is moderate  to severe.  8. The aortic valve is tricuspid. Aortic valve regurgitation is mild. Aortic valve sclerosis is present, with no evidence of aortic valve stenosis. Comparison(s): No significant change from prior study. Conclusion(s)/Recommendation(s): Findings consistent with dilated cardiomyopathy. No left ventricular mural or apical thrombus/thrombi. FINDINGS  Left Ventricle: Left ventricular ejection fraction, by estimation, is <20%. The left ventricle has severely decreased function. The left ventricle demonstrates global hypokinesis. Definity contrast agent was given IV to delineate the left ventricular endocardial borders. The left ventricular internal cavity size was dilated (LVIDd 7.4cm). There is no left ventricular hypertrophy. Left ventricular diastolic parameters are consistent with Grade III diastolic dysfunction (restrictive). Elevated left atrial pressure. The E/e' is 19.3. Right Ventricle: The right ventricular size is normal. No increase in right ventricular wall thickness. Right ventricular systolic function is mildly reduced. There is moderately elevated pulmonary artery systolic pressure. The tricuspid regurgitant velocity is 3.11 m/s, and with an assumed right atrial pressure of 15 mmHg, the estimated right ventricular systolic pressure is 88.9 mmHg. Left Atrium: Left atrial size was severely dilated. Right Atrium: Right atrial size was mild to moderately dilated. Pericardium: A moderately sized pericardial effusion is present. The pericardial effusion is circumferential, mostly posterior. Mitral Valve: The mitral valve is grossly normal. Severe mitral valve regurgitation, with eccentric posteriorly directed jet. No evidence of mitral valve stenosis. Mitral valve area by planimetry is 5.65 cm?, and by the pressure half-time method is calculated to be 7.09 cm?Marland Kitchen The mean transmitral gradient is 2.0 mmHg. MV peak gradient, 3.4 mmHg. The mean mitral valve gradient is 2.0 mmHg. Tricuspid Valve: The  tricuspid valve is grossly normal. Tricuspid valve regurgitation is moderate to severe. No evidence of tricuspid stenosis. Aortic Valve: The aortic valve is tricuspid. Aortic valve regurgitation is mild. A

## 2021-04-23 NOTE — Progress Notes (Incomplete)
? ? ?HD#3 ?Subjective:  ?Overnight Events: No acute events  ? ?He is resting in bed comfortably.  He states that his energy level is improved ? ?Addendum ?1530 presented back to bedside to discuss findings with son.  Updated him on echocardiogram findings as well as renal ultrasound.  We discussed that we will continue the dobutamine and decrease his Lasix.  Had no further questions at that time.  Patient's son [name] asked me to talk in the hallway with him.  We discussed how his father has had improvement on the dobutamine but that this is not a permanent solution and that he is still critically ill despite the improvement. Encouraged him to reach out with any further questions.  ?  ?Objective:  ?Vital signs in last 24 hours: ?Vitals:  ? 04/22/21 2100 04/22/21 2206 04/22/21 2325 04/23/21 0340  ?BP: 106/67 96/67 103/72 104/68  ?Pulse: 78 71 82 85  ?Resp: 18  18 16   ?Temp: 97.6 ?F (36.4 ?C)  97.7 ?F (36.5 ?C) (!) 97.5 ?F (36.4 ?C)  ?TempSrc: Oral  Oral Oral  ?SpO2: 100%  94% 98%  ?Weight:    65.4 kg  ?Height:      ? ?Supplemental O2: Room Air ? ?Physical Exam:  ?Constitutional: ill appearing ?HENT: normocephalic atraumatic ?Eyes: conjunctiva non-erythematous ?Neck: supple ?Cardiovascular: regular, holosystolic murmur. JVD to distal neck. No lower extremity edema ?Pulmonary/Chest: normal work of breathing on room air, lungs clear to auscultation bilaterally ?MSK: normal bulk and tone ?Neurological: alert & oriented x 3 ?Skin: extremities cool to touch.  ?Psych: normal mood and thought process ? ?Filed Weights  ? 04/21/21 0600 04/22/21 0425 04/23/21 0340  ?Weight: 70.2 kg 66.8 kg 65.4 kg  ? ? ? ?Intake/Output Summary (Last 24 hours) at 04/23/2021 0830 ?Last data filed at 04/23/2021 0700 ?Gross per 24 hour  ?Intake 928.41 ml  ?Output 5565 ml  ?Net -4636.59 ml  ? ? ?Net IO Since Admission: -11,590.66 mL [04/23/21 0830] ? ?Pertinent Labs: ? ?  Latest Ref Rng & Units 04/20/2021  ? 11:55 AM 04/11/2021  ?  7:17 AM 12/14/2020  ?   1:03 PM  ?CBC  ?WBC 4.0 - 10.5 K/uL 7.7      ?Hemoglobin 13.0 - 17.0 g/dL 14.0   14.6   14.3    ? 14.6    ?Hematocrit 39.0 - 52.0 % 42.2   43.0   42.0    ? 43.0    ?Platelets 150 - 400 K/uL 142      ? ? ? ?  Latest Ref Rng & Units 04/23/2021  ?  4:10 AM 04/22/2021  ?  6:50 PM 04/22/2021  ? 12:20 PM  ?CMP  ?Glucose 70 - 99 mg/dL 145   175     ?BUN 8 - 23 mg/dL 95   100     ?Creatinine 0.61 - 1.24 mg/dL 2.26   2.54     ?Sodium 135 - 145 mmol/L 137   135     ?Potassium 3.5 - 5.1 mmol/L 3.3   3.1   2.7    ?Chloride 98 - 111 mmol/L 94   91     ?CO2 22 - 32 mmol/L 30   28     ?Calcium 8.9 - 10.3 mg/dL 9.0   9.1     ?Total Protein 6.5 - 8.1 g/dL   6.9    ?Total Bilirubin 0.3 - 1.2 mg/dL   2.0    ?Alkaline Phos 38 - 126 U/L   125    ?  AST 15 - 41 U/L   36    ?ALT 0 - 44 U/L   27    ? ? ?Imaging: ?ECHOCARDIOGRAM COMPLETE ? ?Result Date: 04/22/2021 ?   ECHOCARDIOGRAM REPORT   Patient Name:   Miguel Patterson Date of Exam: 04/22/2021 Medical Rec #:  [id number]            Height:       70.0 in Accession #:    [id number]           Weight:       147.3 lb Date of Birth:  27-Dec-1945           BSA:          1.833 m? Patient Age:    76 years             BP:           103/60 mmHg Patient Gender: M                    HR:           71 bpm. Exam Location:  Inpatient Procedure: 2D Echo, 3D Echo, Cardiac Doppler, Color Doppler and Intracardiac            Opacification Agent Indications:     Congestive Heart Failure I50.9  History:         Patient has prior history of Echocardiogram examinations, most                  recent 01/29/2021. Defibrillator, Arrythmias:LBBB; Risk                  Factors:Sleep Apnea. Non ischemic dilated cardiomyopathy.                  History of pericardial effusion.  Sonographer:     Darlina Sicilian RDCS Referring Phys:  [id number] [place] Diagnosing Phys: Rex Kras DO IMPRESSIONS  1. Left ventricular ejection fraction, by estimation, is <20%. The left ventricle has severely decreased function. The left  ventricle demonstrates global hypokinesis. The left ventricular internal cavity size was dilated (LVIDd 7.4cm). Left ventricular diastolic parameters are consistent with Grade III diastolic dysfunction (restrictive). Elevated left atrial pressure. The E/e' is 19.3.  2. Right ventricular systolic function is mildly reduced. The right ventricular size is normal. There is moderately elevated pulmonary artery systolic pressure. The estimated right ventricular systolic pressure is 36.6 mmHg.  3. Left atrial size was severely dilated.  4. Right atrial size was mild to moderately dilated.  5. Moderate pericardial effusion. The pericardial effusion is circumferential, mostly posterior.  6. The mitral valve is grossly normal. Severe mitral valve regurgitation. No evidence of mitral stenosis.  7. Tricuspid valve regurgitation is moderate to severe.  8. The aortic valve is tricuspid. Aortic valve regurgitation is mild. Aortic valve sclerosis is present, with no evidence of aortic valve stenosis. Comparison(s): No significant change from prior study. Conclusion(s)/Recommendation(s): Findings consistent with dilated cardiomyopathy. No left ventricular mural or apical thrombus/thrombi. FINDINGS  Left Ventricle: Left ventricular ejection fraction, by estimation, is <20%. The left ventricle has severely decreased function. The left ventricle demonstrates global hypokinesis. Definity contrast agent was given IV to delineate the left ventricular endocardial borders. The left ventricular internal cavity size was dilated (LVIDd 7.4cm). There is no left ventricular hypertrophy. Left ventricular diastolic parameters are consistent with Grade III diastolic dysfunction (restrictive). Elevated left atrial pressure. The E/e' is 19.3. Right Ventricle: The right  ventricular size is normal. No increase in right ventricular wall thickness. Right ventricular systolic function is mildly reduced. There is moderately elevated pulmonary artery  systolic pressure. The tricuspid regurgitant velocity is 3.11 m/s, and with an assumed right atrial pressure of 15 mmHg, the estimated right ventricular systolic pressure is 52.7 mmHg. Left Atrium: Left atrial size was severely dilated. Right Atrium: Right atrial size was mild to moderately dilated. Pericardium: A moderately sized pericardial effusion is present. The pericardial effusion is circumferential, mostly posterior. Mitral Valve: The mitral valve is grossly normal. Severe mitral valve regurgitation, with eccentric posteriorly directed jet. No evidence of mitral valve stenosis. Mitral valve area by planimetry is 5.65 cm?, and by the pressure half-time method is calculated to be 7.09 cm?Marland Kitchen The mean transmitral gradient is 2.0 mmHg. MV peak gradient, 3.4 mmHg. The mean mitral valve gradient is 2.0 mmHg. Tricuspid Valve: The tricuspid valve is grossly normal. Tricuspid valve regurgitation is moderate to severe. No evidence of tricuspid stenosis. Aortic Valve: The aortic valve is tricuspid. Aortic valve regurgitation is mild. Aortic regurgitation PHT measures 496 msec. Aortic valve sclerosis is present, with no evidence of aortic valve stenosis. Pulmonic Valve: The pulmonic valve was normal in structure. Pulmonic valve regurgitation is mild. No evidence of pulmonic stenosis. Aorta: The aortic root and ascending aorta are structurally normal, with no evidence of dilitation. IAS/Shunts: There is right bowing of the interatrial septum, suggestive of elevated left atrial pressure. No atrial level shunt detected by color flow Doppler. Additional Comments: A device lead is visualized.  LEFT VENTRICLE PLAX 2D LVIDd:         7.40 cm      Diastology LVIDs:         7.00 cm      LV e' medial:    5.60 cm/s LV PW:         0.90 cm      LV E/e' medial:  16.3 LV IVS:        0.80 cm      LV e' lateral:   4.10 cm/s LVOT diam:     2.40 cm      LV E/e' lateral: 22.3 LV SV:         47 LV SV Index:   26 LVOT Area:     4.52 cm?  LV  Volumes (MOD) LV vol d, MOD A2C: 330.0 ml LV vol d, MOD A4C: 368.0 ml LV vol s, MOD A2C: 312.0 ml LV vol s, MOD A4C: 314.0 ml LV SV MOD A2C:     18.0 ml LV SV MOD A4C:     368.0 ml LV SV MOD BP:      36.0 ml RI

## 2021-04-23 NOTE — Progress Notes (Signed)
Co-ox, bmp and lactic acid lab collected and sent to lab. ?

## 2021-04-24 ENCOUNTER — Encounter (HOSPITAL_COMMUNITY): Payer: Medicare Other

## 2021-04-24 DIAGNOSIS — R570 Cardiogenic shock: Secondary | ICD-10-CM | POA: Diagnosis not present

## 2021-04-24 LAB — BASIC METABOLIC PANEL
Anion gap: 11 (ref 5–15)
Anion gap: 11 (ref 5–15)
Anion gap: 11 (ref 5–15)
BUN: 85 mg/dL — ABNORMAL HIGH (ref 8–23)
BUN: 84 mg/dL — ABNORMAL HIGH (ref 8–23)
BUN: 86 mg/dL — ABNORMAL HIGH (ref 8–23)
CO2: 29 mmol/L (ref 22–32)
CO2: 30 mmol/L (ref 22–32)
CO2: 29 mmol/L (ref 22–32)
Calcium: 8.6 mg/dL — ABNORMAL LOW (ref 8.9–10.3)
Calcium: 8.7 mg/dL — ABNORMAL LOW (ref 8.9–10.3)
Calcium: 8.7 mg/dL — ABNORMAL LOW (ref 8.9–10.3)
Chloride: 94 mmol/L — ABNORMAL LOW (ref 98–111)
Chloride: 94 mmol/L — ABNORMAL LOW (ref 98–111)
Chloride: 92 mmol/L — ABNORMAL LOW (ref 98–111)
Creatinine, Ser: 2.29 mg/dL — ABNORMAL HIGH (ref 0.61–1.24)
Creatinine, Ser: 2.13 mg/dL — ABNORMAL HIGH (ref 0.61–1.24)
Creatinine, Ser: 2.22 mg/dL — ABNORMAL HIGH (ref 0.61–1.24)
GFR, Estimated: 29 mL/min — ABNORMAL LOW (ref >60)
GFR, Estimated: 32 mL/min — ABNORMAL LOW (ref >60)
GFR, Estimated: 30 mL/min — ABNORMAL LOW (ref >60)
Glucose, Bld: 124 mg/dL — ABNORMAL HIGH (ref 70–99)
Glucose, Bld: 123 mg/dL — ABNORMAL HIGH (ref 70–99)
Glucose, Bld: 161 mg/dL — ABNORMAL HIGH (ref 70–99)
Potassium: 3.5 mmol/L (ref 3.5–5.1)
Potassium: 3.5 mmol/L (ref 3.5–5.1)
Potassium: 3.9 mmol/L (ref 3.5–5.1)
Sodium: 134 mmol/L — ABNORMAL LOW (ref 135–145)
Sodium: 135 mmol/L (ref 135–145)
Sodium: 132 mmol/L — ABNORMAL LOW (ref 135–145)

## 2021-04-24 LAB — CBC
HCT: 42.8 % (ref 39.0–52.0)
Hemoglobin: 14 g/dL (ref 13.0–17.0)
MCH: 29.3 pg (ref 26.0–34.0)
MCHC: 32.7 g/dL (ref 30.0–36.0)
MCV: 89.5 fL (ref 80.0–100.0)
Platelets: 176 10*3/uL (ref 150–400)
RBC: 4.78 MIL/uL (ref 4.22–5.81)
RDW: 18.9 % — ABNORMAL HIGH (ref 11.5–15.5)
WBC: 9.6 10*3/uL (ref 4.0–10.5)
nRBC: 0 % (ref 0.0–0.2)

## 2021-04-24 LAB — COOXEMETRY PANEL
Carboxyhemoglobin: 1.9 % — ABNORMAL HIGH (ref 0.5–1.5)
Methemoglobin: 1.5 % (ref 0.0–1.5)
O2 Saturation: 60.2 %
Total hemoglobin: 14.6 g/dL (ref 12.0–16.0)

## 2021-04-24 LAB — BRAIN NATRIURETIC PEPTIDE: B Natriuretic Peptide: 1094.6 pg/mL — ABNORMAL HIGH (ref 0.0–100.0)

## 2021-04-24 LAB — MAGNESIUM: Magnesium: 2.3 mg/dL (ref 1.7–2.4)

## 2021-04-24 LAB — PHOSPHORUS: Phosphorus: 3.2 mg/dL (ref 2.5–4.6)

## 2021-04-24 MED ORDER — SODIUM CHLORIDE 0.9 % IV SOLN: INTRAVENOUS | Status: DC

## 2021-04-24 NOTE — Progress Notes (Signed)
? ? ?HD#4 ?Subjective:  ?Overnight Events: No acute events  ? ?Patient seen and evaluated bedside.  States he is feeling better than yesterday.  Does complain of feeling as though he does not have much reserve.  No other complaints at this time. ? ?Objective:  ?Vital signs in last 24 hours: ?Vitals:  ? 04/24/21 0008 04/24/21 0338 04/24/21 0704 04/24/21 1100  ?BP: 105/67 103/69 101/67 97/67  ?Pulse: 74 75 74 77  ?Resp: 18 18 19 19   ?Temp: 97.8 ?F (36.6 ?C) 97.8 ?F (36.6 ?C) 97.8 ?F (36.6 ?C) 98.2 ?F (36.8 ?C)  ?TempSrc: Oral Oral Axillary Oral  ?SpO2: 99% 95% 100% 100%  ?Weight:  64.9 kg    ?Height:      ? ?Supplemental O2: Room Air ? ?Physical Exam:  ?Constitutional: ill appearing ?HENT: normocephalic atraumatic ?Eyes: conjunctiva non-erythematous ?Neck: supple ?Cardiovascular: regular, holosystolic murmur.  No lower extremity edema. No JVD appreciated  ?Pulmonary/Chest: normal work of breathing on room air. Dyspnea with prolonged discussion ?MSK: normal bulk and tone ?Neurological: alert & oriented x 3 ?Skin: extremities warm to touch ?Psych: normal mood and thought process ? ?Filed Weights  ? 04/22/21 0425 04/23/21 0340 04/24/21 0338  ?Weight: 66.8 kg 65.4 kg 64.9 kg  ? ? ? ?Intake/Output Summary (Last 24 hours) at 04/24/2021 1640 ?Last data filed at 04/24/2021 1300 ?Gross per 24 hour  ?Intake 798.91 ml  ?Output 1051 ml  ?Net -252.09 ml  ? ?Net IO Since Admission: -12,922.75 mL [04/24/21 1640] ? ?Pertinent Labs: ? ?  Latest Ref Rng & Units 04/24/2021  ?  7:36 AM 04/20/2021  ? 11:55 AM 04/11/2021  ?  7:17 AM  ?CBC  ?WBC 4.0 - 10.5 K/uL 9.6   7.7     ?Hemoglobin 13.0 - 17.0 g/dL 14.0   14.0   14.6    ?Hematocrit 39.0 - 52.0 % 42.8   42.2   43.0    ?Platelets 150 - 400 K/uL 176   142     ? ? ? ?  Latest Ref Rng & Units 04/24/2021  ?  5:17 AM 04/24/2021  ?  1:56 AM 04/23/2021  ?  5:54 PM  ?CMP  ?Glucose 70 - 99 mg/dL 123   124   170    ?BUN 8 - 23 mg/dL 84   85   78    ?Creatinine 0.61 - 1.24 mg/dL 2.13   2.29   1.94     ?Sodium 135 - 145 mmol/L 135   134   134    ?Potassium 3.5 - 5.1 mmol/L 3.5   3.5   2.9    ?Chloride 98 - 111 mmol/L 94   94   100    ?CO2 22 - 32 mmol/L 30   29   24     ?Calcium 8.9 - 10.3 mg/dL 8.7   8.6   7.1    ? ? ?.coox ? ?Assessment/Plan:  ? ?Principal Problem: ?  Cardiogenic shock ([place]) ?Active Problems: ?  Paroxysmal atrial fibrillation ([place]) ?  Acute on chronic HFrEF (heart failure with reduced ejection fraction) ([place]) ?  Acute renal failure superimposed on stage 3a chronic kidney disease ([place]) ? ?Patient Summary: ?Miguel Patterson is a 76 y.o. 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  ? ?Cardiogenic shock ?Noncompaction cardiomyopathy ?Severe HFrEF ?Valvular disease ?Patient was unable to tolerate dobutamine wean yesterday with significant symptoms at  2.5 dosing.  He was restarted on 5 mg with improvement in overall symptoms.  This morning O2 from Co. ox improved to 60 from 51 yesterday.  CVP 4 down from 6 yesterday.  Fick score cardiac index 1.7, previous 1.6.  He has diuresed well overnight with 2.2 L urine output.  Sodium and potassium have remained stable with continued diuresis.  Palliative care has been consulted to discuss goals of care if patient is unable to wean from dobutamine drip. ?- Physical therapy has evaluated the patient and is recommending home health PT ?-Torsemide PO 40 mg BID ?-Continue dobutamine ggt, wean as able per cardiology. ?-Cooximeter panel daily ?-CVP every shift ?-Daily weights ?-Strict ins and out ? ?Acute on chronic kidney injury 3B ?Cardiorenal syndrome ?Electrolyte Derangements  ?Renal function appears to be around baseline, improved with diuresis. ?We will check pm BMP, can replete potassium if still low.  Hold greater than 4 for DC cardioversion tomorrow ? ?Paroxysmal atrial fibrillation ?Remains rate controlled. Continue Eliquis.  Patient  is on oral amiodarone.  Cardiology planning for DC cardioversion tomorrow now that he is euvolemic on exam. ? ?Acid reflux ?Continue Protonix 40 mg daily ? ?Constipation ?Senna 1 tab daily and miralax BID  ? ? ?Delene Ruffini, MD ?Internal Medicine Resident PGY-1 ?Pager [phone] ?Please contact the on call pager after 5 pm and on weekends at [phone]. ? ?

## 2021-04-24 NOTE — H&P (View-Only) (Signed)
Subjective:  ?Patient feels much improved. ? ?Intake/Output from previous day: ? ?I/O last 3 completed shifts: ?In: 1034.8 [P.O.:880; I.V.:154.8] ?Out: 5326 [Urine:5325; Stool:1] ?No intake/output data recorded. ? ? ?Intake/Output Summary (Last 24 hours) at 04/24/2021 0840 ?Last data filed at 04/24/2021 0342 ?Gross per 24 hour  ?Intake 438.91 ml  ?Output 2251 ml  ?Net -1812.09 ml  ? ?Blood pressure 101/67, pulse 74, temperature 97.8 ?F (36.6 ?C), temperature source Axillary, resp. rate 19, height 5' 10" (1.778 m), weight 64.9 kg, SpO2 100 %. ?Physical Exam ?Neck:  ?   Vascular: No JVD.  ?Cardiovascular:  ?   Rate and Rhythm: Normal rate and regular rhythm.  ?   Pulses: Intact distal pulses.  ?   Heart sounds: Normal heart sounds. No murmur heard. ?  No gallop.  ?   Comments: Bilateral varicose veins in lower extremity noted ?Pulmonary:  ?   Effort: Pulmonary effort is normal.  ?   Breath sounds: Normal breath sounds.  ?Abdominal:  ?   General: Bowel sounds are normal.  ?   Palpations: Abdomen is soft.  ?Musculoskeletal:  ?   Right lower leg: No edema.  ?   Left lower leg: No edema.  ? ? ?Lab Results: ?BMP ?BNP (last 3 results) ?Recent Labs  ?  04/18/21 ?1428 04/20/21 ?1155 04/24/21 ?6808  ?BNP 1,882.3* 1,925.8* 1,094.6*  ? ? ?ProBNP (last 3 results) ?Recent Labs  ?  04/04/21 ?0814  ?PROBNP 17,519*  ? ? ?  Latest Ref Rng & Units 04/24/2021  ?  5:17 AM 04/24/2021  ?  1:56 AM 04/23/2021  ?  5:54 PM  ?BMP  ?Glucose 70 - 99 mg/dL 123   124   170    ?BUN 8 - 23 mg/dL 84   85   78    ?Creatinine 0.61 - 1.24 mg/dL 2.13   2.29   1.94    ?Sodium 135 - 145 mmol/L 135   134   134    ?Potassium 3.5 - 5.1 mmol/L 3.5   3.5   2.9    ?Chloride 98 - 111 mmol/L 94   94   100    ?CO2 22 - 32 mmol/L _0 ?Calcium 8.9 - 10.3 mg/dL 8.7   8.6   7.1    ? ? ?  Latest Ref Rng & Units 04/22/2021  ? 12:20 PM 04/20/2021  ? 11:55 AM 02/10/2020  ? 10:55 AM  ?Hepatic Function  ?Total Protein 6.5 - 8.1 g/dL 6.9   6.9   6.5    ?Albumin 3.5 - 5.0  g/dL 3.4   3.5   3.8    ?AST 15 - 41 U/L 36   40   19    ?ALT 0 - 44 U/L _1 ?Alk Phosphatase 38 - 126 U/L 125   131   64    ?Total Bilirubin 0.3 - 1.2 mg/dL 2.0   3.1   1.0    ?Bilirubin, Direct 0.0 - 0.2 mg/dL 0.7      ? ? ?  Latest Ref Rng & Units 04/24/2021  ?  7:36 AM 04/20/2021  ? 11:55 AM 04/11/2021  ?  7:17 AM  ?CBC  ?WBC 4.0 - 10.5 K/uL 9.6   7.7     ?Hemoglobin 13.0 - 17.0 g/dL 14.0   14.0   14.6    ?Hematocrit 39.0 - 52.0 % 42.8  42.2   43.0    ?Platelets 150 - 400 K/uL 176   142     ? ?Lipid Panel  ?   ?Component Value Date/Time  ? CHOL 179 03/20/2019 0804  ? TRIG 61 03/20/2019 0804  ? HDL 51 03/20/2019 0804  ? [place] 116 (H) 03/20/2019 0804  ? ?BNP (last 3 results) ?Recent Labs  ?  04/18/21 ?1428 04/20/21 ?1155 04/24/21 ?9741  ?BNP 1,882.3* 1,925.8* 1,094.6*  ? ? ?ProBNP (last 3 results) ?Recent Labs  ?  04/04/21 ?0814  ?PROBNP 17,519*  ? ? ?TSH ?Recent Labs  ?  12/06/20 ?1142  ?TSH 4.190  ? ? ?Imaging: ? ?Cardiac Studies: ? ?Echocardiogram 04/22/2021:  ?1. Left ventricular ejection fraction, by estimation, is <20%. The left ventricle has severely decreased function. The left ventricle demonstrates global hypokinesis. The left ventricular internal cavity size was dilated (LVIDd 7.4cm). Left ventricular  ?diastolic parameters are consistent with Grade III diastolic dysfunction (restrictive). Elevated left atrial pressure. The E/e' is 19.3. ? 2. Right ventricular systolic function is mildly reduced. The right ventricular size is normal. There is moderately elevated pulmonary artery systolic pressure. The estimated right ventricular systolic pressure is 63.8 mmHg. ? 3. Left atrial size was severely dilated. ? 4. Right atrial size was mild to moderately dilated. ? 5. Moderate pericardial effusion. The pericardial effusion is circumferential, mostly posterior. ? 6. The mitral valve is grossly normal. Severe mitral valve regurgitation. No evidence of mitral stenosis. ? 7. Tricuspid valve  regurgitation is moderate to severe. ? 8. The aortic valve is tricuspid. Aortic valve regurgitation is mild. Aortic valve sclerosis is present, with no evidence of aortic valve stenosis. ? ?Comparison(s): No significant change from prior study. ? ?Conclusion(s)/Recommendation(s): Findings consistent with dilated cardiomyopathy. No left ventricular mural or apical thrombus/thrombi. ? ?EKG: Underlying A. Fib with BV paced rhythm. ? ? ?Scheduled Meds: ? amiodarone  200 mg Oral BID  ? Followed by  ? [START ON 05/01/2021] amiodarone  200 mg Oral Daily  ? apixaban  5 mg Oral BID  ? Chlorhexidine Gluconate Cloth  6 each Topical Daily  ? pantoprazole  40 mg Oral QHS  ? polyethylene glycol  17 g Oral BID  ? senna-docusate  1 tablet Oral QHS  ? sodium chloride flush  10-40 mL Intracatheter Q12H  ? torsemide  40 mg Oral BID  ? ?Continuous Infusions: ? DOBUTamine 5 mcg/kg/min (04/23/21 1841)  ? ?PRN Meds:.sodium chloride flush ? ?Assessment/Plan:  ?1.  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  Persistent atrial fibrillation ?3.  Acute on chronic systolic heart failure ?4.  Chronic renal insufficiency stage IIIb-IV. ? ?Recommendation: Patient has done significantly well over the past 3 to 4 days, initially appeared to be dry and cool with low cardiac output however she has had approximately about 8 to 11 L of negative fluid balance since hospital admission.  Patient feels much improved.  No further JVD, infective mitral regurgitation murmur is not well appreciated on today's exam and there is no gallop.  Lungs are also clear to auscultate.  I will try to slowly wean him off of dobutamine over the next 24 to 48 hours, will reduce the dose to 2.5 mcg/kg/min.  Patient is not on any guideline directed medical therapy due to inability to tolerate the medications, co-Ox still borderline normal, hence would not initiate beta-blocker therapy.  Continue amiodarone only for now.  I will schedule him for  direct-current cardioversion tomorrow now that he is well compensated with  regard to fluids.  Renal function has returned back to baseline. ? ?With regard to disposition, patient prefers to go back home, he will need home health and home PT.  I am hoping that he will be discharged home in the next 2 to 3 days. ? ? ? ?Adrian Prows, MD, [facility] ?04/24/2021, 8:42 AM ?Office: [phone] ?Fax: [phone] ?Pager: [phone]  ?

## 2021-04-24 NOTE — Evaluation (Signed)
Physical Therapy Evaluation ?Patient Details ?Name: Miguel Patterson ?MRN: [id number] ?DOB: December 10, 1945 ?Today's Date: 04/24/2021 ? ?History of Present Illness ? 76 yo male admitted 4/20 from cardiologist office due to sudden SOB. Found to have acute on chronic CHF and CKD, dilated cardiomyopathy. Pt underwent cardioversion 4/11, back in afib at office visit on 4/18. LE cellulitis and volume overload being treated prior to admission. PMH includes: CHF, CKD IV, AICD, LBBB, ICD, severe MR. ?  ?Clinical Impression ? Pt in bed upon arrival of PT, agreeable to evaluation at this time. Prior to admission the pt was independent with all mobility and ADLs, living alone in a home with a single step to enter, relying on friends for transportation. The pt now presents with limitations in functional mobility, endurance, and dynamic stability due to above dx, and will continue to benefit from skilled PT to address these deficits. He initially tried use of RW and demos improved stability and speed with UE support, he was able to complete additional 150 ft without AD, but did need minG to minA due to minor LOB and moves with markedly slower speed. Will continue to benefit from skilled PT acutely to progress functional strength, stability, and facilitate return to prior functional independence.  ? ?VSS on RA with ambulation.    ?   ? ?Recommendations for follow up therapy are one component of a multi-disciplinary discharge planning process, led by the attending physician.  Recommendations may be updated based on patient status, additional functional criteria and insurance authorization. ? ?Follow Up Recommendations Home health PT (vs no follow up pending progress) ? ?  ?Assistance Recommended at Discharge Intermittent Supervision/Assistance  ?Patient can return home with the following ? A little help with walking and/or transfers;Assistance with cooking/housework ? ?  ?Equipment Recommendations None recommended by PT (pt plans to  get shower chair and RW from family)  ?Recommendations for Other Services ?    ?  ?Functional Status Assessment Patient has had a recent decline in their functional status and demonstrates the ability to make significant improvements in function in a reasonable and predictable amount of time.  ? ?  ?Precautions / Restrictions Precautions ?Precautions: None ?Restrictions ?Weight Bearing Restrictions: No  ? ?  ? ?Mobility ? Bed Mobility ?Overal bed mobility: Modified Independent ?  ?  ?  ?  ?  ?  ?General bed mobility comments: mildly increased time due to pt caution with lines ?  ? ?Transfers ?Overall transfer level: Needs assistance ?Equipment used: None ?Transfers: Sit to/from Stand ?Sit to Stand: Supervision ?  ?  ?  ?  ?  ?General transfer comment: supervision for safety and line management ?  ? ?Ambulation/Gait ?Ambulation/Gait assistance: Min guard ?Gait Distance (Feet): 200 Feet (with x2 standing rest) ?Assistive device: Rolling walker (2 wheels), None ?Gait Pattern/deviations: Step-through pattern, Decreased stride length ?Gait velocity: 0.28 m/s ?Gait velocity interpretation: <1.31 ft/sec, indicative of household ambulator ?  ?General Gait Details: pt initially with RW with good stability and good speed, states he is a fast walker at baseline. markedly slowed with increased lateral sway when walking without UE support, x2 minor LOB ? ?  ? ?Balance Overall balance assessment: Mild deficits observed, not formally tested ?  ?  ?  ?  ?  ?  ?  ?  ?  ?  ?  ?  ?  ?  ?  ?  ?  ?  ?   ? ? ? ?Pertinent Vitals/Pain Pain Assessment ?Pain Assessment: No/denies  pain  ? ? ?Home Living Family/patient expects to be discharged to:: Private residence ?Living Arrangements: Alone ?Available Help at Discharge: Family;Friend(s);Available PRN/intermittently ?Type of Home: House ?Home Access: Stairs to enter ?Entrance Stairs-Rails:  (columns he holds) ?Entrance Stairs-Number of Steps: 1 ?Alternate Level Stairs-Number of Steps:  13 ?Home Layout: Multi-level;Able to live on main level with bedroom/bathroom ?Home Equipment: None (can aquire shower seat and RW from family) ?Additional Comments: pt thinks his son will stay with him a few days after d/c.  ?  ?Prior Function Prior Level of Function : Independent/Modified Independent ?  ?  ?  ?  ?  ?  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 reports no falls, no use of DME. was running and doing core workouts each day until sep 2023 ?ADLs Comments: reports fully independent ?  ? ? ?Hand Dominance  ?   ? ?  ?Extremity/Trunk Assessment  ? Upper Extremity Assessment ?Upper Extremity Assessment: Defer to OT evaluation ?  ? ?Lower Extremity Assessment ?Lower Extremity Assessment: Overall WFL for tasks assessed (mild swelling bilaterally, pt reports complete improvement. denies change in sensation, no overt weakness to MMT) ?  ? ?Cervical / Trunk Assessment ?Cervical / Trunk Assessment: Normal  ?Communication  ? Communication: No difficulties (primary language portuguese, fluent in [place])  ?Cognition Arousal/Alertness: Awake/alert ?Behavior During Therapy: [facility] for tasks assessed/performed ?Overall Cognitive Status: Within Functional Limits for tasks assessed ?  ?  ?  ?  ?  ?  ?  ?  ?  ?  ?  ?  ?  ?  ?  ?  ?  ?  ?  ? ?  ?General Comments General comments (skin integrity, edema, etc.): VSS on RA with activity ? ?  ?Exercises    ? ?Assessment/Plan  ?  ?PT Assessment Patient needs continued PT services  ?PT Problem List Cardiopulmonary status limiting activity;Decreased activity tolerance;Decreased balance ? ?   ?  ?PT Treatment Interventions DME instruction;Gait training;Stair training;Functional mobility training;Therapeutic activities;Therapeutic exercise;Balance training;Patient/family education   ? ?PT Goals (Current goals can be found in the Care Plan section)  ?Acute Rehab PT Goals ?Patient  Stated Goal: to return home and gradually progress activity ?PT Goal Formulation: With patient ?Time For Goal Achievement: 05/08/21 ?Potential to Achieve Goals: Good ? ?  ?Frequency Min 3X/week ?  ? ? ?   ?AM-PAC PT "6 Clicks" Mobility  ?Outcome Measure Help needed turning from your back to your side while in a flat bed without using bedrails?: None ?Help needed moving from lying on your back to sitting on the side of a flat bed without using bedrails?: None ?Help needed moving to and from a bed to a chair (including a wheelchair)?: A Little ?Help needed standing up from a chair using your arms (e.g., wheelchair or bedside chair)?: A Little ?Help needed to walk in hospital room?: A Little ?Help needed climbing 3-5 steps with a railing? : A Little ?6 Click Score: 20 ? ?  ?End of Session Equipment Utilized During Treatment: Gait belt ?Activity Tolerance: Patient tolerated treatment well ?Patient left: in chair;with call bell/phone within reach;with family/visitor present ?Nurse Communication: Mobility status ?PT Visit Diagnosis: Other abnormalities of gait and mobility (R26.89) ?  ? ?Time: 7846-9629 ?PT Time Calculation (min) (ACUTE ONLY): 42 min ? ? ?Charges:   PT Evaluation ?$PT Eval Low Complexity: 1 Low ?PT Treatments ?$Gait Training: 8-22 mins ?$Therapeutic Exercise: 8-22 mins ?  ?   ? ? ?  [place] Carbo, PT, DPT  ? ?Acute Rehabilitation Department ?Pager #: [phone] - [phone] ? ?Sandra Cockayne ?04/24/2021, 10:02 AM ? ?

## 2021-04-24 NOTE — Progress Notes (Signed)
Subjective:  ?Patient feels much improved. ? ?Intake/Output from previous day: ? ?I/O last 3 completed shifts: ?In: 1034.8 [P.O.:880; I.V.:154.8] ?Out: 5326 [Urine:5325; Stool:1] ?No intake/output data recorded. ? ? ?Intake/Output Summary (Last 24 hours) at 04/24/2021 0840 ?Last data filed at 04/24/2021 0342 ?Gross per 24 hour  ?Intake 438.91 ml  ?Output 2251 ml  ?Net -1812.09 ml  ? ?Blood pressure 101/67, pulse 74, temperature 97.8 ?F (36.6 ?C), temperature source Axillary, resp. rate 19, height 5' 10" (1.778 m), weight 64.9 kg, SpO2 100 %. ?Physical Exam ?Neck:  ?   Vascular: No JVD.  ?Cardiovascular:  ?   Rate and Rhythm: Normal rate and regular rhythm.  ?   Pulses: Intact distal pulses.  ?   Heart sounds: Normal heart sounds. No murmur heard. ?  No gallop.  ?   Comments: Bilateral varicose veins in lower extremity noted ?Pulmonary:  ?   Effort: Pulmonary effort is normal.  ?   Breath sounds: Normal breath sounds.  ?Abdominal:  ?   General: Bowel sounds are normal.  ?   Palpations: Abdomen is soft.  ?Musculoskeletal:  ?   Right lower leg: No edema.  ?   Left lower leg: No edema.  ? ? ?Lab Results: ?BMP ?BNP (last 3 results) ?Recent Labs  ?  04/18/21 ?1428 04/20/21 ?1155 04/24/21 ?6808  ?BNP 1,882.3* 1,925.8* 1,094.6*  ? ? ?ProBNP (last 3 results) ?Recent Labs  ?  04/04/21 ?0814  ?PROBNP 17,519*  ? ? ?  Latest Ref Rng & Units 04/24/2021  ?  5:17 AM 04/24/2021  ?  1:56 AM 04/23/2021  ?  5:54 PM  ?BMP  ?Glucose 70 - 99 mg/dL 123   124   170    ?BUN 8 - 23 mg/dL 84   85   78    ?Creatinine 0.61 - 1.24 mg/dL 2.13   2.29   1.94    ?Sodium 135 - 145 mmol/L 135   134   134    ?Potassium 3.5 - 5.1 mmol/L 3.5   3.5   2.9    ?Chloride 98 - 111 mmol/L 94   94   100    ?CO2 22 - 32 mmol/L _0 ?Calcium 8.9 - 10.3 mg/dL 8.7   8.6   7.1    ? ? ?  Latest Ref Rng & Units 04/22/2021  ? 12:20 PM 04/20/2021  ? 11:55 AM 02/10/2020  ? 10:55 AM  ?Hepatic Function  ?Total Protein 6.5 - 8.1 g/dL 6.9   6.9   6.5    ?Albumin 3.5 - 5.0  g/dL 3.4   3.5   3.8    ?AST 15 - 41 U/L 36   40   19    ?ALT 0 - 44 U/L _1 ?Alk Phosphatase 38 - 126 U/L 125   131   64    ?Total Bilirubin 0.3 - 1.2 mg/dL 2.0   3.1   1.0    ?Bilirubin, Direct 0.0 - 0.2 mg/dL 0.7      ? ? ?  Latest Ref Rng & Units 04/24/2021  ?  7:36 AM 04/20/2021  ? 11:55 AM 04/11/2021  ?  7:17 AM  ?CBC  ?WBC 4.0 - 10.5 K/uL 9.6   7.7     ?Hemoglobin 13.0 - 17.0 g/dL 14.0   14.0   14.6    ?Hematocrit 39.0 - 52.0 % 42.8  42.2   43.0    ?Platelets 150 - 400 K/uL 176   142     ? ?Lipid Panel  ?   ?Component Value Date/Time  ? CHOL 179 03/20/2019 0804  ? TRIG 61 03/20/2019 0804  ? HDL 51 03/20/2019 0804  ? LDLCALC 116 (H) 03/20/2019 0804  ? ?BNP (last 3 results) ?Recent Labs  ?  04/18/21 ?1428 04/20/21 ?1155 04/24/21 ?0517  ?BNP 1,882.3* 1,925.8* 1,094.6*  ? ? ?ProBNP (last 3 results) ?Recent Labs  ?  04/04/21 ?0814  ?PROBNP 17,519*  ? ? ?TSH ?Recent Labs  ?  12/06/20 ?1142  ?TSH 4.190  ? ? ?Imaging: ? ?Cardiac Studies: ? ?Echocardiogram 04/22/2021:  ?1. Left ventricular ejection fraction, by estimation, is <20%. The left ventricle has severely decreased function. The left ventricle demonstrates global hypokinesis. The left ventricular internal cavity size was dilated (LVIDd 7.4cm). Left ventricular  ?diastolic parameters are consistent with Grade III diastolic dysfunction (restrictive). Elevated left atrial pressure. The E/e' is 19.3. ? 2. Right ventricular systolic function is mildly reduced. The right ventricular size is normal. There is moderately elevated pulmonary artery systolic pressure. The estimated right ventricular systolic pressure is 53.7 mmHg. ? 3. Left atrial size was severely dilated. ? 4. Right atrial size was mild to moderately dilated. ? 5. Moderate pericardial effusion. The pericardial effusion is circumferential, mostly posterior. ? 6. The mitral valve is grossly normal. Severe mitral valve regurgitation. No evidence of mitral stenosis. ? 7. Tricuspid valve  regurgitation is moderate to severe. ? 8. The aortic valve is tricuspid. Aortic valve regurgitation is mild. Aortic valve sclerosis is present, with no evidence of aortic valve stenosis. ? ?Comparison(s): No significant change from prior study. ? ?Conclusion(s)/Recommendation(s): Findings consistent with dilated cardiomyopathy. No left ventricular mural or apical thrombus/thrombi. ? ?EKG: Underlying A. Fib with BV paced rhythm. ? ? ?Scheduled Meds: ? amiodarone  200 mg Oral BID  ? Followed by  ? [START ON 05/01/2021] amiodarone  200 mg Oral Daily  ? apixaban  5 mg Oral BID  ? Chlorhexidine Gluconate Cloth  6 each Topical Daily  ? pantoprazole  40 mg Oral QHS  ? polyethylene glycol  17 g Oral BID  ? senna-docusate  1 tablet Oral QHS  ? sodium chloride flush  10-40 mL Intracatheter Q12H  ? torsemide  40 mg Oral BID  ? ?Continuous Infusions: ? DOBUTamine 5 mcg/kg/min (04/23/21 1841)  ? ?PRN Meds:.sodium chloride flush ? ?Assessment/Plan:  ?1.  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  Persistent atrial fibrillation ?3.  Acute on chronic systolic heart failure ?4.  Chronic renal insufficiency stage IIIb-IV. ? ?Recommendation: Patient has done significantly well over the past 3 to 4 days, initially appeared to be dry and cool with low cardiac output however she has had approximately about 8 to 11 L of negative fluid balance since hospital admission.  Patient feels much improved.  No further JVD, infective mitral regurgitation murmur is not well appreciated on today's exam and there is no gallop.  Lungs are also clear to auscultate.  I will try to slowly wean him off of dobutamine over the next 24 to 48 hours, will reduce the dose to 2.5 mcg/kg/min.  Patient is not on any guideline directed medical therapy due to inability to tolerate the medications, co-Ox still borderline normal, hence would not initiate beta-blocker therapy.  Continue amiodarone only for now.  I will schedule him for  direct-current cardioversion tomorrow now that he is well compensated with   regard to fluids.  Renal function has returned back to baseline. ? ?With regard to disposition, patient prefers to go back home, he will need home health and home PT.  I am hoping that he will be discharged home in the next 2 to 3 days. ? ? ? ?Adrian Prows, MD, [facility] ?04/24/2021, 8:42 AM ?Office: [phone] ?Fax: [phone] ?Pager: [phone]  ?

## 2021-04-24 NOTE — Care Management Important Message (Signed)
Important Message ? ?Patient Details  ?Name: Miguel Patterson ?MRN: [id number] ?Date of Birth: 06-02-1945 ? ? ?Medicare Important Message Given:  Yes ? ? ? ? ?[name] ?04/24/2021, 8:48 AM ?

## 2021-04-25 ENCOUNTER — Inpatient Hospital Stay (HOSPITAL_COMMUNITY): Payer: Medicare Other | Admitting: Certified Registered Nurse Anesthetist

## 2021-04-25 ENCOUNTER — Encounter (HOSPITAL_COMMUNITY)
Admission: EM | Disposition: A | Discharge status: Home / Self Care | Payer: Self-pay | Source: Home / Self Care | Attending: Student in an Organized Health Care Education/Training Program

## 2021-04-25 ENCOUNTER — Encounter (HOSPITAL_COMMUNITY): Payer: Self-pay | Admitting: Student in an Organized Health Care Education/Training Program

## 2021-04-25 DIAGNOSIS — N179 Acute kidney failure, unspecified: Secondary | ICD-10-CM

## 2021-04-25 DIAGNOSIS — I4891 Unspecified atrial fibrillation: Secondary | ICD-10-CM

## 2021-04-25 DIAGNOSIS — I509 Heart failure, unspecified: Secondary | ICD-10-CM

## 2021-04-25 DIAGNOSIS — N189 Chronic kidney disease, unspecified: Secondary | ICD-10-CM

## 2021-04-25 HISTORY — PX: CARDIOVERSION: SHX1299

## 2021-04-25 LAB — CBC
HCT: 40.8 % (ref 39.0–52.0)
Hemoglobin: 13.8 g/dL (ref 13.0–17.0)
MCH: 29.8 pg (ref 26.0–34.0)
MCHC: 33.8 g/dL (ref 30.0–36.0)
MCV: 88.1 fL (ref 80.0–100.0)
Platelets: 183 10*3/uL (ref 150–400)
RBC: 4.63 MIL/uL (ref 4.22–5.81)
RDW: 18.9 % — ABNORMAL HIGH (ref 11.5–15.5)
WBC: 8.7 10*3/uL (ref 4.0–10.5)
nRBC: 0 % (ref 0.0–0.2)

## 2021-04-25 LAB — COOXEMETRY PANEL
Carboxyhemoglobin: 2 % — ABNORMAL HIGH (ref 0.5–1.5)
Methemoglobin: <0.7 % (ref 0.0–1.5)
O2 Saturation: 61.1 %
Total hemoglobin: 14 g/dL (ref 12.0–16.0)

## 2021-04-25 LAB — BASIC METABOLIC PANEL
Anion gap: 10 (ref 5–15)
BUN: 87 mg/dL — ABNORMAL HIGH (ref 8–23)
CO2: 30 mmol/L (ref 22–32)
Calcium: 8.7 mg/dL — ABNORMAL LOW (ref 8.9–10.3)
Chloride: 94 mmol/L — ABNORMAL LOW (ref 98–111)
Creatinine, Ser: 2.07 mg/dL — ABNORMAL HIGH (ref 0.61–1.24)
GFR, Estimated: 33 mL/min — ABNORMAL LOW (ref >60)
Glucose, Bld: 146 mg/dL — ABNORMAL HIGH (ref 70–99)
Potassium: 3.2 mmol/L — ABNORMAL LOW (ref 3.5–5.1)
Sodium: 134 mmol/L — ABNORMAL LOW (ref 135–145)

## 2021-04-25 SURGERY — CARDIOVERSION
Anesthesia: General

## 2021-04-25 MED ORDER — ETOMIDATE 2 MG/ML IV SOLN
INTRAVENOUS | Status: DC | PRN
  Administered 2021-04-25: 4 mg via INTRAVENOUS
  Administered 2021-04-25: 8 mg via INTRAVENOUS

## 2021-04-25 MED ORDER — POTASSIUM CHLORIDE 10 MEQ/100ML IV SOLN
10.0000 meq | INTRAVENOUS | Status: AC
  Administered 2021-04-25 (×3): 10 meq via INTRAVENOUS
  Filled 2021-04-25 (×3): qty 100

## 2021-04-25 MED ORDER — POTASSIUM CHLORIDE 10 MEQ/100ML IV SOLN: 10.0000 meq | INTRAVENOUS | Status: DC

## 2021-04-25 MED ORDER — POTASSIUM CHLORIDE 10 MEQ/100ML IV SOLN
10.0000 meq | INTRAVENOUS | Status: AC
  Administered 2021-04-25: 10 meq via INTRAVENOUS
  Filled 2021-04-25: qty 100

## 2021-04-25 NOTE — Anesthesia Postprocedure Evaluation (Signed)
Anesthesia Post Note ? ?Patient: Miguel Patterson ? ?Procedure(s) Performed: CARDIOVERSION ? ?  ? ?Patient location during evaluation: Endoscopy ?Anesthesia Type: General ?Level of consciousness: patient cooperative and awake ?Pain management: pain level controlled ?Vital Signs Assessment: post-procedure vital signs reviewed and stable ?Respiratory status: spontaneous breathing, nonlabored ventilation, respiratory function stable and patient connected to nasal cannula oxygen ?Cardiovascular status: blood pressure returned to baseline and stable ?Postop Assessment: no apparent nausea or vomiting ?Anesthetic complications: no ? ? ?No notable events documented. ? ?Last Vitals:  ?Vitals:  ? 04/25/21 1210 04/25/21 1230  ?BP:  108/66  ?Pulse:  73  ?Resp: 16 16  ?Temp:    ?SpO2:  100%  ?  ?Last Pain:  ?Vitals:  ? 04/25/21 1230  ?TempSrc:   ?PainSc: 0-No pain  ? ? ?  ?  ?  ?  ?  ?  ? ?Lorely Bubb ? ? ? ? ?

## 2021-04-25 NOTE — Progress Notes (Signed)
Paged for c/o SOB. VSS, satting 100% on RA. Evaluated patient at bedside. He states that he had his cardioversion earlier today and was doing well for a while, but then he noticed that he was feeling more short of breath this evening and was unable to lie flat.  However, now he states he feels "much better."  Review of records from this morning shows that patient was also dyspneic with NYHA class III-4 symptoms. ? ?Exam: Heart rate in the 70s, satting 100% on room air.  Lungs are clear to auscultation bilaterally.  No BLE edema.  ? ?Plan: ?No s/s DVT/PE; and patient is already anticoagulated with apixaban. Pt appears euvolemic on exam, states he is doing much better now and just wants to sleep. Will CTM. ?

## 2021-04-25 NOTE — Progress Notes (Signed)
Physical Therapy Treatment ?Patient Details ?Name: Miguel Patterson ?MRN: [id number] ?DOB: 1945-12-19 ?Today's Date: 04/25/2021 ? ? ?History of Present Illness 76 yo male admitted 4/20 from cardiologist office due to sudden SOB. Found to have acute on chronic CHF and CKD, dilated cardiomyopathy. Pt underwent cardioversion 4/11, back in afib at office visit on 4/18. LE cellulitis and volume overload being treated prior to admission. PLan for cardioversion 4/25. PMH includes: CHF, CKD IV, AICD, LBBB, ICD, severe MR. ? ?  ?PT Comments  ? ? Patient progressing well towards PT goals. Session focused on higher level balance, gait progression and stair training. Scored 20/24 on DGI indicating pt is not at increased risk for falls. Reports feeling at about 5% of baseline from a few months ago but otherwise 60-70% of new baseline in the last month. Tolerated stair training with supervision for safety and use of rail for support. Noted to have 2/4 DOE with walking but VSS and taking rest breaks as needed. Plan for cardioversion today per report. Will follow and progress mobility and higher level balance activities prior to d/c. Pt interested in cardiopulmonary rehab at d/c. ?   ?Recommendations for follow up therapy are one component of a multi-disciplinary discharge planning process, led by the attending physician.  Recommendations may be updated based on patient status, additional functional criteria and insurance authorization. ? ?Follow Up Recommendations ? Other (comment) (cardiopulmonary rehab) ?  ?  ?Assistance Recommended at Discharge Intermittent Supervision/Assistance  ?Patient can return home with the following Assistance with cooking/housework;Help with stairs or ramp for entrance ?  ?Equipment Recommendations ? None recommended by PT  ?  ?Recommendations for Other Services   ? ? ?  ?Precautions / Restrictions Precautions ?Precautions: None ?Restrictions ?Weight Bearing Restrictions: No  ?  ? ?Mobility ? Bed  Mobility ?Overal bed mobility: Modified Independent ?  ?  ?  ?  ?  ?  ?General bed mobility comments: mildly increased time due to pt caution with lines and use of momentum to elevate trunk ?  ? ?Transfers ?Overall transfer level: Needs assistance ?Equipment used: None ?Transfers: Sit to/from Stand ?Sit to Stand: Supervision ?  ?  ?  ?  ?  ?General transfer comment: supervision for safety and line management, stood from EOB x1. ?  ? ?Ambulation/Gait ?Ambulation/Gait assistance: Min guard, Supervision ?Gait Distance (Feet): 300 Feet ?Assistive device: None ?Gait Pattern/deviations: Step-through pattern, Decreased stride length, Drifts right/left ?  ?Gait velocity interpretation: 1.31 - 2.62 ft/sec, indicative of limited community ambulator ?  ?General Gait Details: Slow, mostly steady gait (able to increase speed towards end of walk). 2/4 DOE post stairs but VSS. Tolerated higher level balance challenges, seebalance section for details. VSS on RA. ? ? ?Stairs ?Stairs: Yes ?Stairs assistance: Supervision ?Stair Management: One rail Left ?Number of Stairs: 3 ?General stair comments: Alternating step pattern to ascend/descend steps with supervision for safety. Reports some difficulty ascending steps. ? ? ?Wheelchair Mobility ?  ? ?Modified Rankin (Stroke Patients Only) ?  ? ? ?  ?Balance Overall balance assessment: Needs assistance ?Sitting-balance support: Feet supported, No upper extremity supported ?Sitting balance-Leahy Scale: Good ?Sitting balance - Comments: Assist to pick up sock from floor sitting EOB, able to bring feet up on bed to donn socks with some difficulty and increased time. ?  ?Standing balance support: During functional activity ?Standing balance-Leahy Scale: Fair ?  ?  ?  ?  ?  ?  ?  ?High level balance activites: Backward walking, Direction changes ?High  Level Balance Comments: Tolerated above with only mild deviations in gait but no overt LOB. ?Standardized Balance Assessment ?Standardized Balance  Assessment : Dynamic Gait Index ?  ?Dynamic Gait Index ?Level Surface: Normal ?Change in Gait Speed: Normal ?Gait with Horizontal Head Turns: Normal ?Gait with Vertical Head Turns: Normal ?Gait and Pivot Turn: Mild Impairment ?Step Over Obstacle: Moderate Impairment ?Step Around Obstacles: Normal ?Steps: Mild Impairment ?Total Score: 20 ?  ? ?  ?Cognition Arousal/Alertness: Awake/alert ?Behavior During Therapy: [facility] for tasks assessed/performed ?Overall Cognitive Status: Within Functional Limits for tasks assessed ?  ?  ?  ?  ?  ?  ?  ?  ?  ?  ?  ?  ?  ?  ?  ?  ?  ?  ?  ? ?  ?Exercises   ? ?  ?General Comments General comments (skin integrity, edema, etc.): VSS on RA with activity. ?  ?  ? ?Pertinent Vitals/Pain Pain Assessment ?Pain Assessment: No/denies pain  ? ? ?Home Living   ?  ?  ?  ?  ?  ?  ?  ?  ?  ?   ?  ?Prior Function    ?  ?  ?   ? ?PT Goals (current goals can now be found in the care plan section) Progress towards PT goals: Progressing toward goals ? ?  ?Frequency ? ? ? Min 3X/week ? ? ? ?  ?PT Plan Discharge plan needs to be updated  ? ? ?Co-evaluation   ?  ?  ?  ?  ? ?  ?AM-PAC PT "6 Clicks" Mobility   ?Outcome Measure ? Help needed turning from your back to your side while in a flat bed without using bedrails?: None ?Help needed moving from lying on your back to sitting on the side of a flat bed without using bedrails?: None ?Help needed moving to and from a bed to a chair (including a wheelchair)?: A Little ?Help needed standing up from a chair using your arms (e.g., wheelchair or bedside chair)?: A Little ?Help needed to walk in hospital room?: A Little ?Help needed climbing 3-5 steps with a railing? : A Little ?6 Click Score: 20 ? ?  ?End of Session Equipment Utilized During Treatment: Gait belt ?Activity Tolerance: Patient tolerated treatment well ?Patient left: in bed;with call bell/phone within reach ?Nurse Communication: Mobility status ?PT Visit Diagnosis: Other abnormalities of gait and  mobility (R26.89) ?  ? ? ?Time: 6387-5643 ?PT Time Calculation (min) (ACUTE ONLY): 33 min ? ?Charges:  $Gait Training: 8-22 mins ?$Neuromuscular Re-education: 8-22 mins          ?          ? ?Marisa Severin, PT, DPT ?Acute Rehabilitation Services ?Secure chat preferred ?Office [phone] ? ? ? ? ? ?[place] ?04/25/2021, 9:24 AM ? ?

## 2021-04-25 NOTE — Progress Notes (Signed)
Received call from Endo, transport will be arriving at 1130 to take patient for cardioversion. ?

## 2021-04-25 NOTE — Anesthesia Preprocedure Evaluation (Signed)
Anesthesia Evaluation  ?Patient identified by MRN, date of birth, ID band ?Patient awake ? ? ? ?Reviewed: ?Allergy & Precautions, NPO status , Patient's Chart, lab work & pertinent test results ? ?History of Anesthesia Complications ?Negative for: history of anesthetic complications ? ?Airway ?Mallampati: III ? ?TM Distance: >3 FB ?Neck ROM: Full ? ? ? Dental ? ?(+) Dental Advisory Given, Teeth Intact ?  ?Pulmonary ?shortness of breath, sleep apnea ,  ?  ? ?+ decreased breath sounds ? ? ? ? ? Cardiovascular ?+CHF  ?+ dysrhythmias + Cardiac Defibrillator ? ?Rhythm:Irregular  ?1. Left ventricular ejection fraction, by estimation, is <20%. The left  ?ventricle has severely decreased function. The left ventricle demonstrates  ?global hypokinesis. The left ventricular internal cavity size was dilated  ?(LVIDd 7.4cm). Left ventricular  ?diastolic parameters are consistent with Grade III diastolic dysfunction  ?(restrictive). Elevated left atrial pressure. The E/e' is 19.3.  ??2. Right ventricular systolic function is mildly reduced. The right  ?ventricular size is normal. There is moderately elevated pulmonary artery  ?systolic pressure. The estimated right ventricular systolic pressure is  ?44.9 mmHg.  ??3. Left atrial size was severely dilated.  ??4. Right atrial size was mild to moderately dilated.  ??5. Moderate pericardial effusion. The pericardial effusion is  ?circumferential, mostly posterior.  ??6. The mitral valve is grossly normal. Severe mitral valve regurgitation.  ?No evidence of mitral stenosis.  ??7. Tricuspid valve regurgitation is moderate to severe.  ??8. The aortic valve is tricuspid. Aortic valve regurgitation is mild.  ?Aortic valve sclerosis is present, with no evidence of aortic valve  ?  ?Neuro/Psych ?negative neurological ROS ? negative psych ROS  ? GI/Hepatic ?negative GI ROS, Neg liver ROS,   ?Endo/Other  ?negative endocrine ROS ? Renal/GU ?ARF and CRFRenal  diseaseLab Results ?     Component                Value               Date                 ?     CREATININE               2.07 (H)            04/25/2021           ?  ? ?  ?Musculoskeletal ?negative musculoskeletal ROS ?(+)  ? Abdominal ?  ?Peds ? Hematology ? ?(+) Blood dyscrasia, , eliquis ? ?Lab Results ?     Component                Value               Date                 ?     WBC                      8.7                 04/25/2021           ?     HGB                      13.8                04/25/2021           ?     HCT  40.8                04/25/2021           ?     MCV                      88.1                04/25/2021           ?     PLT                      183                 04/25/2021           ?   ?Anesthesia Other Findings ?On dobutamine gtt ? Reproductive/Obstetrics ? ?  ? ? ? ? ? ? ? ? ? ? ? ? ? ?  ?  ? ? ? ? ? ? ? ? ?Anesthesia Physical ?Anesthesia Plan ? ?ASA: 4 ? ?Anesthesia Plan: General  ? ?Post-op Pain Management: Minimal or no pain anticipated  ? ?Induction: Intravenous ? ?PONV Risk Score and Plan: 2 ? ?Airway Management Planned: Mask ? ?Additional Equipment: None ? ?Intra-op Plan:  ? ?Post-operative Plan:  ? ?Informed Consent: I have reviewed the patients History and Physical, chart, labs and discussed the procedure including the risks, benefits and alternatives for the proposed anesthesia with the patient or authorized representative who has indicated his/her understanding and acceptance.  ? ? ? ?Dental advisory given ? ?Plan Discussed with: CRNA ? ?Anesthesia Plan Comments:   ? ? ? ? ? ? ?Anesthesia Quick Evaluation ? ?

## 2021-04-25 NOTE — Interval H&P Note (Signed)
History and Physical Interval Note: ? ?04/25/2021 ?11:42 AM ? ?Miguel Patterson  has presented today for surgery, with the diagnosis of atrial fib.  The various methods of treatment have been discussed with the patient and family. After consideration of risks, benefits and other options for treatment, the patient has consented to  Procedure(s): ?CARDIOVERSION (N/A) as a surgical intervention.  The patient's history has been reviewed, patient examined, no change in status, stable for surgery.  I have reviewed the patient's chart and labs.  Questions were answered to the patient's satisfaction.   ? ? ?Adrian Prows ? ? ?

## 2021-04-25 NOTE — Progress Notes (Signed)
Pt. C/o SOB. Lung sounds clear/diminished. VSS. O2 100 on Room air. On call for IMTS paged to make aware.  ?

## 2021-04-25 NOTE — Transfer of Care (Signed)
Immediate Anesthesia Transfer of Care Note ? ?Patient: Miguel Patterson ? ?Procedure(s) Performed: CARDIOVERSION ? ?Patient Location: Endoscopy Unit ? ?Anesthesia Type:General ? ?Level of Consciousness: drowsy ? ?Airway & Oxygen Therapy: Patient Spontanous Breathing ? ?Post-op Assessment: Report given to RN and Post -op Vital signs reviewed and stable ? ?Post vital signs: Reviewed and stable ? ?Last Vitals:  ?Vitals Value Taken Time  ?BP 129/108 04/25/21 1137  ?Temp 36.2 ?C 04/25/21 1137  ?Pulse 74 04/25/21 1137  ?Resp 23 04/25/21 1137  ?SpO2 100 % 04/25/21 1137  ? ? ?Last Pain:  ?Vitals:  ? 04/25/21 1137  ?TempSrc: Temporal  ?PainSc: 0-No pain  ?   ? ?  ? ?Complications: No notable events documented. ?

## 2021-04-25 NOTE — Progress Notes (Signed)
Subjective:  ?Patient feels much improved. I have discussed with him regarding cardioversion and he is willing. Has had excellent UOP and renal function improving and now on 2.23mg Dobut.  ? ?Intake/Output from previous day: ? ?I/O last 3 completed shifts: ?In: 1147.8 [P.O.:960; I.V.:187.8] ?Out: 2201 [Urine:2200; Stool:1] ?No intake/output data recorded. ? ? ?Intake/Output Summary (Last 24 hours) at 04/25/2021 1058 ?Last data filed at 04/25/2021 0304 ?Gross per 24 hour  ?Intake 588.85 ml  ?Output 1150 ml  ?Net -561.15 ml  ? ? ?Blood pressure 92/66, pulse 77, temperature (!) 97.4 ?F (36.3 ?C), temperature source Oral, resp. rate 18, height _0  (1.778 m), weight 64.9 kg, SpO2 100 %. ?Physical Exam ?Neck:  ?   Vascular: No JVD.  ?Cardiovascular:  ?   Rate and Rhythm: Normal rate and regular rhythm.  ?   Pulses: Intact distal pulses.  ?   Heart sounds: Normal heart sounds. No murmur heard. ?  No gallop.  ?   Comments: Bilateral varicose veins in lower extremity noted ?Pulmonary:  ?   Effort: Pulmonary effort is normal.  ?   Breath sounds: Normal breath sounds.  ?Abdominal:  ?   General: Bowel sounds are normal.  ?   Palpations: Abdomen is soft.  ?Musculoskeletal:  ?   Right lower leg: No edema.  ?   Left lower leg: No edema.  ? ? ?Lab Results: ?BMP ?BNP (last 3 results) ?Recent Labs  ?  04/18/21 ?1428 04/20/21 ?1155 04/24/21 ?01884 ?BNP 1,882.3* 1,925.8* 1,094.6*  ? ? ? ?ProBNP (last 3 results) ?Recent Labs  ?  04/04/21 ?0814  ?PROBNP 17,519*  ? ? ? ?  Latest Ref Rng & Units 04/25/2021  ?  3:27 AM 04/24/2021  ?  5:43 PM 04/24/2021  ?  5:17 AM  ?BMP  ?Glucose 70 - 99 mg/dL 146   161   123    ?BUN 8 - 23 mg/dL 87   86   84    ?Creatinine 0.61 - 1.24 mg/dL 2.07   2.22   2.13    ?Sodium 135 - 145 mmol/L 134   132   135    ?Potassium 3.5 - 5.1 mmol/L 3.2   3.9   3.5    ?Chloride 98 - 111 mmol/L 94   92   94    ?CO2 22 - 32 mmol/L _1 ?Calcium 8.9 - 10.3 mg/dL 8.7   8.7   8.7    ? ? ?  Latest Ref Rng & Units  04/22/2021  ? 12:20 PM 04/20/2021  ? 11:55 AM 02/10/2020  ? 10:55 AM  ?Hepatic Function  ?Total Protein 6.5 - 8.1 g/dL 6.9   6.9   6.5    ?Albumin 3.5 - 5.0 g/dL 3.4   3.5   3.8    ?AST 15 - 41 U/L 36   40   19    ?ALT 0 - 44 U/L _2 ?Alk Phosphatase 38 - 126 U/L 125   131   64    ?Total Bilirubin 0.3 - 1.2 mg/dL 2.0   3.1   1.0    ?Bilirubin, Direct 0.0 - 0.2 mg/dL 0.7      ? ? ?  Latest Ref Rng & Units 04/25/2021  ?  3:27 AM 04/24/2021  ?  7:36 AM 04/20/2021  ? 11:55 AM  ?CBC  ?WBC 4.0 - 10.5 K/uL 8.7  9.6   7.7    ?Hemoglobin 13.0 - 17.0 g/dL 13.8   14.0   14.0    ?Hematocrit 39.0 - 52.0 % 40.8   42.8   42.2    ?Platelets 150 - 400 K/uL 183   176   142    ? ?Lipid Panel  ?   ?Component Value Date/Time  ? CHOL 179 03/20/2019 0804  ? TRIG 61 03/20/2019 0804  ? HDL 51 03/20/2019 0804  ? [place] 116 (H) 03/20/2019 0804  ? ?BNP (last 3 results) ?Recent Labs  ?  04/18/21 ?1428 04/20/21 ?1155 04/24/21 ?9470  ?BNP 1,882.3* 1,925.8* 1,094.6*  ? ? ? ?ProBNP (last 3 results) ?Recent Labs  ?  04/04/21 ?0814  ?PROBNP 17,519*  ? ? ? ?TSH ?Recent Labs  ?  12/06/20 ?1142  ?TSH 4.190  ? ? ? ?Imaging: ? ?Cardiac Studies: ? ?Echocardiogram 04/22/2021:  ?1. Left ventricular ejection fraction, by estimation, is <20%. The left ventricle has severely decreased function. The left ventricle demonstrates global hypokinesis. The left ventricular internal cavity size was dilated (LVIDd 7.4cm). Left ventricular  ?diastolic parameters are consistent with Grade III diastolic dysfunction (restrictive). Elevated left atrial pressure. The E/e' is 19.3. ? 2. Right ventricular systolic function is mildly reduced. The right ventricular size is normal. There is moderately elevated pulmonary artery systolic pressure. The estimated right ventricular systolic pressure is 96.2 mmHg. ? 3. Left atrial size was severely dilated. ? 4. Right atrial size was mild to moderately dilated. ? 5. Moderate pericardial effusion. The pericardial effusion is  circumferential, mostly posterior. ? 6. The mitral valve is grossly normal. Severe mitral valve regurgitation. No evidence of mitral stenosis. ? 7. Tricuspid valve regurgitation is moderate to severe. ? 8. The aortic valve is tricuspid. Aortic valve regurgitation is mild. Aortic valve sclerosis is present, with no evidence of aortic valve stenosis. ? ?Comparison(s): No significant change from prior study. ? ?Conclusion(s)/Recommendation(s): Findings consistent with dilated cardiomyopathy. No left ventricular mural or apical thrombus/thrombi. ? ?EKG: Underlying A. Fib with BV paced rhythm. ? ? ?Scheduled Meds: ? amiodarone  200 mg Oral BID  ? Followed by  ? [START ON 05/01/2021] amiodarone  200 mg Oral Daily  ? apixaban  5 mg Oral BID  ? Chlorhexidine Gluconate Cloth  6 each Topical Daily  ? pantoprazole  40 mg Oral QHS  ? polyethylene glycol  17 g Oral BID  ? senna-docusate  1 tablet Oral QHS  ? sodium chloride flush  10-40 mL Intracatheter Q12H  ? torsemide  40 mg Oral BID  ? ?Continuous Infusions: ? sodium chloride    ? DOBUTamine 2.5 mcg/kg/min (04/25/21 0054)  ? potassium chloride 10 mEq (04/25/21 1019)  ? ?PRN Meds:.sodium chloride flush ? ?Assessment/Plan:  ?1.  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  Persistent atrial fibrillation ?3.  Acute on chronic systolic heart failure with severe LV systolic dysfunction ?4.  Chronic renal insufficiency stage IIIb-IV. ? ?Recommendation: Patient has done significantly well over the past 3 to 4 day. Lungs are also clear to auscultate.  I will try to slowly wean him off of dobutamine, will reduce the dose to 1.0 mcg/kg/min.  Patient is not on any guideline directed medical therapy due to inability to tolerate the medications. ? ?Co-Ox still borderline normal but clearly improved, will consider Coreg 3.125 mg 0.5 tab BID in a few days once off Dobutamine.  ? ?Discharge planning is on and I appreciate primary team helping me with this and  coordination of care.  ? ? ? ?Adrian Prows, MD, [facility] ?04/25/2021, 10:58 AM ?Office: [phone] ?Fax: [phone] ?Pager: [phone]  ?

## 2021-04-25 NOTE — Progress Notes (Addendum)
? 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 No acute overnight events. Patient was evaluated at bedside today and he states he continue to feel improved. He has been evaluated by PT and states he wishes to be discharged home instead of a nursing home and will have his son at home to help him.  ? ?Objective: ? ?Vital signs in last 24 hours: ?Vitals:  ? 04/24/21 0704 04/24/21 1100 04/24/21 2238 04/25/21 0304  ?BP: 1_0 92/66  ?Pulse: 74 77    ?Resp: _1 ?Temp: 97.8 ?F (36.6 ?C) 98.2 ?F (36.8 ?C) (!) 97.5 ?F (36.4 ?C) (!) 97.4 ?F (36.3 ?C)  ?TempSrc: Axillary Oral Oral Oral  ?SpO2: 100% 100% 100% 100%  ?Weight:      ?Height:      ? ?Physical Exam ?HENT:  ?   Head: Normocephalic.  ?Neck:  ?   Vascular: No JVD.  ?Cardiovascular:  ?   Rate and Rhythm: Rhythm irregular.  ?   Heart sounds: Murmur heard.  ?   Comments: Holosystolic murmur at apex ?Pulmonary:  ?   Effort: Pulmonary effort is normal.  ?   Breath sounds: Normal breath sounds.  ?Skin: ?   General: Skin is warm and dry.  ?Neurological:  ?   General: No focal deficit present.  ?   Mental Status: He is alert.  ?Psychiatric:     ?   Mood and Affect: Mood normal.     ?   Behavior: Behavior normal.  ?  ? ?Weight change:  ? ?Intake/Output Summary (Last 24 hours) at 04/25/2021 1135 ?Last data filed at 04/25/2021 0304 ?Gross per 24 hour  ?Intake 588.85 ml  ?Output 1150 ml  ?Net -561.15 ml  ? ?CBC ?   ?Component Value Date/Time  ? WBC 8.7 04/25/2021 0327  ? RBC 4.63 04/25/2021 0327  ? HGB 13.8 04/25/2021 0327  ? HGB 13.2 12/06/2020 1142  ? HCT 40.8 04/25/2021 0327  ? HCT 39.7 12/06/2020 1142  ? PLT 183 04/25/2021 0327  ? PLT 150 12/06/2020 1142  ? MCV 88.1 04/25/2021 0327  ? MCV 87 12/06/2020 1142  ? MCH 29.8 04/25/2021 0327  ? MCHC 33.8 04/25/2021 0327  ? RDW 18.9 (H)  04/25/2021 0327  ? RDW 13.3 12/06/2020 1142  ? LYMPHSABS 0.8 04/20/2021 1155  ? MONOABS 0.5 04/20/2021 1155  ? EOSABS 0.0 04/20/2021 1155  ? BASOSABS 0.1 04/20/2021 1155  ?  ? ? ?  Latest Ref Rng & Units 04/22/2021  ?  5:10 AM 04/23/2021  ?  4:10 AM 04/23/2021  ?  6:40 PM 04/24/2021  ?  5:17 AM 04/25/2021  ?  3:27 AM  ?Total Hgb  ?Total Hgb 12.0 - 16.0 g/dL 14.0   14.5   15.1   14.6   14.0    ? ? ?  Latest Ref Rng & Units 04/22/2021  ?  5:10 AM 04/23/2021  ?  4:10 AM 04/23/2021  ?  6:40 PM 04/24/2021  ?  5:17 AM 04/25/2021  ?  3:27 AM  ?O2 Sat  ?O2 Sat % 63.7   58.9   51   60.2   61.1    ? ? ?  Latest Ref Rng & Units 04/22/2021  ?  5:10 AM 04/23/2021  ?  4:10 AM 04/23/2021  ?  6:40 PM 04/24/2021  ?  5:17 AM 04/25/2021  ?  3:27 AM  ?Carboxy Hgb  ?Carboxy Hgb 0.5 - 1.5 % 1.8   1.6   1.2   1.9   2.0    ? ? ?  Latest Ref Rng & Units 04/22/2021  ?  5:10 AM 04/23/2021  ?  4:10 AM 04/23/2021  ?  6:40 PM 04/24/2021  ?  5:17 AM 04/25/2021  ?  3:27 AM  ?Met Hgb  ?Met Hgb 0.0 - 1.5 % <0.7   <0.7   2.0   1.5   <0.7    ? ? ?  04/23/2021  ?  5:51 PM 04/23/2021  ?  8:15 PM 04/24/2021  ? 10:13 AM 04/24/2021  ? 10:34 PM 04/25/2021  ?  2:13 AM  ?CVP  ?CVP (mmHg) 6 mmHg 6 mmHg 4 mmHg 5 mmHg 5 mmHg  ? ? ?Assessment/Plan: ? ?Principal Problem: ?  Cardiogenic shock ([place]) ?Active Problems: ?  Paroxysmal atrial fibrillation ([place]) ?  Acute on chronic HFrEF (heart failure with reduced ejection fraction) ([place]) ?  Acute renal failure superimposed on stage 3a chronic kidney disease ([place]) ? ?Mr. Miguel Patterson ia a 76 y.o. male with PMH significant for non-compaction cardiomyopathy, severe valvular disease, HFrEF, atrial fibrillation, and acute on chronic kidney injury secondary to cardiorenal syndrome who has been admitted for cardiogenic shock. ? ?Cardiogenic shock ?Noncompaction cardiomyopathy ?Valvular disease ?Acute on Chronic HFrEF ?-Patient has been tolerating his dobutamine weaned down to 2.5. Cardiology team has further weaned him down to 1.0 mcg/kg/min today.  ?-He  continues to have good urine output on Torsemide 40 mg BID.  ?-O2 from Co-ox remains improved at 61.1% on Dobutamine 2.5, increased from 60% yesterday. Per cardiology team, patient will transition to Coreg 3.125 mg 0.5 tablet BID once patient is fully weaned off of dobutamine.  ?-CVP stable at 5. Fick's score remains low at cardiac output of 2.9 and cardiac index of 1.6.  ?-Will continue diureses with Torsemide.   ? ?Acute on chronic kidney injury 3B secondary to cardiorenal syndrome ?-Mild improvements in his Cr at 2.07, down from 2.22 yesterday.  ?-Will continue to assess BUN and Cr with his dobutamine weaned down.  ?-Patient is hypokalemic at 3.2 and has received 10 mEq of potassium earlier today.  ?-Will give an additional 10 mEq Q1 Hr x 4  ? ?Atrial fibrillation  ?-Patient underwent a successful cardioversion today with Dr. Einar Gip with sinus activity confirmed via ICD interrogation.  ?-He is currently on amiodarone as well as Eliquis for anticoagulation therapy.  ? ? ? LOS: 5 days  ? ?Miguel Patterson, Medical Student ?04/25/2021, 11:35 AM ?Pager [phone] ? ? ?

## 2021-04-25 NOTE — CV Procedure (Signed)
Scheduled  In Hospital ICD Check and reprogramming 04/25/21  ?Single (S)/Dual (D)/BV (M) B ?Presenting ASBVP ?Pacer dependant: No. Underlying AFVS. AP 0%, BP 100.% ?AT/AF burden 100% since Jan 2023 except transient ?HVR 0  ?Longevity 5 Years/Voltage.  ?Lead measurements: Stable ?Histogram: Low (L)/normal (N)/high (H)  Stable Patient activity Low. ?Thoracic impedance: Normal limits last few days ? ?Observations: Patient in persistent AF.  Changes:  ?Turned on ATP (atrial).  Kept other parameters intact. ? ? ?Cardioversion: Using Etomomidate 12 mg for deep sedation, 150J x 1 synchronized DCCV performed and sinus activity confirmed via ICD interrogation.  ? ?ICD programming change performed as above. ? ?Patient tolerated the procedure well.  ? ? ?Adrian Prows, MD, [facility] ?04/25/2021, 12:00 PM ?Office: [phone] ?Fax: [phone] ?Pager: [phone]  ?

## 2021-04-25 NOTE — Progress Notes (Signed)
OT Cancellation Note ? ?Patient Details ?Name: Miguel Patterson ?MRN: [id number] ?DOB: 1945/10/05 ? ? ?Cancelled Treatment:    Reason Eval/Treat Not Completed: Patient at procedure or test/ unavailable (Will return as schedule allows.) ? ?[name] ?[name] MSOT, OTR/L ?Acute Rehab ?Pager: [phone] ?Office: [phone] ?04/25/2021, 11:37 AM ?

## 2021-04-26 ENCOUNTER — Encounter (HOSPITAL_COMMUNITY): Payer: Medicare Other

## 2021-04-26 ENCOUNTER — Encounter (HOSPITAL_COMMUNITY): Payer: Self-pay | Admitting: Cardiology

## 2021-04-26 DIAGNOSIS — Z66 Do not resuscitate: Secondary | ICD-10-CM | POA: Diagnosis not present

## 2021-04-26 DIAGNOSIS — N170 Acute kidney failure with tubular necrosis: Secondary | ICD-10-CM | POA: Diagnosis not present

## 2021-04-26 DIAGNOSIS — I509 Heart failure, unspecified: Secondary | ICD-10-CM

## 2021-04-26 DIAGNOSIS — N1831 Chronic kidney disease, stage 3a: Secondary | ICD-10-CM

## 2021-04-26 DIAGNOSIS — R570 Cardiogenic shock: Secondary | ICD-10-CM | POA: Diagnosis not present

## 2021-04-26 DIAGNOSIS — I480 Paroxysmal atrial fibrillation: Secondary | ICD-10-CM | POA: Diagnosis not present

## 2021-04-26 DIAGNOSIS — Z515 Encounter for palliative care: Secondary | ICD-10-CM

## 2021-04-26 LAB — BASIC METABOLIC PANEL
Anion gap: 11 (ref 5–15)
BUN: 85 mg/dL — ABNORMAL HIGH (ref 8–23)
CO2: 29 mmol/L (ref 22–32)
Calcium: 8.7 mg/dL — ABNORMAL LOW (ref 8.9–10.3)
Chloride: 92 mmol/L — ABNORMAL LOW (ref 98–111)
Creatinine, Ser: 2.45 mg/dL — ABNORMAL HIGH (ref 0.61–1.24)
GFR, Estimated: 27 mL/min — ABNORMAL LOW (ref >60)
Glucose, Bld: 138 mg/dL — ABNORMAL HIGH (ref 70–99)
Potassium: 4.4 mmol/L (ref 3.5–5.1)
Sodium: 132 mmol/L — ABNORMAL LOW (ref 135–145)

## 2021-04-26 LAB — COOXEMETRY PANEL
Carboxyhemoglobin: 1.1 % (ref 0.5–1.5)
Methemoglobin: <0.7 % (ref 0.0–1.5)
O2 Saturation: 41.5 %
Total hemoglobin: 14.6 g/dL (ref 12.0–16.0)

## 2021-04-26 MED ORDER — TORSEMIDE 20 MG PO TABS
20.0000 mg | ORAL_TABLET | Freq: Two times a day (BID) | ORAL | Status: DC
  Administered 2021-04-26 – 2021-04-28 (×5): 20 mg via ORAL
  Filled 2021-04-26 (×5): qty 1

## 2021-04-26 NOTE — Consult Note (Signed)
? ?                                                                                ?Consultation Note ?Date: 04/26/2021  ? ?Patient Name: Miguel Patterson  ?DOB: 1945/07/31  MRN: [id number]  Age / Sex: 76 y.o., male  ?PCP: Nickola Major, MD ?Referring Physician: Axel Filler, * ? ?Reason for Consultation: Establishing goals of care ? ?HPI/Patient Profile: 76 y.o. male  with past medical history of severe heart failure with ICD, CKD (stage IIIb), constipation, acid reflux admitted on 04/20/2021 with dyspnea and orthopnea.  He was treated for fluid overload and cardiogenic shock. He has had significant fluid output and feels much improved since admission. He was cardioverted yesterday without complication. Plan is to attempt ween off of dobutamine or d/c with it. ? ?Clinical Assessment and Goals of Care: ?I have reviewed medical records including EPIC notes, labs and imaging, assessed the patient and then met with patient and his son [name]  to discuss diagnosis prognosis, [place], EOL wishes, disposition and options. ? ?I introduced Palliative Medicine as specialized medical care for people living with serious illness. It focuses on providing relief from the symptoms and stress of a serious illness. The goal is to improve quality of life for both the patient and the family. ? ?We discussed a brief life review of the patient.  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  He retired from this position due to his health.  He endorses running several miles last fall prior to his health taking a significant decline in September. He has 3 children and has been divorced from their mother for over 60 years.  ? ?As far as functional and nutritional status patient endorses he is completely independent with all ADLs.  He lives alone, does his own grocery shopping and driving, and was very active prior to this  admission. ? ?We discussed patient's current illness and what it means in the larger context of patient's on-going co-morbidities. I attempted to elicit values and goals of care important to the patient.  Patient says he wants to be as active as possible for as long as he can.  He verbalizes that his heart failure is at end-stage and that he will not get better.   ? ?Advance directives, concepts specific to code status, artificial feeding and hydration, and rehospitalization were considered and discussed. MOST from introduced and discussed. Copy left for patient and son to review. Patient verbalized he would never want a feeding tube or to be intubated/on life support.  ? ?Reviewed pt's wishes to allow a natural death and avoid CPR/code blue/defibrillation should he suffer a cardiopulmonary arrest. DNR remains. Gold form completed and placed in paper chart. RN made aware to place purple wristband. ? ?Patient has a living will that currently names his daughter as his executor and Media planner.  However, patient verbalizes he would like to change that so that his son is now his executor and Media planner.  We discussed that only healthcare power of attorney is can be formally completed  while hospitalized.  Patient said he would like to complete this.  Spiritual care consult placed to have ACP documents completed.  I gave patient a copy of the [place] ACP documents and advised him to review of prior to meeting with spiritual care. ? ?Patient shares that he experiences shortness of breath and feeling off frequently when he is not engaged in conversation with others.  I discussed that while heart failure is a physical condition it also has a mental and spiritual effect on the body.  Patient acknowledged that he is frustrated and agitated that his body his this condition but he denies anxiety and panic. ?  ?We reviewed that pt may go home with dobutamine. He stated he knows this medication is the "last ditch  effort" to help his kidneys and heart function. He says he lives in the moment and is willing to take this "last resort" medication at home. WE discussed that as a last resort medication his options may be limited in the future as far as blood pressure support. He says he knows his heart is not strong but he is feeling very well today. He is feeling much better than when he came in and is choosing to focus on that.  ? ?Discussed with patient/family the importance of continued conversation with family and the medical providers regarding overall plan of care and treatment options, ensuring decisions are within the context of the patient?s values and GOCs.   ? ?Questions and concerns were addressed. The family was encouraged to call with questions or concerns.  ? ?Primary Decision Maker ?PATIENT ? ?Code Status/Advance Care Planning: ?DNR ? ?Prognosis:   ?Unable to determine ? ?Discharge Planning: Home with Palliative Services ? ?Primary Diagnoses: ?Present on Admission: ? Acute on chronic HFrEF (heart failure with reduced ejection fraction) ([place]) ? Paroxysmal atrial fibrillation (HCC) ? Cardiogenic shock ([place]) ? Acute renal failure superimposed on stage 3a chronic kidney disease ([place]) ? ? ?Physical Exam ?Vitals reviewed.  ?Constitutional:   ?   General: He is not in acute distress. ?   Appearance: He is not ill-appearing.  ?HENT:  ?   Head: Normocephalic and atraumatic.  ?Cardiovascular:  ?   Rate and Rhythm: Normal rate.  ?Pulmonary:  ?   Effort: Pulmonary effort is normal.  ?   Comments: SOB with significant discussions, but able to recover quickly ?Chest:  ?   Chest wall: No mass or tenderness.  ?Abdominal:  ?   Palpations: Abdomen is soft.  ?Musculoskeletal:  ?   Comments: Dyspnea with exertion, MAETC  ?Skin: ?   General: Skin is warm.  ?Neurological:  ?   Mental Status: He is alert and oriented to person, place, and time.  ?Psychiatric:     ?   Mood and Affect: Mood normal. Mood is not anxious.     ?   Behavior:  Behavior normal. Behavior is not agitated.  ? ? ?Palliative Assessment/Data: 70% ? ? ? ? ?I discussed this patient's plan of care with patient, patient's son [name]. ? ?Thank you for this consult. Palliative medicine will continue to follow and assist holistically.  ? ?Time Total: 90 minutes ?Greater than 50%  of this time was spent counseling and coordinating care related to the above assessment and plan. ? ?Signed by: ?Jordan Hawks, DNP, FNP-BC ?Palliative Medicine ? ?  ?Please contact Palliative Medicine Team phone at [phone] for questions and concerns.  ?For individual provider: See Amion ? ? ? ? ? ? ? ? ? ? ? ? ?  ?

## 2021-04-26 NOTE — Plan of Care (Signed)
?  Problem: Activity: ?Goal: Capacity to carry out activities will improve ?Outcome: Progressing ?  ?

## 2021-04-26 NOTE — Progress Notes (Signed)
Subjective:  ?Patient feels poorly this morning ? ?Intake/Output from previous day: ? ?I/O last 3 completed shifts: ?In: 1369.9 [P.O.:1260; I.V.:109.9] ?Out: 0488 [Urine:1650] ?Total I/O ?In: 240 [P.O.:240] ?Out: 600 [Urine:600] ? ? ?Intake/Output Summary (Last 24 hours) at 04/26/2021 0635 ?Last data filed at 04/26/2021 0431 ?Gross per 24 hour  ?Intake 781 ml  ?Output 1100 ml  ?Net -319 ml  ? ? ?Net IO Since Admission: -14,182.9 mL [04/26/21 1742] ? ?Blood pressure 95/69, pulse 70, temperature (!) 97.4 ?F (36.3 ?C), temperature source Oral, resp. rate 18, height 5' 10" (1.778 m), weight 67.3 kg, SpO2 99 %. ? ?Filed Weights  ? 04/24/21 8916 04/25/21 1137 04/26/21 0425  ?Weight: 64.9 kg 64.9 kg 67.3 kg  ?  ? ?Physical Exam ?Neck:  ?   Vascular: No JVD.  ?Cardiovascular:  ?   Rate and Rhythm: Normal rate and regular rhythm.  ?   Pulses: Intact distal pulses.  ?   Heart sounds: Murmur heard.  ?Midsystolic murmur is present with a grade of 2/6 at the apex.  ?  Gallop present. S3 sounds present.  ?   Comments: Bilateral varicose veins in lower extremity noted ?Pulmonary:  ?   Effort: Pulmonary effort is normal.  ?   Breath sounds: Normal breath sounds.  ?Abdominal:  ?   General: Bowel sounds are normal.  ?   Palpations: Abdomen is soft.  ?Musculoskeletal:  ?   Right lower leg: No edema.  ?   Left lower leg: No edema.  ? ?Lab Results: ?BMP ?BNP (last 3 results) ?Recent Labs  ?  04/18/21 ?1428 04/20/21 ?1155 04/24/21 ?9450  ?BNP 1,882.3* 1,925.8* 1,094.6*  ? ? ? ?ProBNP (last 3 results) ?Recent Labs  ?  04/04/21 ?0814  ?PROBNP 17,519*  ? ? ? ?  Latest Ref Rng & Units 04/26/2021  ?  5:52 AM 04/25/2021  ?  3:27 AM 04/24/2021  ?  5:43 PM  ?BMP  ?Glucose 70 - 99 mg/dL 138   146   161    ?BUN 8 - 23 mg/dL 85   87   86    ?Creatinine 0.61 - 1.24 mg/dL 2.45   2.07   2.22    ?Sodium 135 - 145 mmol/L 132   134   132    ?Potassium 3.5 - 5.1 mmol/L 4.4   3.2   3.9    ?Chloride 98 - 111 mmol/L 92   94   92    ?CO2 22 - 32 mmol/L _0 ?Calcium 8.9 - 10.3 mg/dL 8.7   8.7   8.7    ? ? ?  Latest Ref Rng & Units 04/22/2021  ? 12:20 PM 04/20/2021  ? 11:55 AM 02/10/2020  ? 10:55 AM  ?Hepatic Function  ?Total Protein 6.5 - 8.1 g/dL 6.9   6.9   6.5    ?Albumin 3.5 - 5.0 g/dL 3.4   3.5   3.8    ?AST 15 - 41 U/L 36   40   19    ?ALT 0 - 44 U/L _1 ?Alk Phosphatase 38 - 126 U/L 125   131   64    ?Total Bilirubin 0.3 - 1.2 mg/dL 2.0   3.1   1.0    ?Bilirubin, Direct 0.0 - 0.2 mg/dL 0.7      ? ? ?  Latest Ref Rng & Units 04/25/2021  ?  3:27 AM 04/24/2021  ?  7:36 AM 04/20/2021  ? 11:55 AM  ?CBC  ?WBC 4.0 - 10.5 K/uL 8.7   9.6   7.7    ?Hemoglobin 13.0 - 17.0 g/dL 13.8   14.0   14.0    ?Hematocrit 39.0 - 52.0 % 40.8   42.8   42.2    ?Platelets 150 - 400 K/uL 183   176   142    ? ?Lipid Panel  ?   ?Component Value Date/Time  ? CHOL 179 03/20/2019 0804  ? TRIG 61 03/20/2019 0804  ? HDL 51 03/20/2019 0804  ? [place] 116 (H) 03/20/2019 0804  ? ?BNP (last 3 results) ?Recent Labs  ?  04/18/21 ?1428 04/20/21 ?1155 04/24/21 ?1610  ?BNP 1,882.3* 1,925.8* 1,094.6*  ? ? ? ?ProBNP (last 3 results) ?Recent Labs  ?  04/04/21 ?0814  ?PROBNP 17,519*  ? ? ? ?TSH ?Recent Labs  ?  12/06/20 ?1142  ?TSH 4.190  ? ? ? ?Imaging: ? ?Cardiac Studies: ? ?Echocardiogram 04/22/2021:  ?1. Left ventricular ejection fraction, by estimation, is <20%. The left ventricle has severely decreased function. The left ventricle demonstrates global hypokinesis. The left ventricular internal cavity size was dilated (LVIDd 7.4cm). Left ventricular  ?diastolic parameters are consistent with Grade III diastolic dysfunction (restrictive). Elevated left atrial pressure. The E/e' is 19.3. ? 2. Right ventricular systolic function is mildly reduced. The right ventricular size is normal. There is moderately elevated pulmonary artery systolic pressure. The estimated right ventricular systolic pressure is 96.0 mmHg. ? 3. Left atrial size was severely dilated. ? 4. Right atrial size was mild to  moderately dilated. ? 5. Moderate pericardial effusion. The pericardial effusion is circumferential, mostly posterior. ? 6. The mitral valve is grossly normal. Severe mitral valve regurgitation. No evidence of mitral stenosis. ? 7. Tricuspid valve regurgitation is moderate to severe. ? 8. The aortic valve is tricuspid. Aortic valve regurgitation is mild. Aortic valve sclerosis is present, with no evidence of aortic valve stenosis. ? ?Comparison(s): No significant change from prior study. ? ?Conclusion(s)/Recommendation(s): Findings consistent with dilated cardiomyopathy. No left ventricular mural or apical thrombus/thrombi. ? ?EKG: Underlying A. Fib with BV paced rhythm. ? ?Telemetry: 04/26/21 APBP rhythm.  ? ? ?Scheduled Meds: ? amiodarone  200 mg Oral BID  ? Followed by  ? [START ON 05/01/2021] amiodarone  200 mg Oral Daily  ? apixaban  5 mg Oral BID  ? Chlorhexidine Gluconate Cloth  6 each Topical Daily  ? pantoprazole  40 mg Oral QHS  ? polyethylene glycol  17 g Oral BID  ? senna-docusate  1 tablet Oral QHS  ? sodium chloride flush  10-40 mL Intracatheter Q12H  ? torsemide  20 mg Oral BID  ? ?Continuous Infusions: ? DOBUTamine 1 mcg/kg/min (04/25/21 1334)  ? ?PRN Meds:.sodium chloride flush ? ?Assessment/Plan:  ?1.  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  Persistent atrial fibrillation SP direct-current cardioversion, maintaining sinus rhythm/a paced rhythm on telemetry. ?3.  Acute on chronic systolic heart failure with severe LV systolic dysfunction ?4.  Chronic renal insufficiency stage IIIb-IV. ? ?Recommendation:  ?Patient appears to be heading back to CHF with worsening creatinine  and CoOx worse at 41.5 this morning. I had weaned Dotutrex to 1 mcg, but suspect I have to go back to 2.5 mcg and potential consideration of going home on this with hospice/palliative care as there are no other medical options. He is unable to tolerate any guideline directed medical therapy.   ? ?  Although volume status is now normal and does not need any further diuresis, he has no COP to sustain.  I will decrease torsemide dosing to 20 mg twice daily.  So far it appears like he is not negative 14 Litres. Unfortunately this is probably end of life and I discussed this with the patient.  ? ? ? ?Adrian Prows, MD, [facility] ?04/26/2021, 6:35 AM ?Office: [phone] ?Fax: [phone] ?Pager: [phone]  ?

## 2021-04-26 NOTE — Plan of Care (Signed)
?  Problem: Activity: ?Goal: Capacity to carry out activities will improve ?Outcome: Progressing ?  ?Problem: Cardiac: ?Goal: Ability to achieve and maintain adequate cardiopulmonary perfusion will improve ?Outcome: Progressing ?  ?Problem: Activity: ?Goal: Capacity to carry out activities will improve ?Outcome: Progressing ?  ?

## 2021-04-26 NOTE — Progress Notes (Signed)
? ?Subjective: ?Miguel Patterson is a 75 y.o. male with history of non-compaction cardiomyopathy, severe valvular disease, chronic kidney disease, and atrial fibrillation, currently hospital day #6 who was admitted for cardiogenic shock. Patient's dobutamine was titrated down to 1 yesterday. Overnight patient had one episode of shortness of breath with orthopnea however vitals and O2 sat were stable. On rounds this morning, patient states he is feeling more short of breath despite initially feeling improved after his cardioversion yesterday. He denies any chest pain, dizziness, or lightheadedness.  ? ?Objective: ? ?Vital signs in last 24 hours: ?Vitals:  ? 04/25/21 1900 04/26/21 0425 04/26/21 0700 04/26/21 0722  ?BP: 90/65 95/69 92/69 91/66  ?Pulse: 72 70 72 71  ?Resp: 19 18 14 18  ?Temp: 97.6 ?F (36.4 ?C) (!) 97.4 ?F (36.3 ?C)    ?TempSrc: Oral Oral    ?SpO2: 98% 99% 100% 100%  ?Weight:  67.3 kg    ?Height:      ? ?Physical Exam ?HENT:  ?   Head: Normocephalic.  ?Cardiovascular:  ?   Rate and Rhythm: Normal rate and regular rhythm.  ?Pulmonary:  ?   Comments: Increased work of breathing ?Skin: ?   Comments: Lower extremities are cool to touch  ?Neurological:  ?   Mental Status: He is alert and oriented to person, place, and time.  ?  ?Weight change:  ? ?Intake/Output Summary (Last 24 hours) at 04/26/2021 1047 ?Last data filed at 04/26/2021 0431 ?Gross per 24 hour  ?Intake 781 ml  ?Output 1100 ml  ?Net -319 ml  ? ? ?  Latest Ref Rng & Units 04/23/2021  ?  4:10 AM 04/23/2021  ?  6:40 PM 04/24/2021  ?  5:17 AM 04/25/2021  ?  3:27 AM 04/26/2021  ?  5:52 AM  ?Total Hgb  ?Total Hgb 12.0 - 16.0 g/dL 14.5   15.1   14.6   14.0   14.6    ? ? ?  Latest Ref Rng & Units 04/23/2021  ?  4:10 AM 04/23/2021  ?  6:40 PM 04/24/2021  ?  5:17 AM 04/25/2021  ?  3:27 AM 04/26/2021  ?  5:52 AM  ?O2 Sat  ?O2 Sat % 58.9   51   60.2   61.1   41.5    ? ? ?  Latest Ref Rng & Units 04/23/2021  ?  4:10 AM 04/23/2021  ?  6:40 PM 04/24/2021  ?  5:17 AM  04/25/2021  ?  3:27 AM 04/26/2021  ?  5:52 AM  ?Carboxy Hgb  ?Carboxy Hgb 0.5 - 1.5 % 1.6   1.2   1.9   2.0   1.1    ? ? ?  Latest Ref Rng & Units 04/23/2021  ?  4:10 AM 04/23/2021  ?  6:40 PM 04/24/2021  ?  5:17 AM 04/25/2021  ?  3:27 AM 04/26/2021  ?  5:52 AM  ?Met Hgb  ?Met Hgb 0.0 - 1.5 % <0.7   2.0   1.5   <0.7   <0.7    ? ? ?  04/25/2021  ?  2:13 AM 04/25/2021  ?  1:47 PM 04/26/2021  ?  2:13 AM 04/26/2021  ?  7:00 AM 04/26/2021  ?  7:22 AM  ?CVP  ?CVP (mmHg) 5 mmHg 2 mmHg 16 mmHg 15 mmHg 4 mmHg  ? ? ?CBC ?   ?Component Value Date/Time  ? WBC 8.7 04/25/2021 0327  ? RBC 4.63 04/25/2021 0327  ?   HGB 13.8 04/25/2021 0327  ? HGB 13.2 12/06/2020 1142  ? HCT 40.8 04/25/2021 0327  ? HCT 39.7 12/06/2020 1142  ? PLT 183 04/25/2021 0327  ? PLT 150 12/06/2020 1142  ? MCV 88.1 04/25/2021 0327  ? MCV 87 12/06/2020 1142  ? MCH 29.8 04/25/2021 0327  ? MCHC 33.8 04/25/2021 0327  ? RDW 18.9 (H) 04/25/2021 0327  ? RDW 13.3 12/06/2020 1142  ? LYMPHSABS 0.8 04/20/2021 1155  ? MONOABS 0.5 04/20/2021 1155  ? EOSABS 0.0 04/20/2021 1155  ? BASOSABS 0.1 04/20/2021 1155  ? ? ?Assessment/Plan: ? ?Principal Problem: ?  Cardiogenic shock ([place]) ?Active Problems: ?  Paroxysmal atrial fibrillation ([place]) ?  Acute on chronic HFrEF (heart failure with reduced ejection fraction) ([place]) ?  Acute renal failure superimposed on stage 3a chronic kidney disease ([place]) ? ? ?Cardiogenic shock  ?Noncompaction cardiomyopathy ?Valvular disease ?Acute on chronic HFrEF ?-Patient's dobutamine was titrated down to 1 yesterday however patient is unable to tolerate this with increased shortness of breath and co-ox O2 down to 41.5% from 61.1% yesterday. Fick's score cardiac index 1.0.  ?-On physical exam, patient's lower extremities were cool to touch.  ?-Discussed with Dr. Einar Gip on the cardiology team and patient is now back up to 2.5 of dobutamine. ?-Palliative care team was consulted and has met with the patient. It was determined that discharging on dobutamine with home  health may be appropriate. Dr. Einar Gip will help arrange for patient's home dobutamine.  ?-Will continue torsemide, now down to 35m BID. Patient was euvolemic on exam.  ? ?Acute on chronic kidney disease stage 3b secondary to cardiorenal syndrome.  ?-Cr 2.45 up from 2.07 yesterday. BUN 85. Acute kidney injury likely due to decrease in dobutamine dose.  ?-Will continue to monitor kidney function closely with increase in his dobutamine dose today.  ?-K+ stable at 4.4 today.  ? ?Atrial fibrillation ?-Patient underwent cardioversion yesterday.  ?-He remains on amiodarone as well as Eliquis for anticoagulation therapy.  ?-Pending ECG s/p cardioversion.  ? ? LOS: 6 days  ? ?YClydell Hakim Medical Student ?04/26/2021, 10:47 AM  ? ?Pager: [phone]?

## 2021-04-26 NOTE — Evaluation (Signed)
Occupational Therapy Evaluation ?Patient Details ?Name: Miguel Patterson ?MRN: [id number] ?DOB: 06/24/1945 ?Today's Date: 04/26/2021 ? ? ?History of Present Illness 76 yo male admitted 4/20 from cardiologist office due to sudden SOB. Found to have acute on chronic CHF and CKD, dilated cardiomyopathy. Pt underwent cardioversion 4/11, back in afib at office visit on 4/18. LE cellulitis and volume overload being treated prior to admission. PLan for cardioversion 4/25. PMH includes: CHF, CKD IV, AICD, LBBB, ICD, severe MR.  ? ?Clinical Impression ?  ?PT admitted with CHF and SOB. Pt currently with functional limitiations due to the deficits listed below (see OT problem list). Pt at baseline MOD I and completes all adls. Pt has son helping with IADLS since September 8185.  Pt will benefit from skilled OT to increase their independence and safety with adls and balance to allow discharge home. ?  ?   ? ?Recommendations for follow up therapy are one component of a multi-disciplinary discharge planning process, led by the attending physician.  Recommendations may be updated based on patient status, additional functional criteria and insurance authorization.  ? ?Follow Up Recommendations ? No OT follow up  ?  ?Assistance Recommended at Discharge None  ?Patient can return home with the following   ? ?  ?Functional Status Assessment ? Patient has had a recent decline in their functional status and demonstrates the ability to make significant improvements in function in a reasonable and predictable amount of time.  ?Equipment Recommendations ? Tub/shower seat  ?  ?Recommendations for Other Services   ? ? ?  ?Precautions / Restrictions Precautions ?Precautions: None  ? ?  ? ?Mobility Bed Mobility ?Overal bed mobility: Modified Independent ?  ?  ?  ?  ?  ?  ?  ?  ? ?Transfers ?Overall transfer level: Needs assistance ?  ?Transfers: Sit to/from Stand ?Sit to Stand: Supervision ?  ?  ?  ?  ?  ?  ?  ? ?  ?Balance Overall balance  assessment: Needs assistance ?  ?Sitting balance-Leahy Scale: Normal ?  ?  ?Standing balance support: During functional activity, No upper extremity supported ?Standing balance-Leahy Scale: Good ?  ?  ?  ?  ?  ?  ?  ?  ?  ?  ?  ?  ?   ? ?ADL either performed or assessed with clinical judgement  ? ?ADL Overall ADL's : Needs assistance/impaired ?Eating/Feeding: Independent ?  ?Grooming: Wash/dry hands;Wash/dry face;Oral care;Modified independent;Standing ?  ?Upper Body Bathing: Supervision/ safety;Standing ?  ?Lower Body Bathing: Supervison/ safety;Sit to/from stand ?  ?Upper Body Dressing : Supervision/safety;Sitting ?  ?Lower Body Dressing: Supervision/safety;Sit to/from stand ?  ?Toilet Transfer: Supervision/safety;Ambulation ?  ?  ?  ?  ?  ?Functional mobility during ADLs: Supervision/safety ?General ADL Comments: pt c/o hair feeling dirty. pt and OT washing hair at sink this session. pt reports "i am so good now"  ? ? ? ?Vision Baseline Vision/History: 1 Wears glasses ?Ability to See in Adequate Light: 0 Adequate ?   ?   ?Perception   ?  ?Praxis   ?  ? ?Pertinent Vitals/Pain Pain Assessment ?Pain Assessment: No/denies pain  ? ? ? ?Hand Dominance Right ?  ?Extremity/Trunk Assessment Upper Extremity Assessment ?Upper Extremity Assessment: Overall WFL for tasks assessed ?  ?Lower Extremity Assessment ?Lower Extremity Assessment: Defer to PT evaluation ?  ?Cervical / Trunk Assessment ?Cervical / Trunk Assessment: Normal ?  ?Communication Communication ?Communication: No difficulties ?  ?Cognition Arousal/Alertness: Awake/alert ?Behavior During  Therapy: WFL for tasks assessed/performed ?Overall Cognitive Status: Within Functional Limits for tasks assessed ?  ?  ?  ?  ?  ?  ?  ?  ?  ?  ?  ?  ?  ?  ?  ?  ?  ?  ?  ?General Comments  VSS RA ? ?  ?Exercises   ?  ?Shoulder Instructions    ? ? ?Home Living Family/patient expects to be discharged to:: Private residence ?Living Arrangements: Alone ?Available Help at Discharge:  Family;Friend(s);Available PRN/intermittently ?Type of Home: House ?Home Access: Stairs to enter ?Entrance Stairs-Number of Steps: 1 ?  ?Home Layout: Multi-level;Able to live on main level with bedroom/bathroom ?Alternate Level Stairs-Number of Steps: 13 ?Alternate Level Stairs-Rails: Right;Left ?Bathroom Shower/Tub: Walk-in shower ?  ?Bathroom Toilet: Standard ?  ?  ?Home Equipment: None ?  ?Additional Comments: son works during the week and will (A) on the weekends. son already mowing grass weekly since sept. ?  ? ?  ?Prior Functioning/Environment Prior Level of Function : Independent/Modified Independent ?  ?  ?  ?  ?  ?  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 reports no falls, no use of DME. was running and doing core workouts each day until sep 2023 ?ADLs Comments: reports fully independent ?  ? ?  ?  ?OT Problem List: Decreased strength;Impaired balance (sitting and/or standing);Decreased activity tolerance ?  ?   ?OT Treatment/Interventions: Self-care/ADL training;Energy conservation;DME and/or AE instruction;Therapeutic activities;Balance training  ?  ?OT Goals(Current goals can be found in the care plan section) Acute Rehab OT Goals ?Patient Stated Goal: to wash hair ?OT Goal Formulation: With patient ?Time For Goal Achievement: 05/10/21 ?Potential to Achieve Goals: Good  ?OT Frequency: Min 2X/week ?  ? ?Co-evaluation   ?  ?  ?  ?  ? ?  ?AM-PAC OT "6 Clicks" Daily Activity     ?Outcome Measure Help from another person eating meals?: None ?Help from another person taking care of personal grooming?: None ?Help from another person toileting, which includes using toliet, bedpan, or urinal?: None ?Help from another person bathing (including washing, rinsing, drying)?: None ?Help from another person to put on and taking off regular upper body clothing?: None ?Help from another person to put on and taking  off regular lower body clothing?: None ?6 Click Score: 24 ?  ?End of Session Nurse Communication: Mobility status;Precautions ? ?Activity Tolerance: Patient tolerated treatment well ?Patient left: in chair;with call bell/phone within reach;with chair alarm set ? ?OT Visit Diagnosis: Unsteadiness on feet (R26.81);Muscle weakness (generalized) (M62.81)  ?              ?Time: 5366-4403 ?OT Time Calculation (min): 28 min ?Charges:  OT General Charges ?$OT Visit: 1 Visit ?OT Evaluation ?$OT Eval Moderate Complexity: 1 Mod ?OT Treatments ?$Self Care/Home Management : 8-22 mins ? ? ?Brynn, OTR/L  ?Acute Rehabilitation Services ?Office: [phone] ?. ? ?Jeri Modena ?04/26/2021, 11:37 AM ?

## 2021-04-27 ENCOUNTER — Ambulatory Visit: Payer: Medicare Other | Admitting: Cardiology

## 2021-04-27 LAB — COOXEMETRY PANEL
Carboxyhemoglobin: 1.5 % (ref 0.5–1.5)
Carboxyhemoglobin: 0.6 % (ref 0.5–1.5)
Methemoglobin: <0.7 % (ref 0.0–1.5)
Methemoglobin: 0.7 % (ref 0.0–1.5)
O2 Saturation: 55.9 %
O2 Saturation: 37.3 %
Total hemoglobin: 13.7 g/dL (ref 12.0–16.0)
Total hemoglobin: 14.6 g/dL (ref 12.0–16.0)

## 2021-04-27 LAB — PHOSPHORUS: Phosphorus: 3.7 mg/dL (ref 2.5–4.6)

## 2021-04-27 LAB — BASIC METABOLIC PANEL
Anion gap: 12 (ref 5–15)
BUN: 82 mg/dL — ABNORMAL HIGH (ref 8–23)
CO2: 28 mmol/L (ref 22–32)
Calcium: 8.7 mg/dL — ABNORMAL LOW (ref 8.9–10.3)
Chloride: 93 mmol/L — ABNORMAL LOW (ref 98–111)
Creatinine, Ser: 1.97 mg/dL — ABNORMAL HIGH (ref 0.61–1.24)
GFR, Estimated: 35 mL/min — ABNORMAL LOW (ref >60)
Glucose, Bld: 113 mg/dL — ABNORMAL HIGH (ref 70–99)
Potassium: 3.5 mmol/L (ref 3.5–5.1)
Sodium: 133 mmol/L — ABNORMAL LOW (ref 135–145)

## 2021-04-27 LAB — MAGNESIUM: Magnesium: 2.4 mg/dL (ref 1.7–2.4)

## 2021-04-27 MED ORDER — DOBUTAMINE IN D5W 4-5 MG/ML-% IV SOLN
2.5000 ug/kg/min | INTRAVENOUS | 12 refills | Status: DC
Start: 2021-04-27 — End: 2021-05-16

## 2021-04-27 NOTE — Plan of Care (Signed)
?  Problem: Education: ?Goal: Ability to demonstrate management of disease process will improve ?Outcome: Progressing ?Goal: Ability to verbalize understanding of medication therapies will improve ?Outcome: Progressing ?  ?Problem: Education: ?Goal: Ability to demonstrate management of disease process will improve ?Outcome: Progressing ?  ?

## 2021-04-27 NOTE — Progress Notes (Signed)
? ?Subjective: ?Miguel Patterson is a 76 y.o. M with PMH significant for noncompaction cardiomyopathy, HFrEF, valvular disease and atrial fibrillation, currently hospital day #7 who was admitted for cardiogenic shock. Patient is feeling more improved with his dobutamine increased back up to 2.5. He denies any shortness of breath however does note some weakness in his legs which he attributes to deconditioning.  ? ?Objective: ? ?Vital signs in last 24 hours: ?Vitals:  ? 04/27/21 0101 04/27/21 0545 04/27/21 0730 04/27/21 0745  ?BP: 106/65 107/65 (!) 96/54   ?Pulse: 70 76 70 79  ?Resp: 20 16 17 18   ?Temp: 97.6 ?F (36.4 ?C) (!) 97.5 ?F (36.4 ?C)    ?TempSrc: Oral Oral Oral   ?SpO2: 96% 99% 100% 99%  ?Weight:  65 kg    ?Height:      ? ?Weight change: 0.1 kg ? ?Intake/Output Summary (Last 24 hours) at 04/27/2021 1518 ?Last data filed at 04/27/2021 1035 ?Gross per 24 hour  ?Intake 713.07 ml  ?Output 2075 ml  ?Net -1361.93 ml  ? ? ?  Latest Ref Rng & Units 04/23/2021  ?  6:40 PM 04/24/2021  ?  5:17 AM 04/25/2021  ?  3:27 AM 04/26/2021  ?  5:52 AM 04/27/2021  ?  6:10 AM  ?Total Hgb  ?Total Hgb 12.0 - 16.0 g/dL 15.1   14.6   14.0   14.6   13.7    ? ? ?  Latest Ref Rng & Units 04/23/2021  ?  6:40 PM 04/24/2021  ?  5:17 AM 04/25/2021  ?  3:27 AM 04/26/2021  ?  5:52 AM 04/27/2021  ?  6:10 AM  ?O2 Sat  ?O2 Sat % 51   60.2   61.1   41.5   55.9    ? ? ?  Latest Ref Rng & Units 04/23/2021  ?  6:40 PM 04/24/2021  ?  5:17 AM 04/25/2021  ?  3:27 AM 04/26/2021  ?  5:52 AM 04/27/2021  ?  6:10 AM  ?Carboxy Hgb  ?Carboxy Hgb 0.5 - 1.5 % 1.2   1.9   2.0   1.1   1.5    ? ? ?  Latest Ref Rng & Units 04/23/2021  ?  6:40 PM 04/24/2021  ?  5:17 AM 04/25/2021  ?  3:27 AM 04/26/2021  ?  5:52 AM 04/27/2021  ?  6:10 AM  ?Met Hgb  ?Met Hgb 0.0 - 1.5 % 2.0   1.5   <0.7   <0.7   <0.7    ? ? ?  04/26/2021  ?  7:22 AM 04/26/2021  ?  8:00 AM 04/27/2021  ?  1:58 AM 04/27/2021  ?  7:45 AM 04/27/2021  ? 10:13 AM  ?CVP  ?CVP (mmHg) 4 mmHg 11 mmHg 13 mmHg 11 mmHg 11 mmHg  ?  ? ?Physical  Exam ?HENT:  ?   Head: Normocephalic.  ?Neck:  ?   Vascular: No JVD.  ?Cardiovascular:  ?   Rate and Rhythm: Normal rate and regular rhythm.  ?Pulmonary:  ?   Effort: Pulmonary effort is normal.  ?   Breath sounds: Normal breath sounds. No decreased breath sounds, wheezing or rhonchi.  ?Skin: ?   General: Skin is dry.  ?   Comments: Cool to touch  ?Neurological:  ?   Mental Status: He is alert.  ?  ?Assessment/Plan: ? ?Principal Problem: ?  Cardiogenic shock ([place]) ?Active Problems: ?  Paroxysmal atrial fibrillation ([place]) ?  Acute on  chronic HFrEF (heart failure with reduced ejection fraction) ([place]) ?  Acute renal failure superimposed on stage 3a chronic kidney disease ([place]) ? ?Patient is a 76 y.o. M with PMH significant for noncompaction cardiomyopathy, valvular disease, chronic kidney disease, and atrial fibrillation, admitted for cardiogenic shock.  ? ?Cardiogenic shock ?-Patietn is feeling much improved with his dobutamine abck up to 2.5.  ?-Co-ox O2 improved to 55.9%, up from 41.5%. Fick score cardiac index at 1.3, up from 1.0.  ?-Dr. Einar Gip, his cardiologist, is currently arranging home health for dobutamine therapy. Patient has also been meeting with the palliative care team and discussing goal of care.  ?-He continues to have good diuresis on toresemide. Will continue 20 mg BID.  ?-Pending discharge while awaiting his home health care arrangements.  ? ?Acute on chronic kidney disease stage 3b secondary to cardiorenal syndrome.  ?-Cr improved to 1.97 from 2.45. BUN 82.  ?-Will continue monitoring for improvements in kidney function on dobutamine therapy.  ? ?Atrial fibrillation ?-Patient is now s/p cardioversion. Last ECG shows he is in AV dual-paced rhythm.  ?-He remains on amiodarone as well as Eliquis for anticoagulation therapy.  ? ? LOS: 7 days  ? ?Miguel Patterson, Medical Student ?04/27/2021, 3:18 PM ? ?Pager number: [phone] ? ?

## 2021-04-27 NOTE — TOC Progression Note (Addendum)
Transition of Care (TOC) - Progression Note  ? ? ?Patient Details  ?Name: Miguel Patterson ?MRN: [id number] ?Date of Birth: 22-May-1945 ? ?Transition of Care (TOC) CM/SW Contact  ?Miguel Mayo, RN ?Phone Number: ?04/27/2021, 4:12 PM ? ?Clinical Narrative:    ?Amy with [facility] Patterson  states they can service this referral for dobutamine infusion along with [facility].  Patient is set up with [facility] for [facility],  and with [facility] Miguel Patterson) who will supply the dobutamine and the education on IV infusion with patient and a family member.   [facility] will deliver the hospital bed.  Daughter is the contact person to coordinate the delivery of the hospital bed.  Per Miguel Patterson with [facility] looks like the hospital bed is scheduled to be delivered tomorrow, but she does not have a particular time. His daughter n law will coordinate the hospital bed and his daughter Miguel Patterson [phone] will be here tomorrow at 7 am and can be here all day if needed. NCM asked MD for OPAT orders so Pam can order the dobutamine .   ? ? ?Expected Discharge Plan: [place] ?Barriers to Discharge: Continued Medical Work up ? ?Expected Discharge Plan and Services ?Expected Discharge Plan: [place] ?  ?Discharge Planning Services: CM Consult ?Post Acute Care Choice: Home Health ?Living arrangements for the past 2 months: [place] ?                ?DME Arranged: Hospital bed ?DME Agency: [facility] ?Date DME Agency Contacted: 04/27/21 ?Time DME Agency Contacted: 7622 ?Representative spoke with at DME Agency: Miguel Patterson ?HH Arranged: RN (IV Dobutamine infusion) ?[place] Agency: [facility] ?Date HH Agency Contacted: 04/27/21 ?Time [place]: 6333 ?Representative spoke with at [place]: Amy/Pam ? ? ?Social Determinants of Health (SDOH) Interventions ?  ? ?Readmission Risk Interventions ?   ? View : No data to display.  ?  ?  ?  ? ? ?

## 2021-04-27 NOTE — Progress Notes (Signed)
This chaplain responded to PMT consult creating/updating the Pt. Advance Directive. The first attempt was postponed by the Pt. for lunch and the second attempt family is visiting the Pt.  The chaplain plans to revisit on Friday morning. ? ?Chaplain Sallyanne Kuster ?[phone] ?

## 2021-04-27 NOTE — Progress Notes (Signed)
Subjective:  ?No specific complaints. No significant issues overnight ? ?Intake/Output from previous day: ? ?I/O last 3 completed shifts: ?In: 1140.1 [P.O.:1040; I.V.:100.1] ?Out: 2925 [Urine:2925] ?Total I/O ?In: 373 [P.O.:360; I.V.:13] ?Out: 450 [Urine:450] ? ? ?Intake/Output Summary (Last 24 hours) at 04/27/2021 1247 ?Last data filed at 04/27/2021 1035 ?Gross per 24 hour  ?Intake 1073.07 ml  ?Output 2425 ml  ?Net -1351.93 ml  ? ? ?Net IO Since Admission: -15,544.83 mL [04/27/21 1247] ? ?Blood pressure (!) 96/54, pulse 79, temperature (!) 97.5 ?F (36.4 ?C), temperature source Oral, resp. rate 18, height _0  (1.778 m), weight 65 kg, SpO2 99 %. ? ?Filed Weights  ? 04/25/21 1137 04/26/21 0425 04/27/21 0545  ?Weight: 64.9 kg 67.3 kg 65 kg  ?  ? ?Physical Exam ?Neck:  ?   Vascular: No JVD.  ?Cardiovascular:  ?   Rate and Rhythm: Normal rate and regular rhythm.  ?   Pulses: Intact distal pulses.  ?   Heart sounds: Murmur heard.  ?Midsystolic murmur is present with a grade of 2/6 at the apex.  ?  Gallop present. S3 sounds present.  ?   Comments: Bilateral varicose veins in lower extremity noted ?Pulmonary:  ?   Effort: Pulmonary effort is normal.  ?   Breath sounds: Normal breath sounds.  ?Abdominal:  ?   General: Bowel sounds are normal.  ?   Palpations: Abdomen is soft.  ?Musculoskeletal:  ?   Right lower leg: No edema.  ?   Left lower leg: No edema.  ? ?Lab Results: ?BMP ?BNP (last 3 results) ?Recent Labs  ?  04/18/21 ?1428 04/20/21 ?1155 04/24/21 ?5790  ?BNP 1,882.3* 1,925.8* 1,094.6*  ? ? ? ?ProBNP (last 3 results) ?Recent Labs  ?  04/04/21 ?0814  ?PROBNP 17,519*  ? ? ?Lab Results  ?Component Value Date  ? NA 133 (L) 04/27/2021  ? K 3.5 04/27/2021  ? CO2 28 04/27/2021  ? GLUCOSE 113 (H) 04/27/2021  ? BUN 82 (H) 04/27/2021  ? CREATININE 1.97 (H) 04/27/2021  ? CALCIUM 8.7 (L) 04/27/2021  ? EGFR 40 (L) 12/02/2020  ? GFRNONAA 35 (L) 04/27/2021  ? ? ? ?  Latest Ref Rng & Units 04/27/2021  ?  6:20 AM 04/26/2021  ?  5:52 AM  04/25/2021  ?  3:27 AM  ?BMP  ?Glucose 70 - 99 mg/dL 113   138   146    ?BUN 8 - 23 mg/dL 82   85   87    ?Creatinine 0.61 - 1.24 mg/dL 1.97   2.45   2.07    ?Sodium 135 - 145 mmol/L 133   132   134    ?Potassium 3.5 - 5.1 mmol/L 3.5   4.4   3.2    ?Chloride 98 - 111 mmol/L 93   92   94    ?CO2 22 - 32 mmol/L _1 ?Calcium 8.9 - 10.3 mg/dL 8.7   8.7   8.7    ? ? ?  Latest Ref Rng & Units 04/22/2021  ? 12:20 PM 04/20/2021  ? 11:55 AM 02/10/2020  ? 10:55 AM  ?Hepatic Function  ?Total Protein 6.5 - 8.1 g/dL 6.9   6.9   6.5    ?Albumin 3.5 - 5.0 g/dL 3.4   3.5   3.8    ?AST 15 - 41 U/L 36   40   19    ?ALT 0 - 44 U/L 27  27   16    ?Alk Phosphatase 38 - 126 U/L 125   131   64    ?Total Bilirubin 0.3 - 1.2 mg/dL 2.0   3.1   1.0    ?Bilirubin, Direct 0.0 - 0.2 mg/dL 0.7      ? ? ?  Latest Ref Rng & Units 04/25/2021  ?  3:27 AM 04/24/2021  ?  7:36 AM 04/20/2021  ? 11:55 AM  ?CBC  ?WBC 4.0 - 10.5 K/uL 8.7   9.6   7.7    ?Hemoglobin 13.0 - 17.0 g/dL 13.8   14.0   14.0    ?Hematocrit 39.0 - 52.0 % 40.8   42.8   42.2    ?Platelets 150 - 400 K/uL 183   176   142    ? ?Lipid Panel  ?   ?Component Value Date/Time  ? CHOL 179 03/20/2019 0804  ? TRIG 61 03/20/2019 0804  ? HDL 51 03/20/2019 0804  ? [place] 116 (H) 03/20/2019 0804  ? ?BNP (last 3 results) ?Recent Labs  ?  04/18/21 ?1428 04/20/21 ?1155 04/24/21 ?6945  ?BNP 1,882.3* 1,925.8* 1,094.6*  ? ? ? ?ProBNP (last 3 results) ?Recent Labs  ?  04/04/21 ?0814  ?PROBNP 17,519*  ? ? ? ?TSH ?Recent Labs  ?  12/06/20 ?1142  ?TSH 4.190  ? ? ?Cardiac Studies: ? ?Echocardiogram 04/22/2021:  ?1. Left ventricular ejection fraction, by estimation, is <20%. The left ventricle has severely decreased function. The left ventricle demonstrates global hypokinesis. The left ventricular internal cavity size was dilated (LVIDd 7.4cm). Left ventricular  ?diastolic parameters are consistent with Grade III diastolic dysfunction (restrictive). Elevated left atrial pressure. The E/e' is 19.3. ? 2.  Right ventricular systolic function is mildly reduced. The right ventricular size is normal. There is moderately elevated pulmonary artery systolic pressure. The estimated right ventricular systolic pressure is 03.8 mmHg. ? 3. Left atrial size was severely dilated. ? 4. Right atrial size was mild to moderately dilated. ? 5. Moderate pericardial effusion. The pericardial effusion is circumferential, mostly posterior. ? 6. The mitral valve is grossly normal. Severe mitral valve regurgitation. No evidence of mitral stenosis. ? 7. Tricuspid valve regurgitation is moderate to severe. ? 8. The aortic valve is tricuspid. Aortic valve regurgitation is mild. Aortic valve sclerosis is present, with no evidence of aortic valve stenosis. ? ?Comparison(s): No significant change from prior study. ? ?Conclusion(s)/Recommendation(s): Findings consistent with dilated cardiomyopathy. No left ventricular mural or apical thrombus/thrombi. ? ?EKG: Underlying A. Fib with BV paced rhythm. ? ?Telemetry: 04/26/21 APBP rhythm.  ? ? ?Scheduled Meds: ? amiodarone  200 mg Oral BID  ? Followed by  ? [START ON 05/01/2021] amiodarone  200 mg Oral Daily  ? apixaban  5 mg Oral BID  ? Chlorhexidine Gluconate Cloth  6 each Topical Daily  ? pantoprazole  40 mg Oral QHS  ? polyethylene glycol  17 g Oral BID  ? senna-docusate  1 tablet Oral QHS  ? sodium chloride flush  10-40 mL Intracatheter Q12H  ? torsemide  20 mg Oral BID  ? ?Continuous Infusions: ? DOBUTamine 2.5 mcg/kg/min (04/26/21 1005)  ? ?PRN Meds:.sodium chloride flush ? ?Assessment/Plan:  ?1.  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  Acute on chronic systolic heart failure with severe LV systolic dysfunction ?3.  Persistent atrial fibrillation SP direct-current cardioversion, maintaining sinus rhythm/a paced rhythm on telemetry. ?4.  Chronic renal insufficiency stage IIIb. ? ?Recommendation:  ? ?Patient feels remarkably well  today. BP still soft.  CoOx improved on  Dobut. I have spoken with George Ina, RN with [facility] (Home care) also trying to figure out who can do OP Dobutamine infusion.  ? ?CM looking into discharge needs.  ? ?Stable rhythm on Telemetry. ? ?Renal function better and stable today and back on Dobutamine 2.5 mcg. ? ?Continue Torsemide home dose and Discharge orders have been reconciled. OP f/u set up as well. Spoke to Davic his son.  ? ? ?Adrian Prows, MD, [facility] ?04/27/2021, 12:47 PM ?Office: [phone] ?Fax: [phone] ?Pager: [phone]  ?

## 2021-04-27 NOTE — TOC Progression Note (Addendum)
Transition of Care (TOC) - Progression Note  ? ? ?Patient Details  ?Name: Miguel Patterson ?MRN: [id number] ?Date of Birth: 1945-07-18 ? ?Transition of Care (TOC) CM/SW Contact  ?Zenon Mayo, RN ?Phone Number: ?04/27/2021, 11:02 AM ? ?Clinical Narrative:    ?NCM spoke with patient at the bedside, Palliative was in there speaking with him, so just offered choice to him for [facility] for dobutamine drip at home. He is ok with what ever agency can do the dobutamine drip for him.  Carolynn Sayers is also following.  NCM made referral to [facility] with [facility] , awaiting to hear back to see is she can take referral. Ramond Marrow states they can not take referral. NCM checked with [facility], they do not do dobutamine , NCM checked with [facility], they don'd do dobutamine,  checked with [facility], they are chiecking to see if can do dobutamine.   ? ? ?  ?  ? ?Expected Discharge Plan and Services ?  ?  ?  ?  ?  ?                ?  ?  ?  ?  ?  ?  ?  ?  ?  ?  ? ? ?Social Determinants of Health (SDOH) Interventions ?  ? ?Readmission Risk Interventions ?   ? View : No data to display.  ?  ?  ?  ? ? ?

## 2021-04-27 NOTE — Progress Notes (Signed)
Physical Therapy Treatment ?Patient Details ?Name: Miguel Patterson ?MRN: [id number] ?DOB: [birth year] ?Today's Date: 04/27/2021 ? ? ?History of Present Illness 76 yo male admitted 4/20 from cardiologist office due to sudden SOB. Found to have acute on chronic CHF and CKD, dilated cardiomyopathy. Pt underwent cardioversion 4/11, back in afib at office visit on 4/18. LE cellulitis and volume overload being treated prior to admission. PLan for cardioversion 4/25. PMH includes: CHF, CKD IV, AICD, LBBB, ICD, severe MR. ? ?  ?PT Comments  ? ? The pt was eager to participate in PT session focused on endurance and stability this afternoon. He was able to complete 600 ft with VSS on RA and improved gait speed from prior session. He reports no SOB with activity. He did initially need single UE support for balance, but progressed with continued mobility to good stability without UE support. Will continue to benefit from skilled PT acutely to progress endurance and independence with mobility, anticipate he will be safe to return home with intermittent assist and supervision from family.  ?   ?Recommendations for follow up therapy are one component of a multi-disciplinary discharge planning process, led by the attending physician.  Recommendations may be updated based on patient status, additional functional criteria and insurance authorization. ? ?Follow Up Recommendations ? Other (comment) (cardiopulmonary rehab) ?  ?  ?Assistance Recommended at Discharge Intermittent Supervision/Assistance  ?Patient can return home with the following Assistance with cooking/housework;Help with stairs or ramp for entrance ?  ?Equipment Recommendations ? None recommended by PT  ?  ?Recommendations for Other Services   ? ? ?  ?Precautions / Restrictions Precautions ?Precautions: None ?Restrictions ?Weight Bearing Restrictions: No  ?  ? ?Mobility ? Bed Mobility ?Overal bed mobility: Modified Independent ?  ?  ?  ?  ?  ?  ?General bed mobility  comments: pt OOB in recliner at start and end of session ?  ? ?Transfers ?Overall transfer level: Needs assistance ?Equipment used: None ?Transfers: Sit to/from Stand ?Sit to Stand: Supervision ?  ?  ?  ?  ?  ?General transfer comment: supervision for safety, no assist needed or cues for safety. pt completing without UE ?  ? ?Ambulation/Gait ?Ambulation/Gait assistance: Supervision ?Gait Distance (Feet): 600 Feet ?Assistive device: IV Pole, None ?Gait Pattern/deviations: Step-through pattern, Decreased stride length, Drifts right/left ?Gait velocity: 0.83 m/s ?Gait velocity interpretation: >2.62 ft/sec, indicative of community ambulatory ?  ?General Gait Details: pt initially reaching for UE support and therefore given IV pole to steady, then able to complete without UE support and improved stability and speed ? ? ? ? ?  ?Balance Overall balance assessment: Needs assistance ?Sitting-balance support: Feet supported, No upper extremity supported ?Sitting balance-Leahy Scale: Normal ?  ?  ?Standing balance support: Single extremity supported, No upper extremity supported, During functional activity ?Standing balance-Leahy Scale: Good ?Standing balance comment: initially seeking single UE support, then improved to no UE support without LOB ?  ?  ?  ?  ?  ?  ?  ?  ?  ?  ?  ?  ? ?  ?Cognition Arousal/Alertness: Awake/alert ?Behavior During Therapy: [facility] for tasks assessed/performed ?Overall Cognitive Status: Within Functional Limits for tasks assessed ?  ?  ?  ?  ?  ?  ?  ?  ?  ?  ?  ?  ?  ?  ?  ?  ?General Comments: pt pleasant and agreeable, demos good insight to safety ?  ?  ? ?  ?   ?  General Comments General comments (skin integrity, edema, etc.): VSS on RA ?  ?  ? ?Pertinent Vitals/Pain Pain Assessment ?Pain Assessment: No/denies pain  ? ? ? ?PT Goals (current goals can now be found in the care plan section) Acute Rehab PT Goals ?Patient Stated Goal: to return home and gradually progress activity ?PT Goal Formulation:  With patient ?Time For Goal Achievement: 05/08/21 ?Potential to Achieve Goals: Good ?Progress towards PT goals: Progressing toward goals ? ?  ?Frequency ? ? ? Min 3X/week ? ? ? ?  ?PT Plan Current plan remains appropriate  ? ? ?   ?AM-PAC PT "6 Clicks" Mobility   ?Outcome Measure ? Help needed turning from your back to your side while in a flat bed without using bedrails?: None ?Help needed moving from lying on your back to sitting on the side of a flat bed without using bedrails?: None ?Help needed moving to and from a bed to a chair (including a wheelchair)?: A Little ?Help needed standing up from a chair using your arms (e.g., wheelchair or bedside chair)?: A Little ?Help needed to walk in hospital room?: A Little ?Help needed climbing 3-5 steps with a railing? : A Little ?6 Click Score: 20 ? ?  ?End of Session Equipment Utilized During Treatment: Gait belt ?Activity Tolerance: Patient tolerated treatment well ?Patient left: in chair;with call bell/phone within reach;with family/visitor present ?Nurse Communication: Mobility status ?PT Visit Diagnosis: Other abnormalities of gait and mobility (R26.89) ?  ? ? ?Time: 7544-9201 ?PT Time Calculation (min) (ACUTE ONLY): 21 min ? ?Charges:  $Therapeutic Exercise: 8-22 mins          ?          ? ?West Carbo, PT, DPT  ? ?Acute Rehabilitation Department ?Pager #: [phone] ? ? ?Sandra Cockayne ?04/27/2021, 3:09 PM ? ?

## 2021-04-27 NOTE — Progress Notes (Signed)
?  ?  Durable Medical Equipment  ?(From admission, onward)  ?  ? ? ?  ? ?  Start     Ordered  ? 04/27/21 1121  For home use only DME Bedside commode  Once       ?Question:  Patient needs a bedside commode to treat with the following condition  Answer:  Weakness  ? 04/27/21 1122  ? 04/26/21 0854  For home use only DME Hospital bed  Once       ?Question Answer Comment  ?Length of Need Lifetime   ?Bed type Semi-electric   ?  ? 04/26/21 0853  ? 04/24/21 1252  For home use only DME Shower stool  Once       ? 04/24/21 1252  ? ?  ?  ? ?  ?  ?

## 2021-04-27 NOTE — Progress Notes (Signed)
?                                                   ?Palliative Care Progress Note, Assessment & Plan  ? ?Patient Name: Miguel Patterson       Date: 04/27/2021 ?DOB: 1945/10/29  Age: 76 y.o. MRN#: [id number] ?Attending Physician: Axel Filler, * ?Primary Care Physician: Nickola Major, MD ?Admit Date: 04/20/2021 ? ?Reason for Consultation/Follow-up: Establishing goals of care ? ?Subjective: ?Patient is sitting up in bed in no apparent distress.  He is on room air and breathing with respirations even and unlabored.  He is able to acknowledge my presence and make his wishes known.  No family currently at bedside. ? ?HPI: ?76 y.o. male  with past medical history of severe heart failure with ICD, CKD (stage IIIb), constipation, acid reflux admitted on 04/20/2021 with dyspnea and orthopnea.  He was treated for fluid overload and cardiogenic shock. He has had significant fluid output and feels much improved since admission. He was cardioverted yesterday without complication. Plan is to discharge home with [facility] agency to help facilitate home dobutamine infusions.  ? ?Summary of counseling/coordination of care: ?After reviewing the patient's chart and assessing the patient at bedside, spoke again with patient about plans moving forward.  He shares he reviewed the [facility] paperwork as well as the MOST form from yesterday.  He says he does not want anyone to be able to reverse his wishes and his HCPOA.  We reviewed the sections that outlines where he can choose either to have his healthcare agent be able to make decisions for him or where he can said they must honor that document.  I also advised him the spiritual care chaplain will review these options in detail. ? ?We briefly reviewed the MOST form but patient was not ready to complete it today. ? ?Therapeutic silence and  active listening provided for patient to share his thoughts and emotions regarding his current health status.  He shares a member of his family is a physician who stated that if he stays on the dobutamine and stays active that he may have 20 more active years of life left.  I shared that I do not think he has 20 years of life left and that he is approaching end-of-life.  He appreciated my realistic approach and says he does not think he has 20 years left but it cannot hurt to hope.  We discussed human mortality and the physical limitations of the body. ? ?During our discussions, case manager and attending Dr. Evette Doffing with his team visited with the patient.  Various discussions were had regarding patient going home with dobutamine as an eye fusion through his PICC line.  Patient shares he would like a hospital bed at home.  He asked appropriate questions regarding the infusion and hospital bed. ? ?Spiritual care to visit with patient regarding HCPOA paperwork.  Goals are clear.  Plan is set.  Outpatient palliative to follow patient at discharge.  ? ?PMT will shadow the patient's chart and intervene at patient/family's request, if goals change, or if patient's health declines. ? ?Code Status: ?DNR ? ?Prognosis: ?< 6 months ? ?Discharge Planning: ?Home with Palliative Services ? ?Care plan was discussed with patient, Dr. Evette Doffing, patient's DIL Miguel Patterson, CM Tomi Bamberger ? ?Physical Exam ?Constitutional:   ?  General: He is not in acute distress. ?   Appearance: He is not ill-appearing.  ?HENT:  ?   Head: Normocephalic.  ?Eyes:  ?   Pupils: Pupils are equal, round, and reactive to light.  ?Cardiovascular:  ?   Rate and Rhythm: Normal rate.  ?Pulmonary:  ?   Effort: Pulmonary effort is normal.  ?Abdominal:  ?   Palpations: Abdomen is soft.  ?Musculoskeletal:  ?   Cervical back: Normal range of motion.  ?   Comments: Bilateral LE weakness  ?Neurological:  ?   Mental Status: He is alert and oriented to person, place, and  time.  ?Psychiatric:     ?   Mood and Affect: Mood normal. Mood is not anxious.     ?   Behavior: Behavior normal. Behavior is not agitated.  ?         ? ?Palliative Assessment/Data: 50% ? ? ? ?Total Time 35 minutes  ?Greater than 50%  of this time was spent counseling and coordinating care related to the above assessment and plan. ? ?Thank you for allowing the Palliative Medicine Team to assist in the care of this patient. ? ?Verdell Carmine. Elvie Palomo, DNP, FNP-BC ?Palliative Medicine Team ?Team Phone # [phone] ?  ?

## 2021-04-27 NOTE — Progress Notes (Signed)
Home Paraenteral Inotropic Therapy : Data Collection Form ? ?Patients name: Miguel Patterson   Date: 04/27/21 ? ?Information below may not be completed by the supplier nor anyone in a Financial relationship with the supplier. ? ?1. Results of invasive hemodynamic monitoring ? ?Cardiac Index ?Before Inotrope infusion:          NA        CoOx     41 ?On Inotrope infusion:            1.7               ?Drug and dose:   Dobutamine 2.62mcg/Kg/Min ? ?2. Cardiac medications immediately prior to inotrope infusion (List name, dose, and frequency) ? ?Coreg 3.125 mg BID, Aldactone 12.5 mg daily, Torsemide 40 mg BID, Metolazone 5 mg daily ? ?3. Dose this represent maximum tolerated doses of these medications? Yes.  ? ?4. Breathing status ?Prior to inotrope infusion: Dyspnea at rest Yes Sats in 70s ?At time of discharge: Dyspnea on moderate exertion. O2 Sats 97-99% ? ?5. Initial home prescription ?Drug and Dose:   2.70mcg/kg/min for continuous infusion 24/hr day and 7 days/week for 365 days. ? ?6. If continuous infusion is prescribed, have attempts to discontinue inotrope infusion in the hospital failed?   Yes.  ? ?7. If intermittent infusion is prescribed, have there been repeated hospitalizations for heart failure which Parenteral inotrope were required? Not applicable.  ? ?8. Is patient capable of going to the physician for outpatient evaluation? Yes.  ? ?9. Is routine electrocardiographic monitoring required in the Home?  No.  ? 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  Patient previously very active.  He is now in class IV acute on chronic systolic heart failure.  Only with inotropes, his renal function improved and stabilized, was able to diurese about 2 L, however you want to reduce the dose of the dobutamine from 2.5 to 1 mcg, his CoOx fell down to 50s from 60s.  Patient also felt  poorly. ? ?Again being back on dobutamine, he is feeling the best he has in a while.  He has good family support as well and understands that this is end-stage cardiomyopathy and palliative only. ? ?The above statements and any additional explanations included separately are true and accurate and there is documentation present in the patients medical record to support these statements.  ? ?Completed by  ? ? ?Adrian Prows, MD, [facility] ?04/27/2021, 5:06 PM ?Office: [phone] ?Fax: [phone] ?Pager: [phone]  ?

## 2021-04-28 ENCOUNTER — Encounter (HOSPITAL_COMMUNITY): Payer: Medicare Other

## 2021-04-28 ENCOUNTER — Ambulatory Visit: Payer: Medicare Other | Admitting: Student

## 2021-04-28 ENCOUNTER — Other Ambulatory Visit (HOSPITAL_COMMUNITY): Payer: Self-pay

## 2021-04-28 DIAGNOSIS — Z7189 Other specified counseling: Secondary | ICD-10-CM

## 2021-04-28 DIAGNOSIS — N179 Acute kidney failure, unspecified: Secondary | ICD-10-CM

## 2021-04-28 LAB — BASIC METABOLIC PANEL
Anion gap: 12 (ref 5–15)
BUN: 90 mg/dL — ABNORMAL HIGH (ref 8–23)
CO2: 27 mmol/L (ref 22–32)
Calcium: 8.4 mg/dL — ABNORMAL LOW (ref 8.9–10.3)
Chloride: 92 mmol/L — ABNORMAL LOW (ref 98–111)
Creatinine, Ser: 2.26 mg/dL — ABNORMAL HIGH (ref 0.61–1.24)
GFR, Estimated: 30 mL/min — ABNORMAL LOW (ref >60)
Glucose, Bld: 124 mg/dL — ABNORMAL HIGH (ref 70–99)
Potassium: 3.4 mmol/L — ABNORMAL LOW (ref 3.5–5.1)
Sodium: 131 mmol/L — ABNORMAL LOW (ref 135–145)

## 2021-04-28 LAB — COOXEMETRY PANEL
Carboxyhemoglobin: 1 % (ref 0.5–1.5)
Methemoglobin: 2.5 % — ABNORMAL HIGH (ref 0.0–1.5)
O2 Saturation: 57.1 %
Total hemoglobin: 13.5 g/dL (ref 12.0–16.0)

## 2021-04-28 LAB — MAGNESIUM: Magnesium: 2.5 mg/dL — ABNORMAL HIGH (ref 1.7–2.4)

## 2021-04-28 MED ORDER — POTASSIUM CHLORIDE CRYS ER 20 MEQ PO TBCR
60.0000 meq | EXTENDED_RELEASE_TABLET | Freq: Every day | ORAL | 6 refills | Status: DC
  Filled 2021-04-28: qty 90, 30d supply, fill #0

## 2021-04-28 MED ORDER — MAGNESIUM OXIDE 400 MG PO TABS
400.0000 mg | ORAL_TABLET | Freq: Two times a day (BID) | ORAL | 0 refills | Status: DC
Start: 2021-04-28 — End: 2021-06-20
  Filled 2021-04-28: qty 60, 30d supply, fill #0

## 2021-04-28 MED ORDER — POTASSIUM CHLORIDE CRYS ER 20 MEQ PO TBCR
20.0000 meq | EXTENDED_RELEASE_TABLET | Freq: Once | ORAL | Status: AC
  Administered 2021-04-28: 20 meq via ORAL
  Filled 2021-04-28: qty 1

## 2021-04-28 MED ORDER — ENSURE ENLIVE PO LIQD
237.0000 mL | Freq: Two times a day (BID) | ORAL | Status: DC
  Administered 2021-04-28: 237 mL via ORAL

## 2021-04-28 MED ORDER — APIXABAN 5 MG PO TABS
5.0000 mg | ORAL_TABLET | Freq: Two times a day (BID) | ORAL | 0 refills | Status: DC
  Filled 2021-04-28: qty 60, 30d supply, fill #0

## 2021-04-28 MED ORDER — POLYETHYLENE GLYCOL 3350 17 GM/SCOOP PO POWD
17.0000 g | Freq: Two times a day (BID) | ORAL | 0 refills | Status: DC
  Filled 2021-04-28: qty 238, 7d supply, fill #0

## 2021-04-28 MED ORDER — TORSEMIDE 20 MG PO TABS
40.0000 mg | ORAL_TABLET | Freq: Two times a day (BID) | ORAL | 0 refills | Status: DC
  Filled 2021-04-28: qty 120, 30d supply, fill #0

## 2021-04-28 MED ORDER — AMIODARONE HCL 200 MG PO TABS
200.0000 mg | ORAL_TABLET | Freq: Every day | ORAL | 0 refills | Status: DC
  Filled 2021-04-28: qty 30, 30d supply, fill #0

## 2021-04-28 MED ORDER — METOLAZONE 2.5 MG PO TABS
ORAL_TABLET | ORAL | 0 refills | Status: DC
  Filled 2021-04-28 (×2): qty 30, 30d supply, fill #0

## 2021-04-28 MED ORDER — PANTOPRAZOLE SODIUM 40 MG PO TBEC
40.0000 mg | DELAYED_RELEASE_TABLET | Freq: Every day | ORAL | 0 refills | Status: DC
Start: 2021-04-28 — End: 2021-05-19
  Filled 2021-04-28: qty 30, 30d supply, fill #0

## 2021-04-28 MED ORDER — ENSURE ENLIVE PO LIQD: 237.0000 mL | Freq: Three times a day (TID) | ORAL | Status: DC

## 2021-04-28 NOTE — TOC Progression Note (Addendum)
Transition of Care (TOC) - Progression Note  ? ? ?Patient Details  ?Name: Miguel Patterson ?MRN: [id number] ?Date of Birth: 02-28-1945 ? ?Transition of Care (TOC) CM/SW Contact  ?Zenon Mayo, RN ?Phone Number: ?04/28/2021, 10:04 AM ? ?Clinical Narrative:    ?Son, [name] is the HPOA, he will be here today at 2 pm to get the education and teaching from [facility]  The hospital bed will be delivered today, and [name]' wife is coordinating that.  Patient is for dc today.  Son would like to have a rolling walker, NCM made referral to [facility] with [employer]. This will be brought up to  room prior to dc. ? ? ?Expected Discharge Plan: [place] ?Barriers to Discharge: Continued Medical Work up ? ?Expected Discharge Plan and Services ?Expected Discharge Plan: [place] ?  ?Discharge Planning Services: CM Consult ?Post Acute Care Choice: Home Health ?Living arrangements for the past 2 months: [place] ?                ?DME Arranged: Hospital bed ?DME Agency: [employer] ?Date DME Agency Contacted: 04/27/21 ?Time DME Agency Contacted: [id number] ?Representative spoke with at DME Agency: Annie Sable ?HH Arranged: RN (IV Dobutamine infusion) ?[place] Agency: [place] ?Date HH Agency Contacted: 04/27/21 ?Time [place]: 3299 ?Representative spoke with at [place]: Amy/Pam ? ? ?Social Determinants of Health (SDOH) Interventions ?  ? ?Readmission Risk Interventions ?   ? View : No data to display.  ?  ?  ?  ? ? ?

## 2021-04-28 NOTE — Plan of Care (Signed)
?  Problem: Education: ?Goal: Ability to demonstrate management of disease process will improve ?Outcome: Adequate for Discharge ?Goal: Ability to verbalize understanding of medication therapies will improve ?Outcome: Adequate for Discharge ?Goal: Individualized Educational Video(s) ?Outcome: Adequate for Discharge ?  ?Problem: Activity: ?Goal: Capacity to carry out activities will improve ?Outcome: Adequate for Discharge ?  ?Problem: Cardiac: ?Goal: Ability to achieve and maintain adequate cardiopulmonary perfusion will improve ?Outcome: Adequate for Discharge ?  ?Problem: Education: ?Goal: Ability to demonstrate management of disease process will improve ?Outcome: Adequate for Discharge ?Goal: Ability to verbalize understanding of medication therapies will improve ?Outcome: Adequate for Discharge ?Goal: Individualized Educational Video(s) ?Outcome: Adequate for Discharge ?  ?Problem: Activity: ?Goal: Capacity to carry out activities will improve ?Outcome: Adequate for Discharge ?  ?Problem: Cardiac: ?Goal: Ability to achieve and maintain adequate cardiopulmonary perfusion will improve ?Outcome: Adequate for Discharge ?  ?Problem: Acute Rehab PT Goals(only PT should resolve) ?Goal: Patient Will Transfer Sit To/From Stand ?Outcome: Adequate for Discharge ?Goal: Pt Will Ambulate ?Outcome: Adequate for Discharge ?Goal: Pt Will Go Up/Down Stairs ?Outcome: Adequate for Discharge ?Goal: Pt/caregiver will Perform Home Exercise Program ?Outcome: Adequate for Discharge ?  ?Problem: Acute Rehab OT Goals (only OT should resolve) ?Goal: OT Additional ADL Goal #1 ?Outcome: Adequate for Discharge ?  ?Problem: Increased Nutrient Needs (NI-5.1) ?Goal: Food and/or nutrient delivery ?Description: Individualized approach for food/nutrient provision. ?Outcome: Adequate for Discharge ?  ?

## 2021-04-28 NOTE — Progress Notes (Signed)
This chaplain is present with the Pt., Pt. daughter-Anastasia, notary, and witnesses for the notarizing of the Pt. Advance Directive:  HCPOA and Living Will.  ? ?The Pt. named Miguel Patterson as his HCPOA. ? ?The chaplain gave the Pt. the original AD along with one copy. The chaplain scanned a copy of the Pt. AD in EMR. ? ?The chaplain is available for F/U spiritual care as needed. ? ?Chaplain Sallyanne Kuster ?[phone] ?

## 2021-04-28 NOTE — Progress Notes (Incomplete)
Informed case manager that daughter Miguel Patterson was told by sister-in-law to meet with [facility] for home health teaching on PICC and dobutamine drip. As the primary nurse, I told Miguel Patterson that she could NOT second hand teach and the POA Miguel Patterson and his wife had to be present for all teachings. ?Informed her that the case manager would call Miguel Patterson and his wife and clarify the process. ? ?The patient has met with [name] and has indicated that Miguel Patterson his son would be POA. I spoke with the patient's other son and his daughter that neither one is responsible for teaching the daughter in law. ? ?FYI  ?There are cultural and family dynamics as it relates to Miguel Patterson Miguel Patterson culture. His daughter also acknowledges her role or place in her father's cultural beliefs to me. She loves her father but has always known that her brother Miguel Patterson would be the one her father would choose as POA. Both she and the other brother love their father and both stated to me that the sister-in-law and their brother Miguel Patterson should be here for all education and teaching by [facility]. ?

## 2021-04-28 NOTE — Progress Notes (Signed)
Pt left with son at 109 in wheelchair escorted off the unit by Rowe Robert. ?

## 2021-04-28 NOTE — Progress Notes (Signed)
Patient does not have legal guardian. Patient does have a POA.  ?

## 2021-04-28 NOTE — Care Management Important Message (Signed)
Important Message ? ?Patient Details  ?Name: Miguel Patterson ?MRN: [id number] ?Date of Birth: 07/22/1945 ? ? ?Medicare Important Message Given:  Yes ? ? ? ? ?[name] ?04/28/2021, 7:25 AM ?

## 2021-04-28 NOTE — Progress Notes (Signed)
CVP is 5 this morning at 0800 hour time frame. ?

## 2021-04-28 NOTE — Progress Notes (Signed)
Initial Nutrition Assessment ? ?DOCUMENTATION CODES:  ? ?Not applicable ? ?INTERVENTION:  ? ?- Increase Ensure Enlive po to TID, each supplement provides 350 kcal and 20 grams of protein ? ?- Encourage PO intake ? ?NUTRITION DIAGNOSIS:  ? ?Increased nutrient needs related to chronic illness (CHF, CKD) as evidenced by estimated needs. ? ?GOAL:  ? ?Patient will meet greater than or equal to 90% of their needs ? ?MONITOR:  ? ?PO intake, Supplement acceptance, Labs, Weight trends, I & O's ? ?REASON FOR ASSESSMENT:  ? ?Malnutrition Screening Tool ?  ? ?ASSESSMENT:  ? ?76 year old male who presented to the ED on 4/20 from Cardiologist office with SOB. PMH of CHF, atrial fibrillation, CKD stage IIIb. Pt admitted with cardiogenic shock, acute on chronic renal failure. ? ?04/25 - s/p cardioversion ? ?Pt remains on dobutamine. Per notes, plan is for pt to d/c home with dobutamine. ? ?Pt unavailable at time of RD visit. Pt receiving discharge education regarding dobutamine with plans for pt to d/c home today. ? ?Pt on a 2 gram sodium diet with 7 out of last 8 documented meal completions being 100%. ? ?Reviewed weight history in chart. Pt's weight has fluctuated significantly over the last year between 65 kg (current weight) up to 81 kg in November 2022. Overall trend is down. Pt with a total weight loss of 15.5 kg since 11/22/20. This is a 19.2% weight loss in 5 months which is severe and significant for timeframe. However, pt with a 5.6 kg weight loss just since admission which is likely related to volume status given pt is net negative 16.6 L since admission. Suspect some of overall weight loss can be attributed to fluid status but also suspect true dry weight loss is present. Pt is at risk for malnutrition and may already meet criteria but RD unable to confirm without NFPE. ? ?RD will continue with Ensure Enlive supplements and increase from BID to TID. ? ?Admit weight: 71 kg ?Current weight: 65.4 kg ? ?Meal Completion:  100% x last 4 meals ? ?Medications reviewed and include: Ensure Enlive BID, protonix, miralax, senna, torsemide, dobutamine drip ? ?Labs reviewed: sodium 131, potassium 3.4, BUN 90, creaitnine 2.26, magnesium 2.5 ? ?UOP: 2075 ml x 24 hours ?I/O's: -16.6 L since admit ? ?NUTRITION - FOCUSED PHYSICAL EXAM: ? ?Unable to complete at this time. ? ?Diet Order:   ?Diet Order   ? ?       ?  Diet - low sodium heart healthy       ?  ?  Diet 2 gram sodium Room service appropriate? Yes; Fluid consistency: Thin  Diet effective now       ?  ? ?  ?  ? ?  ? ? ?EDUCATION NEEDS:  ? ?No education needs have been identified at this time ? ?Skin:  Skin Assessment: Reviewed RN Assessment ? ?Last BM:  04/26/21 ? ?Height:  ? ?Ht Readings from Last 1 Encounters:  ?04/25/21 5\' 10"  (1.778 m)  ? ? ?Weight:  ? ?Wt Readings from Last 1 Encounters:  ?04/28/21 65.4 kg  ? ? ?BMI:  Body mass index is 20.69 kg/m?. ? ?Estimated Nutritional Needs:  ? ?Kcal:  2000-2200 ? ?Protein:  100-115 grams ? ?Fluid:  2.0 L/day ? ? ? ?Gustavus Bryant, MS, RD, LDN ?Inpatient Clinical Dietitian ?Please see AMiON for contact information. ? ?

## 2021-04-28 NOTE — Discharge Summary (Signed)
? ?Name: Miguel Patterson ?MRN: [id number] ?DOB: 08/15/1945 76 y.o. ?PCP: Nickola Major, MD ? ?Date of Admission: 04/20/2021 11:36 AM ?Date of Discharge: 04/28/2021 ?Attending Physician: Dr. Evette Doffing ? ?Discharge Diagnosis: ?Principal Problem: ?  Cardiogenic shock ([place]) ?Active Problems: ?  Paroxysmal atrial fibrillation ([place]) ?  Acute on chronic HFrEF (heart failure with reduced ejection fraction) ([place]) ?  Acute renal failure superimposed on stage 3a chronic kidney disease ([place]) ?  ? ?Discharge Medications: ?Allergies as of 04/28/2021   ? ?   Reactions  ? Losartan Potassium Anaphylaxis  ? angioedema  ? Aspirin Other (See Comments)  ? Stomach pain  ? ?  ? ?  ?Medication List  ?  ? ?STOP taking these medications   ? ?carvedilol 3.125 MG tablet ?Commonly known as: COREG ?  ?polyethylene glycol 17 g packet ?Commonly known as: MIRALAX / GLYCOLAX ?Replaced by: polyethylene glycol powder 17 GM/SCOOP powder ?  ?Potassium Chloride ER 20 MEQ Tbcr ?Replaced by: potassium chloride SA 20 MEQ tablet ?  ?spironolactone 25 MG tablet ?Commonly known as: ALDACTONE ?  ? ?  ? ?TAKE these medications   ? ?amiodarone 200 MG tablet ?Commonly known as: PACERONE ?Take 1 tablet (200 mg total) by mouth daily. ?  ?DOBUTamine 4-5 MG/ML-% infusion ?Commonly known as: DOBUTREX ?Inject 177.5 mcg/min into the vein continuous. ?  ?Eliquis 5 MG Tabs tablet ?Generic drug: apixaban ?Take 1 tablet (5 mg total) by mouth 2 (two) times daily. ?  ?magnesium oxide 400 MG tablet ?Commonly known as: MAG-OX ?Take 1 tablet (400 mg total) by mouth 2 (two) times daily. ?  ?metolazone 2.5 MG tablet ?Commonly known as: ZAROXOLYN ?TAKE 1 TABLET BY MOUTH DAILY AS NEEDED FOR WORSENING SHORTNESS OF BREATHE/ LEG SWELLING ?What changed:  ?how much to take ?how to take this ?when to take this ?reasons to take this ?additional instructions ?  ?pantoprazole 40 MG tablet ?Commonly known as: PROTONIX ?Take 1 tablet (40 mg total) by mouth at bedtime. ?  ?polyethylene  glycol powder 17 GM/SCOOP powder ?Commonly known as: GLYCOLAX/MIRALAX ?Take 17 g by mouth 2 (two) times daily. ?Replaces: polyethylene glycol 17 g packet ?  ?potassium chloride SA 20 MEQ tablet ?Commonly known as: KLOR-CON M ?Take 3 tablets (60 mEq total) by mouth daily. Take with metolazone. ?Replaces: Potassium Chloride ER 20 MEQ Tbcr ?  ?torsemide 20 MG tablet ?Commonly known as: DEMADEX ?Take 2 tablets (40 mg total) by mouth 2 (two) times daily. ?  ? ?  ? ?  ?  ? ? ?  ?Durable Medical Equipment  ?(From admission, onward)  ?  ? ? ?  ? ?  Start     Ordered  ? 04/28/21 1343  For home use only DME Walker rolling  Once       ?Question Answer Comment  ?Walker: With 5 Inch Wheels   ?Patient needs a walker to treat with the following condition Weakness   ?  ? 04/28/21 1343  ? 04/27/21 1121  For home use only DME Bedside commode  Once       ?Question:  Patient needs a bedside commode to treat with the following condition  Answer:  Weakness  ? 04/27/21 1122  ? 04/26/21 0854  For home use only DME Hospital bed  Once       ?Question Answer Comment  ?Length of Need Lifetime   ?Bed type Semi-electric   ?  ? 04/26/21 0853  ? 04/24/21 1252  For home use only DME  Shower stool  Once       ? 04/24/21 1252  ? ?  ?  ? ?  ? ? ?Disposition and follow-up:   ?Miguel Patterson was discharged from [facility] in Stable condition.  At the hospital follow up visit please address: ? ?1.  Follow-up: ? a.  Cardiogenic shock/low output heart failure.  Patient started on palliative home dobutamine infusion.  Requiring potassium supplementation.  Beta-blocker being held. ?  ? b.  Chronic kidney disease-likely related to low output heart failure. ? ? C.  Atrial fibrillation.  Patient status post cardioversion this admission patient.  He is a BiV dual paced rhythm.  Remains on amiodarone and Eliquis. ? ? ?2.  Labs / imaging needed at time of follow-up: BMP ? ?3.  Pending labs/ test needing follow-up: None ? ?4.  Medication  Changes ? Started: Home dobutamine infusion ? ? ?Follow-up Appointments: ? Follow-up Information   ? ? [facility] Follow up.   ?Why: HHRN for dobutamine infusion ?Contact information: ?[address] ?[place] [address] ?[phone] ? ? ?  ?  ? ? Nickola Major, MD. Go on 05/03/2021.   ?Specialty: Family Medicine ?Why: @9 :20am ?Contact information: ?[address] ?[place] [address] ?[phone] ? ? ?  ?  ? ? Adrian Prows, MD. Go on 05/08/2021.   ?Specialty: Cardiology ?Why: @3 :00pm ?Contact information: ?[address] ?[address] ?[place] [address] ?[phone] ? ? ?  ?  ? ? Deboraha Sprang, MD .   ?Specialty: Cardiology ?Contact information: ?[address] ?[address] ?[place] [address] ?[phone] ? ? ?  ?  ? ? [facility] Follow up.   ?Why: rolling walker ?Contact information: ?[address] ?[place] [address] ?[phone] ? ? ?  ?  ? ?  ?  ? ?  ? ? ?Hospital Course by problem list:  ?Non-compaction cardiomyopathy ?Cardiogenic shock  ?Severe HFrEF ?Severe valvular disease  ?-Patient was admitted on 4/20 for cardiogenic shock with symptoms of increased shortness of breath and lower extremity edema in the setting of his heart failure due to non-compaction cardiomyopathy.  ?-He was started on inotrope support with dobutamine and was also started on IV Lasix for diuresis. PICC was placed to obtain CVP and co-ox values during his hospital stay.  ?-Echocardiogram during this admission showed severely reduced EF at <20% as well as severe mitral regurgitation. ?-Patient improved clinically during his hospital stay and diuresed well on Lasix which was later switched to torsemide. However his co-ox values remained low, therefore patient remained on the dobutamine drip which was down-titrated slowly over the course of his admission.    ?-Patient continued to have difficulty tolerating lower doses of dobutamine down to 1 mcg/kg/min and therefore was raised  back up to 2.5 by the cardiology team at time of discharge.  ?-Patient had a long discussion with his cardiologist, Dr. Adrian Prows, as well as the palliative care team and it was decided that patient will be able to be discharged home with home palliative care and outpatient dobutamine therapy.  ? ?Acute on chronic kidney disease secondary to cardiorenal syndrome ?-BUN 111 and creatinine 3.46 at time of admission thought to be due to cardiorenal syndrome.  ?-Kidney function improved with patient on dobutamine and diuretics.  ? ?Atrial fibrillation ?-Patient now s/p cardioversion on 4/25.  ?-He remains on amiodarone and Eliquis.  ? ? ?Discharge Subjective: ?Stating he  feels well today.  Reports that bed has been delivered.  He is concerned about going home with PEG because he is not sure how he will care for it. ? ?Discharge Exam:   ?BP 103/64 (BP Location: Left Arm)   Pulse 73   Temp 97.9 ?F (36.6 ?C) (Oral)   Resp 14   Ht 5\' 10"  (1.778 m)   Wt 65.4 kg   SpO2 96%   BMI 20.69 kg/m?  ?Constitutional: Chronically ill appearing male, in no acute distress ?Cardiovascular: regular rate and rhythm, no m/r/g, extremities warm, palpable pedal pulses ?Pulmonary/Chest: normal work of breathing on room air, lungs clear to auscultation bilaterally ?Skin: warm and dry ?Psych: Mood and affect appropriate ? ?Pertinent Labs, Studies, and Procedures:  ? ?  Latest Ref Rng & Units 04/25/2021  ?  3:27 AM 04/24/2021  ?  7:36 AM 04/20/2021  ? 11:55 AM  ?CBC  ?WBC 4.0 - 10.5 K/uL 8.7   9.6   7.7    ?Hemoglobin 13.0 - 17.0 g/dL 13.8   14.0   14.0    ?Hematocrit 39.0 - 52.0 % 40.8   42.8   42.2    ?Platelets 150 - 400 K/uL 183   176   142    ? ? ? ?  Latest Ref Rng & Units 04/28/2021  ?  5:56 AM 04/27/2021  ?  6:20 AM 04/26/2021  ?  5:52 AM  ?CMP  ?Glucose 70 - 99 mg/dL 124   113   138    ?BUN 8 - 23 mg/dL 90   82   85    ?Creatinine 0.61 - 1.24 mg/dL 2.26   1.97   2.45    ?Sodium 135 - 145 mmol/L 131   133   132    ?Potassium 3.5 - 5.1  mmol/L 3.4   3.5   4.4    ?Chloride 98 - 111 mmol/L 92   93   92    ?CO2 22 - 32 mmol/L 27   28   29     ?Calcium 8.9 - 10.3 mg/dL 8.4   8.7   8.7    ? ? ?DG Chest 1 View ? ?Addendum Date: 04/21/2021   ?ADDENDUM REPORT

## 2021-04-28 NOTE — Discharge Instructions (Signed)
Patient is alert and oriented with son at the bedside awaiting discharge. The education and teaching have been completed by PAM. No signs nor symptoms of distress noted. PICC line dressing in place. No redness, discoloration, nor drainage noted at site of PICC/midline. ?

## 2021-04-28 NOTE — Progress Notes (Signed)
?                                                                                                                                                     ?                                                   ?Palliative Medicine Progress Note  ? ?Patient Name: Miguel Patterson       Date: 04/28/2021 ?DOB: 1945-11-15  Age: 76 y.o. MRN#: [id number] ?Attending Physician: Miguel Patterson, * ?Primary Care Physician: Miguel Major, MD ?Admit Date: 04/20/2021 ? ?Reason for Consultation/Follow-up: Goals of care ? ? ?HPI/Patient Profile: ?76 y.o. male  with past medical history of severe heart failure with ICD, CKD (stage IIIb), constipation, acid reflux admitted on 04/20/2021 with dyspnea and orthopnea.  He was treated for fluid overload and cardiogenic shock. He has had significant fluid output and feels much improved since admission. He was cardioverted yesterday without complication. Plan is to discharge home with [facility] agency to help facilitate home dobutamine infusions.  ? ? ?Subjective: ?I assessed patient at bedside.  He reports feeling good and is looking forward to going home today. ? ?He expresses appreciation for assistance with completion of advance care documents.  He tells me that he knows his time is limited and he is trying to prepare.  He also tells me that he is not ready to die. ? ? ?Advanced directives, concepts specific to code status, artifical feeding and hydration, and rehospitalization were considered and discussed. ?Miguel Patterson and I completed a MOST form today. The patient outlined his wishes for the following treatment decisions: ? ? ?Cardiopulmonary Resuscitation: Do Not Attempt Resuscitation (DNR/No CPR)  ?Medical Interventions: Limited Additional Interventions: Use medical treatment, IV fluids and cardiac monitoring as indicated, DO NOT USE intubation or mechanical ventilation. May consider use of less invasive airway support such as BiPAP or CPAP. Also provide comfort measures. Transfer  to the hospital if indicated. Avoid intensive care.   ?Antibiotics: No antibiotics (use other measures to relieve symptoms)  ?IV Fluids: IV fluids for a defined trial period  ?Feeding Tube: No feeding tube  ? ? ? ?Objective: ? ?Physical Exam ?Vitals reviewed.  ?Constitutional:   ?   General: He is not in acute distress. ?   Appearance: He is ill-appearing.  ?Pulmonary:  ?   Effort: Pulmonary effort is normal.  ?Neurological:  ?   Mental Status: He is alert and oriented to person, place, and time.  ?         ? ?Vital Signs: BP 101/70 (BP Location: Left Arm)   Pulse 72   Temp 98.2 ?F (36.8 ?  C) (Oral)   Resp 20   Ht 5\' 10"  (1.778 m)   Wt 65.4 kg   SpO2 96%   BMI 20.69 kg/m?  ?SpO2: SpO2: 96 % ?O2 Device: O2 Device: Room Air ?O2 Flow Rate: O2 Flow Rate (L/min): 3 L/min ? ? ? ?LBM: Last BM Date : 04/26/21 ? ?   ?Palliative Assessment/Data: PPS 50-60% ? ? ? ? ?Palliative Medicine Assessment & Plan  ? ?Assessment: ?Principal Problem: ?  Cardiogenic shock ([place]) ?Active Problems: ?  Paroxysmal atrial fibrillation ([place]) ?  Acute on chronic HFrEF (heart failure with reduced ejection fraction) ([place]) ?  Acute renal failure superimposed on stage 3a chronic kidney disease ([place]) ?  ? ?Recommendations/Plan: ?DNR/DNI as previously documented ?MOST form was completed - original was given to patient and a copy was made to scan into [facility] ?Pending discharge home today with dobutamine infusion - home health has been arranged to manage this ? ? ?Prognosis: ? < 6 months ? ?Discharge Planning: ?Home with Home Health ? ? ?Thank you for allowing the Palliative Medicine Team to assist in the care of this patient. ? ? ?MDM - moderate ? ? ?Lavena Bullion, NP ? ? ?Please contact Palliative Medicine Team phone at [phone] for questions and concerns.  ?For individual providers, please see AMION. ? ? ? ? ? ?

## 2021-05-01 ENCOUNTER — Encounter (HOSPITAL_COMMUNITY): Payer: Medicare Other

## 2021-05-03 ENCOUNTER — Encounter (HOSPITAL_COMMUNITY): Payer: Medicare Other

## 2021-05-05 ENCOUNTER — Encounter (HOSPITAL_COMMUNITY): Payer: Medicare Other

## 2021-05-05 ENCOUNTER — Telehealth: Payer: Self-pay

## 2021-05-05 NOTE — Telephone Encounter (Signed)
Home health nurse Kathlee Nations : called for verbal orders to draw blood today. ?  ?

## 2021-05-08 ENCOUNTER — Encounter (HOSPITAL_COMMUNITY): Payer: Medicare Other

## 2021-05-08 ENCOUNTER — Encounter: Payer: Self-pay | Admitting: Cardiology

## 2021-05-08 ENCOUNTER — Ambulatory Visit: Payer: Medicare Other | Admitting: Cardiology

## 2021-05-08 VITALS — BP 106/66 | HR 75 | Temp 98.2°F | Resp 17 | Ht 70.0 in | Wt 153.8 lb

## 2021-05-08 DIAGNOSIS — I428 Other cardiomyopathies: Secondary | ICD-10-CM

## 2021-05-08 DIAGNOSIS — I480 Paroxysmal atrial fibrillation: Secondary | ICD-10-CM

## 2021-05-08 DIAGNOSIS — I5023 Acute on chronic systolic (congestive) heart failure: Secondary | ICD-10-CM

## 2021-05-08 NOTE — Progress Notes (Addendum)
? ?Primary Physician:  Nickola Major, MD ? ? ?Patient ID: Miguel Patterson, male    DOB: 20-Jul-1945, 76 y.o.   MRN: [id number]  ? ?Chief Complaint  ?Patient presents with  ? Congestive Heart Failure  ? Atrial Fibrillation  ?  3 month  ? ?HPI  ? ? Miguel Patterson  is a 76 y.o. 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  ? ?To the hospital on 04/20/2021 and discharged on 04/28/2021 with acute pulm edema and cardiogenic shock, his cardiac medications were discontinued except diuretics, started on IV dobutamine, he diuresed close to 10 to 15 L, discharged home on home PT, nurse visits and home palliative care.  He now presents for follow-up, states that he feels the best he has in quite a while.  He is able to do all his activities of daily living at home without much restrictions. He now presents for follow-up of heart failure. ? ? ?Past Medical History:  ?Diagnosis Date  ? AICD (automatic cardioverter/defibrillator) present   ? Cardiomegaly   ? CHF (congestive heart failure) ([place])   ? Chronic combined systolic and diastolic heart failure ([place]) 02/10/2018  ? DCM (dilated cardiomyopathy) ([place])   ? Encounter for adjustment of biventricular implantable cardioverter-defibrillator (ICD) 04/03/2019  ? Fatigue   ? ICD- Biventricular Medtronic ICD Claria Mri [id number] in situ 02/28/18 03/01/2018  ? LBBB (left bundle branch block)   ? Mild tricuspid regurgitation   ? Moderate aortic regurgitation   ? Moderate mitral regurgitation   ? Pericardial effusion   ? INSIGNIFICANT  ? S/P biventricular cardiac pacemaker procedure 02/28/18 with ICD MDT  03/01/2018  ? Short of breath on exertion   ? Sleep apnea   ? Systolic heart failure  ([place])   ? Trace pulmonic regurgitation by prior echocardiogram   ? ?Past Surgical History:  ?Procedure Laterality Date  ? BIV ICD INSERTION CRT-D N/A 02/28/2018  ? Procedure: BIV ICD INSERTION CRT-D;  Surgeon: Deboraha Sprang, MD;  Location: [facility];  Service: Cardiovascular;  Laterality: N/A;  ? CARDIOVERSION N/A 04/11/2021  ? Procedure: CARDIOVERSION;  Surgeon: Adrian Prows, MD;  Location: [facility];  Service: Cardiovascular;  Laterality: N/A;  ? CARDIOVERSION N/A 04/25/2021  ? Procedure: CARDIOVERSION;  Surgeon: Adrian Prows, MD;  Location: [facility];  Service: Cardiovascular;  Laterality: N/A;  ? LEG SURGERY Left 2000  ? DUE TO BROKE LEG  ? RIGHT HEART CATH N/A 12/14/2020  ? Procedure: RIGHT HEART CATH;  Surgeon: Jolaine Artist, MD;  Location: [facility];  Service: Cardiovascular;  Laterality: N/A;  ? ?Family History  ?Problem Relation Age of Onset  ? Hypertension Mother 29  ? Thyroid disease Mother   ? Healthy Father 30  ? ?Social History  ? ?Tobacco Use  ? Smoking status: Never  ? Smokeless tobacco: Never  ?Substance Use Topics  ? Alcohol use: No  ? Marital Status: Divorced ? ?ROS:  ? ?Review of Systems  ?Constitutional: Negative for malaise/fatigue.  ?Cardiovascular:  Positive for dyspnea on exertion. Negative for chest pain, leg swelling, orthopnea and paroxysmal nocturnal dyspnea.  ? ?Objective:  ?Blood pressure 106/66, pulse 75, temperature 98.2 ?F (36.8 ?C), temperature source Temporal, resp. rate 17, height 5' 10"  (1.778 m), weight  153 lb 12.8 oz (69.8 kg), SpO2 100 %. Body mass index is 22.07 kg/m?.  ? ?  05/08/2021  ?  2:53 PM 05/08/2021  ?  2:37 PM 04/28/2021  ? 12:38 PM  ?Vitals with BMI  ?Height  5' 10"    ?Weight  153 lbs 13 oz   ?BMI  22.07   ?Systolic 267 90 124  ?Diastolic 66 62 64  ?Pulse 75 80 73  ?   ?Physical Exam ?Vitals reviewed.  ?Constitutional:   ?   Appearance: He is well-developed.  ?Neck:  ?   Vascular: No carotid bruit or JVD.  ?Cardiovascular:  ?   Rate and Rhythm:  Normal rate and regular rhythm.  ?   Pulses: Normal pulses and intact distal pulses.  ?   Heart sounds: S1 normal and S2 normal. Murmur heard.  ?High-pitched blowing holosystolic murmur is present with a grade of 3/6 at the apex.  ?  No gallop.  ?Pulmonary:  ?   Effort: Pulmonary effort is normal. No accessory muscle usage.  ?   Breath sounds: Normal breath sounds.  ?Abdominal:  ?   General: Abdomen is flat.  ?   Palpations: Abdomen is soft.  ?Musculoskeletal:  ?   Right lower leg: No edema.  ?   Left lower leg: No edema.  ?Skin: ?   General: Skin is warm.  ? ?Laboratory examination:  ? ? ?  Latest Ref Rng & Units 04/28/2021  ?  5:56 AM 04/27/2021  ?  6:20 AM 04/26/2021  ?  5:52 AM  ?CMP  ?Glucose 70 - 99 mg/dL 124   113   138    ?BUN 8 - 23 mg/dL 90   82   85    ?Creatinine 0.61 - 1.24 mg/dL 2.26   1.97   2.45    ?Sodium 135 - 145 mmol/L 131   133   132    ?Potassium 3.5 - 5.1 mmol/L 3.4   3.5   4.4    ?Chloride 98 - 111 mmol/L 92   93   92    ?CO2 22 - 32 mmol/L 27   28   29     ?Calcium 8.9 - 10.3 mg/dL 8.4   8.7   8.7    ? ? ?  Latest Ref Rng & Units 04/25/2021  ?  3:27 AM 04/24/2021  ?  7:36 AM 04/20/2021  ? 11:55 AM  ?CBC  ?WBC 4.0 - 10.5 K/uL 8.7   9.6   7.7    ?Hemoglobin 13.0 - 17.0 g/dL 13.8   14.0   14.0    ?Hematocrit 39.0 - 52.0 % 40.8   42.8   42.2    ?Platelets 150 - 400 K/uL 183   176   142    ? ?Lipid Panel  ?   ?Component Value Date/Time  ? CHOL 179 03/20/2019 0804  ? TRIG 61 03/20/2019 0804  ? HDL 51 03/20/2019 0804  ? [place] 116 (H) 03/20/2019 0804  ? ?HEMOGLOBIN A1C ?No results found for: HGBA1C, MPG ?TSH ?Recent Labs  ?  12/06/20 ?1142  ?TSH 4.190  ?  ?BNP (last 3 results) ?Recent Labs  ?  04/18/21 ?1428 04/20/21 ?1155 04/24/21 ?5809  ?BNP 1,882.3* 1,925.8* 1,094.6*  ? ?External labs: ? ?Labs 05/03/2021: ? ?Sodium 136, potassium 5.0, BUN 68, creatinine 1.92, EGFR 36 mL, serum glucose 82 mg. ? ?Current Outpatient Medications:  ?  apixaban (ELIQUIS) 5 MG TABS tablet, Take 1 tablet (5 mg total)  by mouth 2  (two) times daily., Disp: 60 tablet, Rfl: 0 ?  DOBUTamine (DOBUTREX) 4-5 MG/ML-% infusion, Inject 177.5 mcg/min into the vein continuous., Disp: 250 mL, Rfl: 12 ?  magnesium oxide (MAG-OX) 400 MG tablet, Take 1 tablet (400 mg total) by mouth 2 (two) times daily., Disp: 60 tablet, Rfl: 0 ?  metolazone (ZAROXOLYN) 2.5 MG tablet, TAKE 1 TABLET BY MOUTH DAILY AS NEEDED FOR WORSENING SHORTNESS OF BREATHE/ LEG SWELLING, Disp: 30 tablet, Rfl: 0 ?  pantoprazole (PROTONIX) 40 MG tablet, Take 1 tablet (40 mg total) by mouth at bedtime. (Patient taking differently: Take 40 mg by mouth daily as needed.), Disp: 30 tablet, Rfl: 0 ?  polyethylene glycol powder (GLYCOLAX/MIRALAX) 17 GM/SCOOP powder, Take 17 g by mouth 2 (two) times daily., Disp: 238 g, Rfl: 0 ?  potassium chloride SA (KLOR-CON M) 20 MEQ tablet, Take 3 tablets (60 mEq total) by mouth daily. Take with metolazone., Disp: 90 tablet, Rfl: 6 ?  torsemide (DEMADEX) 20 MG tablet, Take 2 tablets (40 mg total) by mouth 2 (two) times daily., Disp: 120 tablet, Rfl: 0 ?  amiodarone (PACERONE) 200 MG tablet, Take 1 tablet (200 mg total) by mouth daily., Disp: 30 tablet, Rfl: 0  ?Radiology:  ? ?No results found. ? ?Cardiac Studies:  ? ?Exercise sestamibi stress test 08/27/2014: ?1. The resting electrocardiogram demonstrated normal sinus rhythm, incomplete LBBB and no resting arrhythmias.  ST segment depression and T-wave inversion in inferior and lateral leads. Stress EKG is negative for ischemia. The patient performed treadmill exercise using a Bruce protocol, completing 6:26 minutes. Thre were occasional PVCs. The patient completed an estimated workload of 7.65 METS, 95% of the maximum predicted heart rate. The stress test was terminated because of dyspnea. ?2. The LV is markedly dilated both at rest and stress images. The LV end diastolic volume was 962XB. Thre is anterior wall thinning. There is no evidence of ischemia. Thre is severe generalized hypokinesis and LVEF markedly  depressed at 11%.  Findings are consistent with dilated cardiomyopathy.  Clinical correlation is recommended. ?  ?Cardiac MRI 11/20/2017: ?1. Severely dilated left ventricle with normal wall thickness

## 2021-05-10 ENCOUNTER — Encounter (HOSPITAL_COMMUNITY): Payer: Medicare Other

## 2021-05-12 ENCOUNTER — Telehealth (HOSPITAL_BASED_OUTPATIENT_CLINIC_OR_DEPARTMENT_OTHER): Payer: Self-pay

## 2021-05-12 ENCOUNTER — Encounter (HOSPITAL_COMMUNITY): Payer: Medicare Other

## 2021-05-12 ENCOUNTER — Other Ambulatory Visit (HOSPITAL_COMMUNITY): Payer: Self-pay

## 2021-05-12 ENCOUNTER — Ambulatory Visit: Payer: Medicare Other | Admitting: Student

## 2021-05-12 NOTE — Telephone Encounter (Signed)
Pharmacy Transitions of Care Follow-up Telephone Call ? ?Date of discharge: 04/28/2021  ?Discharge Diagnosis: Cardiogenic shock ([place]) ? ?How have you been since you were released from the hospital? Pt is doing well. States has good days and bad days, this morning was a good day and now he is just feeling quite tired. He has been following up with Dr. Einar Gip who is handling medications and refills moving forward. Pt expressed great gratitude for care team during hospitalization. ? ?Medication changes made at discharge: ? - STOPPED: carvedilol, spironolactone ?  ?Medication changes verified by the patient? Yes ?  ? ?Medication Accessibility: ? ? ?Was the patient provided with refills on discharged medications? Cardiologist is managing medications and refills moving forward.  ? ?Garrel Ridgel, PharmD Candidate ?[facility] ? ? ? ?

## 2021-05-15 ENCOUNTER — Encounter: Payer: Self-pay | Admitting: Cardiology

## 2021-05-15 ENCOUNTER — Encounter (HOSPITAL_COMMUNITY): Payer: Medicare Other

## 2021-05-15 NOTE — Progress Notes (Signed)
Labs 05/12/2021: ? ?Hb 13.1/HCT 40.1, platelets 109, normal indicis. ? ?BUN 93, creatinine 2.20, EGFR 30 mL, sodium 132, potassium 3.3, magnesium 2.9.

## 2021-05-16 ENCOUNTER — Ambulatory Visit: Payer: Medicare Other | Admitting: Student

## 2021-05-16 ENCOUNTER — Other Ambulatory Visit: Payer: Self-pay

## 2021-05-16 ENCOUNTER — Encounter: Payer: Self-pay | Admitting: Student

## 2021-05-16 VITALS — BP 90/68 | HR 80 | Temp 98.2°F | Resp 17 | Ht 70.0 in | Wt 156.2 lb

## 2021-05-16 DIAGNOSIS — I428 Other cardiomyopathies: Secondary | ICD-10-CM

## 2021-05-16 DIAGNOSIS — I480 Paroxysmal atrial fibrillation: Secondary | ICD-10-CM

## 2021-05-16 MED ORDER — DOBUTAMINE IN D5W 4-5 MG/ML-% IV SOLN: 5.0000 ug/kg/min | INTRAVENOUS | 12 refills | Status: DC

## 2021-05-16 MED ORDER — POTASSIUM CHLORIDE CRYS ER 20 MEQ PO TBCR: 80.0000 meq | EXTENDED_RELEASE_TABLET | Freq: Every day | ORAL | 6 refills | Status: DC

## 2021-05-16 NOTE — Telephone Encounter (Signed)
From pt

## 2021-05-16 NOTE — Progress Notes (Signed)
? ?Primary Physician:  Nickola Major, MD ? ? ?Patient ID: Miguel Patterson, male    DOB: [birth year], 76 y.o.   MRN: [id number]  ? ?Chief Complaint  ?Patient presents with  ? Shortness of Breath  ? ?HPI  ? ? Miguel Patterson  is a 76 y.o. 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  ? ?To the hospital on 04/20/2021 and discharged on 04/28/2021 with acute pulm edema and cardiogenic shock, his cardiac medications were discontinued except diuretics, started on IV dobutamine, he diuresed close to 10 to 15 L, discharged home on home PT, nurse visits and home palliative care.   ? ?Patient subsequently followed up in our office 05/08/2021 at which time patient had inadvertently stopped amiodarone and was advised to resume this.  Presents for urgent visit at his request with complaints of orthopnea worsening over the last 2 to 3 days as well as dyspnea on exertion.  He also reports significant fatigue and inability to sleep due to orthopnea.  He has also noticed minimal urine output for the last 2 to 3 days. ? ?He is presently on 2.5 mcg/kg/min of dobutamine, taking Zaroxolyn 2.5 mg p.o. daily as well as torsemide 40 mg p.o. twice daily. ? ?Past Medical History:  ?Diagnosis Date  ? AICD (automatic cardioverter/defibrillator) present   ? Cardiomegaly   ? CHF (congestive heart failure) ([place])   ? Chronic combined systolic and diastolic heart failure ([place]) 02/10/2018  ? DCM (dilated cardiomyopathy) ([place])   ? Encounter for adjustment of biventricular implantable cardioverter-defibrillator (ICD) 04/03/2019  ? Fatigue   ? ICD- Biventricular Medtronic ICD Claria Mri [id number] in situ 02/28/18 03/01/2018  ? LBBB (left bundle branch block)    ? Mild tricuspid regurgitation   ? Moderate aortic regurgitation   ? Moderate mitral regurgitation   ? Pericardial effusion   ? INSIGNIFICANT  ? S/P biventricular cardiac pacemaker procedure 02/28/18 with ICD MDT  03/01/2018  ? Short of breath on exertion   ? Sleep apnea   ? Systolic heart failure ([place])   ? Trace pulmonic regurgitation by prior echocardiogram   ? ?Past Surgical History:  ?Procedure Laterality Date  ? BIV ICD INSERTION CRT-D N/A 02/28/2018  ? Procedure: BIV ICD INSERTION CRT-D;  Surgeon: Deboraha Sprang, MD;  Location: [facility];  Service: Cardiovascular;  Laterality: N/A;  ? CARDIOVERSION N/A 04/11/2021  ? Procedure: CARDIOVERSION;  Surgeon: Adrian Prows, MD;  Location: [facility];  Service: Cardiovascular;  Laterality: N/A;  ? CARDIOVERSION N/A 04/25/2021  ? Procedure: CARDIOVERSION;  Surgeon: Adrian Prows, MD;  Location: [facility];  Service: Cardiovascular;  Laterality: N/A;  ? LEG SURGERY Left 2000  ? DUE TO BROKE LEG  ? RIGHT HEART CATH N/A 12/14/2020  ? Procedure: RIGHT HEART CATH;  Surgeon: Jolaine Artist, MD;  Location: [facility];  Service: Cardiovascular;  Laterality: N/A;  ? ?Family History  ?Problem Relation Age of Onset  ? Hypertension Mother 59  ? Thyroid disease Mother   ? Healthy Father 46  ? ?Social History  ? ?Tobacco Use  ? Smoking status: Never  ? Smokeless tobacco: Never  ?Substance Use Topics  ? Alcohol use: No  ? Marital Status: Divorced ? ?ROS:  ? ?Review of Systems  ?  Constitutional: Positive for malaise/fatigue.  ?Cardiovascular:  Positive for dyspnea on exertion, leg swelling and orthopnea. Negative for chest pain and paroxysmal nocturnal dyspnea.  ? ?Objective:  ?Blood pressure 90/68, pulse 80, temperature 98.2 ?F (36.8 ?C), temperature source Temporal, resp. rate 17, height _0  (1.778 m), weight 156 lb 3.2 oz (70.9 kg), SpO2 96 %. Body mass index is 22.41 kg/m?.  ? ?  05/16/2021  ?  3:05 PM 05/16/2021  ?  2:48 PM 05/08/2021  ?  2:53 PM  ?Vitals with BMI   ?Height  _1    ?Weight  156 lbs 3 oz   ?BMI  22.41   ?Systolic 90 83 794  ?Diastolic 68 60 66  ?Pulse 80 82 75  ?   ?Physical Exam ?Vitals reviewed.  ?Constitutional:   ?   Appearance: He is well-developed.  ?Neck:  ?   Vascular: No carotid bruit or JVD.  ?Cardiovascular:  ?   Rate and Rhythm: Normal rate and regular rhythm.  ?   Pulses: Normal pulses and intact distal pulses.  ?   Heart sounds: S1 normal and S2 normal. Murmur heard.  ?High-pitched blowing holosystolic murmur is present with a grade of 3/6 at the apex.  ?  No gallop.  ?Pulmonary:  ?   Effort: Pulmonary effort is normal. No accessory muscle usage.  ?   Breath sounds: Normal breath sounds.  ?   Comments: Dyspneic on exam while speaking. ?Abdominal:  ?   General: Abdomen is flat.  ?   Palpations: Abdomen is soft.  ?Musculoskeletal:  ?   Right lower leg: Edema (1+ to knee) present.  ?   Left lower leg: Edema (1+ to knee) present.  ? ?Laboratory examination:  ? ? ?  Latest Ref Rng & Units 04/28/2021  ?  5:56 AM 04/27/2021  ?  6:20 AM 04/26/2021  ?  5:52 AM  ?CMP  ?Glucose 70 - 99 mg/dL 124   113   138    ?BUN 8 - 23 mg/dL 90   82   85    ?Creatinine 0.61 - 1.24 mg/dL 2.26   1.97   2.45    ?Sodium 135 - 145 mmol/L 131   133   132    ?Potassium 3.5 - 5.1 mmol/L 3.4   3.5   4.4    ?Chloride 98 - 111 mmol/L 92   93   92    ?CO2 22 - 32 mmol/L _2 ?Calcium 8.9 - 10.3 mg/dL 8.4   8.7   8.7    ? ? ?  Latest Ref Rng & Units 04/25/2021  ?  3:27 AM 04/24/2021  ?  7:36 AM 04/20/2021  ? 11:55 AM  ?CBC  ?WBC 4.0 - 10.5 K/uL 8.7   9.6   7.7    ?Hemoglobin 13.0 - 17.0 g/dL 13.8   14.0   14.0    ?Hematocrit 39.0 - 52.0 % 40.8   42.8   42.2    ?Platelets 150 - 400 K/uL 183   176   142    ? ?Lipid Panel  ?   ?Component Value Date/Time  ? CHOL 179 03/20/2019 0804  ? TRIG 61 03/20/2019 0804  ? HDL 51 03/20/2019 0804  ? [place] 116 (H) 03/20/2019 0804  ? ?HEMOGLOBIN A1C ?No results found for: HGBA1C, MPG ?TSH ?Recent Labs  ?  12/06/20 ?1142  ?TSH 4.190  ?  ?BNP  (last 3 results) ?  Recent Labs  ?  04/18/21 ?1428 04/20/21 ?1155 04/24/21 ?9242  ?BNP 1,882.3* 1,925.8* 1,094.6*  ? ?External labs: ?05/12/2021: ?Hb 13.1/HCT 40.1, platelets 109, normal indicis. ?BUN 93, creatinine 2.20, EGFR 30 mL, sodium 132, potassium 3.3, magnesium 2.9. ? ?05/03/2021: ?Sodium 136, potassium 5.0, BUN 68, creatinine 1.92, EGFR 36 mL, serum glucose 82 mg. ? ? ?Current Outpatient Medications:  ?  amiodarone (PACERONE) 200 MG tablet, Take 1 tablet (200 mg total) by mouth daily., Disp: 30 tablet, Rfl: 0 ?  apixaban (ELIQUIS) 5 MG TABS tablet, Take 1 tablet (5 mg total) by mouth 2 (two) times daily., Disp: 60 tablet, Rfl: 0 ?  magnesium oxide (MAG-OX) 400 MG tablet, Take 1 tablet (400 mg total) by mouth 2 (two) times daily., Disp: 60 tablet, Rfl: 0 ?  metolazone (ZAROXOLYN) 2.5 MG tablet, TAKE 1 TABLET BY MOUTH DAILY AS NEEDED FOR WORSENING SHORTNESS OF BREATHE/ LEG SWELLING, Disp: 30 tablet, Rfl: 0 ?  pantoprazole (PROTONIX) 40 MG tablet, Take 1 tablet (40 mg total) by mouth at bedtime. (Patient taking differently: Take 40 mg by mouth daily as needed.), Disp: 30 tablet, Rfl: 0 ?  polyethylene glycol powder (GLYCOLAX/MIRALAX) 17 GM/SCOOP powder, Take 17 g by mouth 2 (two) times daily., Disp: 238 g, Rfl: 0 ?  torsemide (DEMADEX) 20 MG tablet, Take 2 tablets (40 mg total) by mouth 2 (two) times daily., Disp: 120 tablet, Rfl: 0 ?  DOBUTamine (DOBUTREX) 4-5 MG/ML-% infusion, Inject 355 mcg/min into the vein continuous., Disp: 250 mL, Rfl: 12 ?  potassium chloride SA (KLOR-CON M) 20 MEQ tablet, Take 4 tablets (80 mEq total) by mouth daily., Disp: 90 tablet, Rfl: 6  ?Radiology:  ? ?No results found. ? ?Cardiac Studies:  ? ?Exercise sestamibi stress test 08/27/2014: ?1. The resting electrocardiogram demonstrated normal sinus rhythm, incomplete LBBB and no resting arrhythmias.  ST segment depression and T-wave inversion in inferior and lateral leads. Stress EKG is negative for ischemia. The patient performed  treadmill exercise using a Bruce protocol, completing 6:26 minutes. Thre were occasional PVCs. The patient completed an estimated workload of 7.65 METS, 95% of the maximum predicted heart rate. The stress t

## 2021-05-17 ENCOUNTER — Other Ambulatory Visit: Payer: Self-pay

## 2021-05-17 ENCOUNTER — Encounter (HOSPITAL_COMMUNITY): Payer: Medicare Other

## 2021-05-17 ENCOUNTER — Telehealth: Payer: Self-pay

## 2021-05-17 MED ORDER — DOBUTAMINE IN D5W 4-5 MG/ML-% IV SOLN: 5.0000 ug/kg/min | INTRAVENOUS | 12 refills | Status: DC

## 2021-05-17 NOTE — Telephone Encounter (Signed)
Pt is seeing [facility]. ?Phone: [phone] ?Fax: [phone] ?

## 2021-05-19 ENCOUNTER — Other Ambulatory Visit (HOSPITAL_COMMUNITY): Payer: Self-pay | Admitting: *Deleted

## 2021-05-19 ENCOUNTER — Encounter: Payer: Self-pay | Admitting: Internal Medicine

## 2021-05-19 ENCOUNTER — Other Ambulatory Visit (HOSPITAL_COMMUNITY): Payer: Self-pay | Admitting: Internal Medicine

## 2021-05-19 ENCOUNTER — Ambulatory Visit (HOSPITAL_COMMUNITY)
Admission: RE | Admit: 2021-05-19 | Discharge: 2021-05-19 | Disposition: A | Discharge status: Home / Self Care | Payer: Medicare Other | Source: Ambulatory Visit | Attending: Internal Medicine | Admitting: Internal Medicine

## 2021-05-19 ENCOUNTER — Encounter (HOSPITAL_COMMUNITY): Payer: Medicare Other

## 2021-05-19 ENCOUNTER — Encounter (HOSPITAL_COMMUNITY): Payer: Self-pay | Admitting: Internal Medicine

## 2021-05-19 VITALS — BP 90/50 | HR 68 | Wt 154.4 lb

## 2021-05-19 DIAGNOSIS — I5023 Acute on chronic systolic (congestive) heart failure: Secondary | ICD-10-CM | POA: Diagnosis not present

## 2021-05-19 DIAGNOSIS — I5042 Chronic combined systolic (congestive) and diastolic (congestive) heart failure: Secondary | ICD-10-CM | POA: Diagnosis not present

## 2021-05-19 DIAGNOSIS — E876 Hypokalemia: Secondary | ICD-10-CM | POA: Diagnosis not present

## 2021-05-19 DIAGNOSIS — Z7901 Long term (current) use of anticoagulants: Secondary | ICD-10-CM | POA: Diagnosis not present

## 2021-05-19 DIAGNOSIS — N184 Chronic kidney disease, stage 4 (severe): Secondary | ICD-10-CM | POA: Diagnosis not present

## 2021-05-19 DIAGNOSIS — I4819 Other persistent atrial fibrillation: Secondary | ICD-10-CM | POA: Diagnosis not present

## 2021-05-19 DIAGNOSIS — I480 Paroxysmal atrial fibrillation: Secondary | ICD-10-CM | POA: Insufficient documentation

## 2021-05-19 DIAGNOSIS — R0602 Shortness of breath: Secondary | ICD-10-CM | POA: Insufficient documentation

## 2021-05-19 LAB — BASIC METABOLIC PANEL
Anion gap: 18 — ABNORMAL HIGH (ref 5–15)
BUN: 120 mg/dL — ABNORMAL HIGH (ref 8–23)
CO2: 21 mmol/L — ABNORMAL LOW (ref 22–32)
Calcium: 9.1 mg/dL (ref 8.9–10.3)
Chloride: 91 mmol/L — ABNORMAL LOW (ref 98–111)
Creatinine, Ser: 2.83 mg/dL — ABNORMAL HIGH (ref 0.61–1.24)
GFR, Estimated: 23 mL/min — ABNORMAL LOW (ref >60)
Glucose, Bld: 101 mg/dL — ABNORMAL HIGH (ref 70–99)
Potassium: 4.5 mmol/L (ref 3.5–5.1)
Sodium: 130 mmol/L — ABNORMAL LOW (ref 135–145)

## 2021-05-19 NOTE — Addendum Note (Signed)
Encounter addended by: Scarlette Calico, RN on: 05/19/2021 11:37 AM  Actions taken: Clinical Note Signed

## 2021-05-19 NOTE — Progress Notes (Signed)
ADVANCED HF CLINIC NOTE  Referring Physician: Dr. Einar Gip  Primary Care: Nickola Major, MD Primary Cardiologist: Adrian Prows EP: Caryl Comes HF: Dr. Haroldine Laws  HPI:  Miguel Patterson  is a 76 y.o. male from [place] with chronic systolic HF and WEX9B (Creatinine 1.5-1.9) referred by Dr. Einar Gip to discuss candidacy for advanced HF therapies.   Miguel Patterson has a fairly limited PMHx. No previous h/o HTN or known CAD.   He first developed HF symptoms in 2016 and saw his PCP who referred him to Dr. Woody Seller and then Dr. Einar Gip.   He was referred to Dr. Caryl Comes in 2019 for ICD consideration. According to Dr. Olin Pia note from 10/14/17 initial echo in 2016 had EF 11% with anterior thinning. There were also frequent PVCs (~25% by ECG). At that time he did not have LBBB (IVCD). The question was raised about the possibility of [place] disease or a genetic component. (he is 1 of 17 children and several family members on heart medications). He was referred for a cMRI.    Echo 8/19 read as EF < 15% mod AI/MR (I do not have images to review).   cMRI on 11/19 with severely dilated LV with normal wall thickness. 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 Underwent BIV ICD CRT insertion on 02/2018. He does not recall ever having a cardiac cath.  We saw him in 11/21 and referred for genetic evaluation for LVNC. He was evaluated by Dr. 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  Echo 03/22 EF < 20% severe MR moderate to severe TR with Dr. Einar Gip  In 12/22 was feeling much worse. Underwent RHC. -> diuretics increased. Started mexilitene for PVC suppression to try and promote more biv pacing RA = 5 RV = 52/6 PA = 57/22 (36) PCW = 25 (v wave 30) Fick cardiac output/index = 4.0/2.1 PVR = 2.1 WU Ao sat =  99% PA sat = 62%, 64%  Echo 1/23 EF 25-30% Severe MR. Mod AI/TR.   Recently admitted with end-stage HF. Now on palliative DBA. Felt better for a weak but now feeling much worse. Saw Dr. Einar Gip about a week ago and dobutamine increased to 5.5 mcg/kg/minand metolazone added. SOB with any activity. Anorectic. Not sleeping.     CPX 11/21  FVC 3.17 (71%)      FEV1 2.33 (70%)        FEV1/FVC 74 (97%)        MVV 94 (72%)        BP rest: 86/58 Standing BP: 92/60 BP peak: 122/60   Peak VO2: 26.9 (103% predicted peak VO2)  VE/VCO2 slope:  35  OUES: 2.17  Peak RER: 1.11   Ventilatory Threshold: 15.4 (59% predicted or measured peak VO2)   VE/MVV:  93%  O2pulse:  14   (100% predicted O2pulse)     Past Medical History:  Diagnosis Date   AICD (automatic cardioverter/defibrillator) present    Cardiomegaly    CHF (congestive heart failure) ([facility])    Chronic combined systolic and diastolic heart failure ([place]) 02/10/2018   DCM (dilated cardiomyopathy) ([place])    Encounter for adjustment of biventricular implantable cardioverter-defibrillator (ICD) 04/03/2019   Fatigue    ICD- Biventricular Medtronic ICD Claria Mri [id number] in situ 02/28/18 03/01/2018   LBBB (left bundle branch block)  Mild tricuspid regurgitation    Moderate aortic regurgitation    Moderate mitral regurgitation    Pericardial effusion    INSIGNIFICANT   S/P biventricular cardiac pacemaker procedure 02/28/18 with ICD MDT  03/01/2018   Short of breath on exertion    Sleep apnea    Systolic heart failure ([facility])    Trace pulmonic regurgitation by prior echocardiogram     Past Medical History:  Diagnosis Date   AICD (automatic cardioverter/defibrillator) present    Cardiomegaly    CHF (congestive heart failure) ([facility])    Chronic combined systolic and diastolic heart failure ([place]) 02/10/2018   DCM (dilated cardiomyopathy) ([place])    Encounter for adjustment of biventricular implantable cardioverter-defibrillator (ICD)  04/03/2019   Fatigue    ICD- Biventricular Medtronic ICD Claria Mri [id number] in situ 02/28/18 03/01/2018   LBBB (left bundle branch block)    Mild tricuspid regurgitation    Moderate aortic regurgitation    Moderate mitral regurgitation    Pericardial effusion    INSIGNIFICANT   S/P biventricular cardiac pacemaker procedure 02/28/18 with ICD MDT  03/01/2018   Short of breath on exertion    Sleep apnea    Systolic heart failure ([facility])    Trace pulmonic regurgitation by prior echocardiogram     Current Outpatient Medications  Medication Sig Dispense Refill   amiodarone (PACERONE) 200 MG tablet Take 1 tablet (200 mg total) by mouth daily. 30 tablet 0   apixaban (ELIQUIS) 5 MG TABS tablet Take 1 tablet (5 mg total) by mouth 2 (two) times daily. 60 tablet 0   DOBUTamine (DOBUTREX) 4-5 MG/ML-% infusion Inject 355 mcg/min into the vein continuous. 250 mL 12   magnesium oxide (MAG-OX) 400 MG tablet Take 1 tablet (400 mg total) by mouth 2 (two) times daily. 60 tablet 0   metolazone (ZAROXOLYN) 2.5 MG tablet Take 2.5 mg by mouth 2 (two) times daily.     polyethylene glycol powder (GLYCOLAX/MIRALAX) 17 GM/SCOOP powder Take 17 g by mouth 2 (two) times daily. 238 g 0   Potassium Chloride ER 20 MEQ TBCR Take 20 mEq by mouth in the morning, at noon, in the evening, and at bedtime.     torsemide (DEMADEX) 20 MG tablet Take 2 tablets (40 mg total) by mouth 2 (two) times daily. 120 tablet 0   No current facility-administered medications for this encounter.    Allergies  Allergen Reactions   Losartan Potassium Anaphylaxis    angioedema   Aspirin Other (See Comments)    Stomach pain       Social History   Socioeconomic History   Marital status: Divorced    Spouse name: Not on file   Number of children: 3   Years of education: 37   Highest education level: Professional school degree (e.g., MD, DDS, DVM, JD)  Occupational History   Occupation: Retired    Comment: Retired college professor  [employer]  Tobacco Use   Smoking status: Never   Smokeless tobacco: Never  Vaping Use   Vaping Use: Never used  Substance and Sexual Activity   Alcohol use: No   Drug use: No   Sexual activity: Not Currently  Other Topics Concern   Not on file  Social History Narrative   Not on file   Social Determinants of Health   Financial Resource Strain: Not on file  Food Insecurity: Not on file  Transportation Needs: Not on file  Physical Activity: Not on file  Stress: Not on file  Social Connections:  Not on file  Intimate Partner Violence: Not on file      Family History  Problem Relation Age of Onset   Hypertension Mother 39   Thyroid disease Mother    Healthy Father 49    Vitals:   05/19/21 0951  BP: (!) 90/50  Pulse: 68  SpO2: 98%  Weight: 70 kg (154 lb 6.4 oz)      PHYSICAL EXAM: General:  Thin weak appearing. No resp difficulty HEENT: normal Neck: supple. JVP 7. Carotids 2+ bilat; no bruits. No lymphadenopathy or thryomegaly appreciated. Cor: PMI nondisplaced. Irregular rate & rhythm. 3/6 MR/TR Lungs: clear Abdomen: soft, nontender, nondistended. No hepatosplenomegaly. No bruits or masses. Good bowel sounds. Extremities: no cyanosis, clubbing, rash, tr-1+ edema Neuro: alert & orientedx3, cranial nerves grossly intact. moves all 4 extremities w/o difficulty. Affect pleasant    ASSESSMENT & PLAN:  1. Acute on Chronic systolic HF due to non-compaction CM - cMRI 11/19  LVEF 14% severely dilated LV with normal wall thickness c/w end stage LVNC  - No report of cardiac cath but based on MRI (and family history), LVNC seems like it may be the culprit here. He also has a h/o LBBB but his CM predated the development of LBBB so doubt it can be implicated. Previously also has had frequent PVCs but not present today - Echo 10/21 EF 25-30% with Dr. Einar Gip  - Echo 03/22 EF < 20% with Dr. Einar Gip - s/p MDT CRT-D - CPX 11/21 pVO2 26.9 (103%) Slope 35 - RHC 12/22 RA 5 PA 57/22 (36)  PCW = 25 (v wave 30) Fick 4.0/2.1 - Echo 1/23 EF 25-30% Severe MR. Mod AI/TR. - Now end-stage/NYHA IV despite palliative DBA at 5 - Volume status mildly elevated - Continues to deteriorate suspect exacerbated by recurrent AF - Long discussion about his situation. They are in agreement with referral for home Hospice consult with [facility]. Will order. - We also discussed possibility of repeat DC-CV to ameliorate symptoms with the understanding that this woul be unlikely to hold long.  - Labs today - D/w Dr. Einar Gip   2. Valvular heart disease - Mod AI/Mod MR on cMRI - Severe MR, moderate AI and moderate TR on echo 1/23 - Not Clip candidate  3. CKD IV - labs today  4. Hypokalemia - supp as above  5. PAF - s/p DC-CV on 04/11/21 and 04/25/21 - now back in AF  -On Apixaban and amio  - plan as above  6. LBBB - s/p CRT-D  Total time spent 45 minutes. Over half that time spent discussing above.    Glori Bickers, MD  10:01 AM

## 2021-05-19 NOTE — Patient Instructions (Addendum)
Labs done today, your results will be available in MyChart, we will contact you for abnormal readings.  You have been referred to [facility], they will contact you  Your physician has recommended that you have a Cardioversion (DCCV). Electrical Cardioversion uses a jolt of electricity to your heart either through paddles or wired patches attached to your chest. This is a controlled, usually prescheduled, procedure. Defibrillation is done under light anesthesia in the hospital, and you usually go home the day of the procedure. This is done to get your heart back into a normal rhythm. You are not awake for the procedure. Please see the instruction sheet given to you today. SEE INSTRUCTIONS BELOW  Your physician recommends that you schedule a follow-up appointment in: 2 months  If you have any questions or concerns before your next appointment please send [place] a message through [place] or call our office at [phone].    TO LEAVE A MESSAGE FOR THE NURSE SELECT OPTION 2, PLEASE LEAVE A MESSAGE INCLUDING: YOUR NAME DATE OF BIRTH CALL BACK NUMBER REASON FOR CALL**this is important as we prioritize the call backs  YOU WILL RECEIVE A CALL BACK THE SAME DAY AS LONG AS YOU CALL BEFORE 4:00 PM    CARDIOVERSION INSTRUCTIONS  You are scheduled for a Cardioversion on Monday 05/22/21 with Dr. Haroldine Laws.    Please arrive at the [facility] (Main Entrance A) at [facility]: [address] [place], [place] [address] at 12:00 pm.   DIET: Nothing to eat or drink after midnight except a sip of water with medications (see medication instructions below)  Medication Instructions: Hold TORSEMIDE 5/22 AM  Continue your anticoagulant: ELIQUIS, PLEASE DO NOT MISS ANY DOSES   You must have a responsible person to drive you home and stay in the waiting area during your procedure. Failure to do so could result in cancellation.  Bring your insurance cards.  *Special Note: Every effort is  made to have your procedure done on time. Occasionally there are emergencies that occur at the hospital that may cause delays. Please be patient if a delay does occur.

## 2021-05-19 NOTE — Progress Notes (Signed)
Referral for Home Hospice called into Tim at [facility], RN, BSN, [facility]

## 2021-05-19 NOTE — H&P (View-Only) (Signed)
ADVANCED HF CLINIC NOTE  Referring Physician: Dr. Einar Gip  Primary Care: Nickola Major, MD Primary Cardiologist: Adrian Prows EP: Caryl Comes HF: Dr. Haroldine Laws  HPI:  Miguel Patterson  is a 76 y.o. male from [place] with chronic systolic HF and ZOX0R (Creatinine 1.5-1.9) referred by Dr. Einar Gip to discuss candidacy for advanced HF therapies.   Mr. Recore has a fairly limited PMHx. No previous h/o HTN or known CAD.   He first developed HF symptoms in 2016 and saw his PCP who referred him to Dr. Woody Seller and then Dr. Einar Gip.   He was referred to Dr. Caryl Comes in 2019 for ICD consideration. According to Dr. Olin Pia note from 10/14/17 initial echo in 2016 had EF 11% with anterior thinning. There were also frequent PVCs (~25% by ECG). At that time he did not have LBBB (IVCD). The question was raised about the possibility of Chagas disease or a genetic component. (he is 1 of 17 children and several family members on heart medications). He was referred for a cMRI.    Echo 8/19 read as EF < 15% mod AI/MR (I do not have images to review).   cMRI on 11/19 with severely dilated LV with normal wall thickness. 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 Underwent BIV ICD CRT insertion on 02/2018. He does not recall ever having a cardiac cath.  We saw him in 11/21 and referred for genetic evaluation for LVNC. He was evaluated by Dr. 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  Echo 03/22 EF < 20% severe MR moderate to severe TR with Dr. Einar Gip  In 12/22 was feeling much worse. Underwent RHC. -> diuretics increased. Started mexilitene for PVC suppression to try and promote more biv pacing RA = 5 RV = 52/6 PA = 57/22 (36) PCW = 25 (v wave 30) Fick cardiac output/index = 4.0/2.1 PVR = 2.1 WU Ao sat =  99% PA sat = 62%, 64%  Echo 1/23 EF 25-30% Severe MR. Mod AI/TR.   Recently admitted with end-stage HF. Now on palliative DBA. Felt better for a weak but now feeling much worse. Saw Dr. Einar Gip about a week ago and dobutamine increased to 5.5 mcg/kg/minand metolazone added. SOB with any activity. Anorectic. Not sleeping.     CPX 11/21  FVC 3.17 (71%)      FEV1 2.33 (70%)        FEV1/FVC 74 (97%)        MVV 94 (72%)        BP rest: 86/58 Standing BP: 92/60 BP peak: 122/60   Peak VO2: 26.9 (103% predicted peak VO2)  VE/VCO2 slope:  35  OUES: 2.17  Peak RER: 1.11   Ventilatory Threshold: 15.4 (59% predicted or measured peak VO2)   VE/MVV:  93%  O2pulse:  14   (100% predicted O2pulse)     Past Medical History:  Diagnosis Date   AICD (automatic cardioverter/defibrillator) present    Cardiomegaly    CHF (congestive heart failure) (HCC)    Chronic combined systolic and diastolic heart failure ([place]) 02/10/2018   DCM (dilated cardiomyopathy) ([place])    Encounter for adjustment of biventricular implantable cardioverter-defibrillator (ICD) 04/03/2019   Fatigue    ICD- Biventricular Medtronic ICD Claria Mri [id number] in situ 02/28/18 03/01/2018   LBBB (left bundle branch block)  Mild tricuspid regurgitation    Moderate aortic regurgitation    Moderate mitral regurgitation    Pericardial effusion    INSIGNIFICANT   S/P biventricular cardiac pacemaker procedure 02/28/18 with ICD MDT  03/01/2018   Short of breath on exertion    Sleep apnea    Systolic heart failure (HCC)    Trace pulmonic regurgitation by prior echocardiogram     Past Medical History:  Diagnosis Date   AICD (automatic cardioverter/defibrillator) present    Cardiomegaly    CHF (congestive heart failure) (HCC)    Chronic combined systolic and diastolic heart failure ([place]) 02/10/2018   DCM (dilated cardiomyopathy) ([place])    Encounter for adjustment of biventricular implantable cardioverter-defibrillator (ICD)  04/03/2019   Fatigue    ICD- Biventricular Medtronic ICD Claria Mri [id number] in situ 02/28/18 03/01/2018   LBBB (left bundle branch block)    Mild tricuspid regurgitation    Moderate aortic regurgitation    Moderate mitral regurgitation    Pericardial effusion    INSIGNIFICANT   S/P biventricular cardiac pacemaker procedure 02/28/18 with ICD MDT  03/01/2018   Short of breath on exertion    Sleep apnea    Systolic heart failure (HCC)    Trace pulmonic regurgitation by prior echocardiogram     Current Outpatient Medications  Medication Sig Dispense Refill   amiodarone (PACERONE) 200 MG tablet Take 1 tablet (200 mg total) by mouth daily. 30 tablet 0   apixaban (ELIQUIS) 5 MG TABS tablet Take 1 tablet (5 mg total) by mouth 2 (two) times daily. 60 tablet 0   DOBUTamine (DOBUTREX) 4-5 MG/ML-% infusion Inject 355 mcg/min into the vein continuous. 250 mL 12   magnesium oxide (MAG-OX) 400 MG tablet Take 1 tablet (400 mg total) by mouth 2 (two) times daily. 60 tablet 0   metolazone (ZAROXOLYN) 2.5 MG tablet Take 2.5 mg by mouth 2 (two) times daily.     polyethylene glycol powder (GLYCOLAX/MIRALAX) 17 GM/SCOOP powder Take 17 g by mouth 2 (two) times daily. 238 g 0   Potassium Chloride ER 20 MEQ TBCR Take 20 mEq by mouth in the morning, at noon, in the evening, and at bedtime.     torsemide (DEMADEX) 20 MG tablet Take 2 tablets (40 mg total) by mouth 2 (two) times daily. 120 tablet 0   No current facility-administered medications for this encounter.    Allergies  Allergen Reactions   Losartan Potassium Anaphylaxis    angioedema   Aspirin Other (See Comments)    Stomach pain       Social History   Socioeconomic History   Marital status: Divorced    Spouse name: Not on file   Number of children: 3   Years of education: 28   Highest education level: Professional school degree (e.g., MD, DDS, DVM, JD)  Occupational History   Occupation: Retired    Comment: Retired college professor  [employer]  Tobacco Use   Smoking status: Never   Smokeless tobacco: Never  Vaping Use   Vaping Use: Never used  Substance and Sexual Activity   Alcohol use: No   Drug use: No   Sexual activity: Not Currently  Other Topics Concern   Not on file  Social History Narrative   Not on file   Social Determinants of Health   Financial Resource Strain: Not on file  Food Insecurity: Not on file  Transportation Needs: Not on file  Physical Activity: Not on file  Stress: Not on file  Social Connections:  Not on file  Intimate Partner Violence: Not on file      Family History  Problem Relation Age of Onset   Hypertension Mother 24   Thyroid disease Mother    Healthy Father 93    Vitals:   05/19/21 0951  BP: (!) 90/50  Pulse: 68  SpO2: 98%  Weight: 70 kg (154 lb 6.4 oz)      PHYSICAL EXAM: General:  Thin weak appearing. No resp difficulty HEENT: normal Neck: supple. JVP 7. Carotids 2+ bilat; no bruits. No lymphadenopathy or thryomegaly appreciated. Cor: PMI nondisplaced. Irregular rate & rhythm. 3/6 MR/TR Lungs: clear Abdomen: soft, nontender, nondistended. No hepatosplenomegaly. No bruits or masses. Good bowel sounds. Extremities: no cyanosis, clubbing, rash, tr-1+ edema Neuro: alert & orientedx3, cranial nerves grossly intact. moves all 4 extremities w/o difficulty. Affect pleasant    ASSESSMENT & PLAN:  1. Acute on Chronic systolic HF due to non-compaction CM - cMRI 11/19  LVEF 14% severely dilated LV with normal wall thickness c/w end stage LVNC  - No report of cardiac cath but based on MRI (and family history), LVNC seems like it may be the culprit here. He also has a h/o LBBB but his CM predated the development of LBBB so doubt it can be implicated. Previously also has had frequent PVCs but not present today - Echo 10/21 EF 25-30% with Dr. Einar Gip  - Echo 03/22 EF < 20% with Dr. Einar Gip - s/p MDT CRT-D - CPX 11/21 pVO2 26.9 (103%) Slope 35 - RHC 12/22 RA 5 PA 57/22 (36)  PCW = 25 (v wave 30) Fick 4.0/2.1 - Echo 1/23 EF 25-30% Severe MR. Mod AI/TR. - Now end-stage/NYHA IV despite palliative DBA at 5 - Volume status mildly elevated - Continues to deteriorate suspect exacerbated by recurrent AF - Long discussion about his situation. They are in agreement with referral for home Hospice consult with [facility]. Will order. - We also discussed possibility of repeat DC-CV to ameliorate symptoms with the understanding that this woul be unlikely to hold long.  - Labs today - D/w Dr. Einar Gip   2. Valvular heart disease - Mod AI/Mod MR on cMRI - Severe MR, moderate AI and moderate TR on echo 1/23 - Not Clip candidate  3. CKD IV - labs today  4. Hypokalemia - supp as above  5. PAF - s/p DC-CV on 04/11/21 and 04/25/21 - now back in AF  -On Apixaban and amio  - plan as above  6. LBBB - s/p CRT-D  Total time spent 45 minutes. Over half that time spent discussing above.    Glori Bickers, MD  10:01 AM

## 2021-05-19 NOTE — Addendum Note (Signed)
Encounter addended by: Scarlette Calico, RN on: 05/19/2021 11:59 AM  Actions taken: Clinical Note Signed, Order list changed, Diagnosis association updated

## 2021-05-19 NOTE — Addendum Note (Signed)
Encounter addended by: Scarlette Calico, RN on: 05/19/2021 11:18 AM  Actions taken: Order list changed, Diagnosis association updated, Clinical Note Signed, Charge Capture section accepted

## 2021-05-22 ENCOUNTER — Encounter (HOSPITAL_COMMUNITY): Payer: Self-pay | Admitting: Cardiology

## 2021-05-22 ENCOUNTER — Ambulatory Visit (HOSPITAL_COMMUNITY): Payer: Medicare Other | Admitting: Anesthesiology

## 2021-05-22 ENCOUNTER — Ambulatory Visit (HOSPITAL_COMMUNITY)
Admission: RE | Admit: 2021-05-22 | Discharge: 2021-05-22 | Disposition: A | Discharge status: Home / Self Care | Payer: Medicare Other | Attending: Cardiology | Admitting: Cardiology

## 2021-05-22 ENCOUNTER — Encounter (HOSPITAL_COMMUNITY)
Admission: RE | Disposition: A | Discharge status: Home / Self Care | Payer: Self-pay | Source: Home / Self Care | Attending: Cardiology

## 2021-05-22 ENCOUNTER — Telehealth: Payer: Self-pay | Admitting: Cardiology

## 2021-05-22 ENCOUNTER — Encounter (HOSPITAL_COMMUNITY): Payer: Medicare Other

## 2021-05-22 ENCOUNTER — Ambulatory Visit (HOSPITAL_BASED_OUTPATIENT_CLINIC_OR_DEPARTMENT_OTHER): Payer: Medicare Other | Admitting: Anesthesiology

## 2021-05-22 DIAGNOSIS — Z95810 Presence of automatic (implantable) cardiac defibrillator: Secondary | ICD-10-CM | POA: Diagnosis not present

## 2021-05-22 DIAGNOSIS — I509 Heart failure, unspecified: Secondary | ICD-10-CM | POA: Diagnosis not present

## 2021-05-22 DIAGNOSIS — I4891 Unspecified atrial fibrillation: Secondary | ICD-10-CM | POA: Insufficient documentation

## 2021-05-22 DIAGNOSIS — I5023 Acute on chronic systolic (congestive) heart failure: Secondary | ICD-10-CM

## 2021-05-22 DIAGNOSIS — G4730 Sleep apnea, unspecified: Secondary | ICD-10-CM | POA: Diagnosis not present

## 2021-05-22 DIAGNOSIS — N1832 Chronic kidney disease, stage 3b: Secondary | ICD-10-CM | POA: Insufficient documentation

## 2021-05-22 DIAGNOSIS — Z5181 Encounter for therapeutic drug level monitoring: Secondary | ICD-10-CM

## 2021-05-22 DIAGNOSIS — N184 Chronic kidney disease, stage 4 (severe): Secondary | ICD-10-CM

## 2021-05-22 DIAGNOSIS — N179 Acute kidney failure, unspecified: Secondary | ICD-10-CM

## 2021-05-22 DIAGNOSIS — I083 Combined rheumatic disorders of mitral, aortic and tricuspid valves: Secondary | ICD-10-CM | POA: Insufficient documentation

## 2021-05-22 HISTORY — PX: CARDIOVERSION: SHX1299

## 2021-05-22 SURGERY — CARDIOVERSION
Anesthesia: General

## 2021-05-22 MED ORDER — LIDOCAINE HCL (CARDIAC) PF 100 MG/5ML IV SOSY
PREFILLED_SYRINGE | INTRAVENOUS | Status: DC | PRN
  Administered 2021-05-22: 80 mg via INTRAVENOUS

## 2021-05-22 MED ORDER — ETOMIDATE 2 MG/ML IV SOLN
INTRAVENOUS | Status: DC | PRN
  Administered 2021-05-22: 10 mg via INTRAVENOUS

## 2021-05-22 MED ORDER — SODIUM CHLORIDE 0.9 % IV SOLN: INTRAVENOUS | Status: DC | PRN

## 2021-05-22 MED ORDER — PHENYLEPHRINE HCL (PRESSORS) 10 MG/ML IV SOLN
INTRAVENOUS | Status: DC | PRN
  Administered 2021-05-22: 160 ug via INTRAVENOUS
  Administered 2021-05-22: 80 ug via INTRAVENOUS

## 2021-05-22 MED ORDER — PROPOFOL 10 MG/ML IV BOLUS
INTRAVENOUS | Status: DC | PRN
  Administered 2021-05-22: 10 mg via INTRAVENOUS

## 2021-05-22 NOTE — Interval H&P Note (Signed)
History and Physical Interval Note:  0/03/7046 88:91 PM  Miguel Patterson  has presented today for surgery, with the diagnosis of afib.  The various methods of treatment have been discussed with the patient and family. After consideration of risks, benefits and other options for treatment, the patient has consented to  Procedure(s): CARDIOVERSION (N/A) as a surgical intervention.  The patient's history has been reviewed, patient examined, no change in status, stable for surgery.  I have reviewed the patient's chart and labs.  Questions were answered to the patient's satisfaction.     [name]

## 2021-05-22 NOTE — Anesthesia Preprocedure Evaluation (Addendum)
Anesthesia Evaluation  Patient identified by MRN, date of birth, ID band Patient awake    Reviewed: Allergy & Precautions, NPO status , Patient's Chart, lab work & pertinent test results  History of Anesthesia Complications Negative for: history of anesthetic complications  Airway Mallampati: II  TM Distance: >3 FB Neck ROM: Full    Dental  (+) Dental Advisory Given, Teeth Intact   Pulmonary shortness of breath, sleep apnea ,     + decreased breath sounds      Cardiovascular Exercise Tolerance: Poor +CHF  + dysrhythmias Atrial Fibrillation + Cardiac Defibrillator + Valvular Problems/Murmurs  Rhythm:Irregular Rate:Abnormal  Ventricular-paced rhythm with occasional AV dual-paced complexes Abnormal ECG Confirmed by Lauree Chandler [phone]) on 05/19/2021 12:46:01 PM   1. Left ventricular ejection fraction, by estimation, is <20%. The left  ventricle has severely decreased function. The left ventricle demonstrates  global hypokinesis. The left ventricular internal cavity size was dilated  (LVIDd 7.4cm). Left ventricular  diastolic parameters are consistent with Grade III diastolic dysfunction  (restrictive). Elevated left atrial pressure. The E/e' is 19.3.  2. Right ventricular systolic function is mildly reduced. The right  ventricular size is normal. There is moderately elevated pulmonary artery  systolic pressure. The estimated right ventricular systolic pressure is  12.4 mmHg.  3. Left atrial size was severely dilated.  4. Right atrial size was mild to moderately dilated.  5. Moderate pericardial effusion. The pericardial effusion is  circumferential, mostly posterior.  6. The mitral valve is grossly normal. Severe mitral valve regurgitation.  No evidence of mitral stenosis.  7. Tricuspid valve regurgitation is moderate to severe.  8. The aortic valve is tricuspid. Aortic valve regurgitation is mild.  Aortic  valve sclerosis is present, with no evidence of aortic valve    Neuro/Psych negative neurological ROS  negative psych ROS   GI/Hepatic negative GI ROS, Neg liver ROS,   Endo/Other  negative endocrine ROS  Renal/GU ARF and CRFRenal diseasehyponatremia     Musculoskeletal negative musculoskeletal ROS (+)   Abdominal   Peds negative pediatric ROS (+)  Hematology  (+) Blood dyscrasia, ,   Anesthesia Other Findings On dobutamine gtt at home via PICC  Reproductive/Obstetrics                            Anesthesia Physical Anesthesia Plan  ASA: 4  Anesthesia Plan: General   Post-op Pain Management: Minimal or no pain anticipated   Induction: Intravenous  PONV Risk Score and Plan: 1 and Propofol infusion and Treatment may vary due to age or medical condition  Airway Management Planned: Mask  Additional Equipment: None  Intra-op Plan:   Post-operative Plan:   Informed Consent: I have reviewed the patients History and Physical, chart, labs and discussed the procedure including the risks, benefits and alternatives for the proposed anesthesia with the patient or authorized representative who has indicated his/her understanding and acceptance.   Patient has DNR.  Discussed DNR with patient and Suspend DNR.   Dental advisory given  Plan Discussed with: Anesthesiologist, CRNA and Surgeon  Anesthesia Plan Comments: (On palliative dobutamine per cardiology notes. Norton Blizzard, MD  )      Anesthesia Quick Evaluation

## 2021-05-22 NOTE — Anesthesia Postprocedure Evaluation (Signed)
Anesthesia Post Note  Patient: Miguel Patterson  Procedure(s) Performed: CARDIOVERSION     Patient location during evaluation: PACU Anesthesia Type: General Level of consciousness: awake Pain management: pain level controlled Vital Signs Assessment: post-procedure vital signs reviewed and stable Respiratory status: spontaneous breathing and respiratory function stable Cardiovascular status: stable Postop Assessment: no apparent nausea or vomiting Anesthetic complications: no   No notable events documented.  Last Vitals:  Vitals:   05/22/21 1312 05/22/21 1326  BP: 105/67 (!) 100/59  Pulse: 69 69  Resp: 15 13  Temp:    SpO2: 92% 100%    Last Pain:  Vitals:   05/22/21 1312  TempSrc:   PainSc: 0-No pain                 Merlinda Frederick

## 2021-05-22 NOTE — Telephone Encounter (Signed)
ICD-10-CM   1. Acute on chronic systolic heart failure (HCC)  I50.23     2. Encounter for therapeutic drug monitoring  Z51.81     3. CKD (chronic kidney disease) stage 4, GFR 15-29 ml/min (HCC)  N18.4      I have reviewed the home health plan of care, signed off on the orders.

## 2021-05-22 NOTE — CV Procedure (Signed)
    DIRECT CURRENT CARDIOVERSION  NAME:  Miguel Patterson   MRN: [id number] DOB:  11/06/1945   ADMIT DATE: 05/22/2021   INDICATIONS: Atrial fibrillation    PROCEDURE:   Informed consent was obtained prior to the procedure. The risks, benefits and alternatives for the procedure were discussed and the patient comprehended these risks. Once an appropriate time out was taken, the patient had the defibrillator pads placed in the anterior and posterior position. The patient then underwent sedation by the anesthesia service. Once an appropriate level of sedation was achieved, the patient received a single biphasic, synchronized 150J shock with prompt conversion to sinus rhythm. No apparent complications.  Glori Bickers, MD  12:59 PM

## 2021-05-22 NOTE — Transfer of Care (Signed)
Immediate Anesthesia Transfer of Care Note  Patient: Miguel Patterson  Procedure(s) Performed: CARDIOVERSION  Patient Location: PACU  Anesthesia Type:General  Level of Consciousness: drowsy  Airway & Oxygen Therapy: Patient Spontanous Breathing  Post-op Assessment: Report given to RN and Post -op Vital signs reviewed and stable  Post vital signs: Reviewed and stable  Last Vitals:  Vitals Value Taken Time  BP    Temp    Pulse    Resp    SpO2      Last Pain:  Vitals:   05/22/21 1227  TempSrc: Temporal  PainSc: 0-No pain         Complications: No notable events documented.

## 2021-05-23 ENCOUNTER — Encounter (HOSPITAL_COMMUNITY): Payer: Self-pay | Admitting: Cardiology

## 2021-05-24 ENCOUNTER — Encounter (HOSPITAL_COMMUNITY): Payer: Medicare Other

## 2021-05-25 ENCOUNTER — Encounter: Payer: Self-pay | Admitting: Cardiology

## 2021-05-25 ENCOUNTER — Telehealth: Payer: Self-pay

## 2021-05-25 NOTE — Telephone Encounter (Signed)
Patient called to confirm Dr. Einar Gip received his MyChart message. I advised him we will return his call before the end of the day.

## 2021-05-25 NOTE — Telephone Encounter (Signed)
From patient.

## 2021-05-26 ENCOUNTER — Encounter (HOSPITAL_COMMUNITY): Payer: Medicare Other

## 2021-06-02 ENCOUNTER — Ambulatory Visit (HOSPITAL_COMMUNITY)
Admission: RE | Admit: 2021-06-02 | Discharge: 2021-06-02 | Disposition: A | Discharge status: Home / Self Care | Payer: Medicare Other | Source: Ambulatory Visit | Attending: Cardiology | Admitting: Cardiology

## 2021-06-02 DIAGNOSIS — Z48812 Encounter for surgical aftercare following surgery on the circulatory system: Secondary | ICD-10-CM | POA: Insufficient documentation

## 2021-06-02 DIAGNOSIS — I5042 Chronic combined systolic (congestive) and diastolic (congestive) heart failure: Secondary | ICD-10-CM

## 2021-06-05 ENCOUNTER — Ambulatory Visit: Payer: Medicare Other | Admitting: Cardiology

## 2021-06-05 ENCOUNTER — Encounter: Payer: Self-pay | Admitting: Cardiology

## 2021-06-05 VITALS — BP 91/61 | HR 88 | Temp 98.0°F | Resp 18 | Ht 70.0 in | Wt 162.0 lb

## 2021-06-05 DIAGNOSIS — I428 Other cardiomyopathies: Secondary | ICD-10-CM

## 2021-06-05 DIAGNOSIS — I5023 Acute on chronic systolic (congestive) heart failure: Secondary | ICD-10-CM

## 2021-06-05 DIAGNOSIS — I4819 Other persistent atrial fibrillation: Secondary | ICD-10-CM

## 2021-06-05 NOTE — Progress Notes (Signed)
Primary Physician:  Nickola Major, MD   Patient ID: Miguel Patterson, male    DOB: 1945/07/18, 76 y.o.   MRN: [id number]   Chief Complaint  Patient presents with   Congestive Heart Failure   Follow-up   HPI    Miguel Patterson  is a 76 y.o. male  Miguel Patterson  is a 76 y.o. from [place] with severe non ischemic dilated cardiomyopathy  with severely depressed left-ventricular systolic function, now end stage non-compaction cardiomyopathy, BIV ICD CRT insertion on 02/28/2018.   Presently in Palliative care and on outpatient dobutamine infusion, has been transitioned to hospice care and hospice will be taken over his care in the next few days.  He is feeling the best he has in quite a while, has not had any significant dyspnea, further orthopnea.   He is presently on 5 mcg/kg/min of dobutamine, taking Zaroxolyn 2.5 mg p.o. daily as well as torsemide 40 mg p.o. twice daily.  ROS:   Review of Systems  Constitutional: Positive for malaise/fatigue.  Cardiovascular:  Positive for dyspnea on exertion, leg swelling and orthopnea. Negative for chest pain and paroxysmal nocturnal dyspnea.   Objective:  Blood pressure 91/61, pulse 88, temperature 98 F (36.7 C), temperature source Temporal, resp. rate 18, height 5' 10" (1.778 m), weight 162 lb (73.5 kg), SpO2 93 %. Body mass index is 23.24 kg/m.     06/05/2021    1:52 PM 05/22/2021    1:26 PM 05/22/2021    1:12 PM  Vitals with BMI  Height 5' 10"    Weight 162 lbs    BMI 37.62    Systolic 91 831 517  Diastolic 61 59 67  Pulse 88 69 69     Physical Exam Vitals reviewed.  Constitutional:      Appearance: He is well-developed.  Neck:     Vascular: No carotid bruit or JVD.  Cardiovascular:     Rate and Rhythm: Normal rate and regular rhythm.     Pulses: Normal pulses and intact distal pulses.     Heart sounds: S1 normal and S2 normal. Murmur heard.  High-pitched blowing holosystolic murmur is present with a grade of  3/6 at the apex.    No gallop.  Pulmonary:     Effort: Pulmonary effort is normal. No accessory muscle usage.     Breath sounds: Normal breath sounds.  Abdominal:     General: Abdomen is flat.     Palpations: Abdomen is soft.  Musculoskeletal:     Right lower leg: Edema (2+ to knee) present.     Left lower leg: Edema (2+ to knee) present.   Laboratory examination:      Latest Ref Rng & Units 05/19/2021   11:17 AM 04/28/2021    5:56 AM 04/27/2021    6:20 AM  CMP  Glucose 70 - 99 mg/dL 101   124   113    BUN 8 - 23 mg/dL 120   90   82    Creatinine 0.61 - 1.24 mg/dL 2.83   2.26   1.97    Sodium 135 - 145 mmol/L 130   131   133    Potassium 3.5 - 5.1 mmol/L 4.5   3.4   3.5    Chloride 98 - 111 mmol/L 91   92   93    CO2 22 - 32 mmol/L _0 Calcium 8.9 - 10.3 mg/dL 9.1  8.4   8.7        Latest Ref Rng & Units 04/25/2021    3:27 AM 04/24/2021    7:36 AM 04/20/2021   11:55 AM  CBC  WBC 4.0 - 10.5 K/uL 8.7   9.6   7.7    Hemoglobin 13.0 - 17.0 g/dL 13.8   14.0   14.0    Hematocrit 39.0 - 52.0 % 40.8   42.8   42.2    Platelets 150 - 400 K/uL 183   176   142     Lipid Panel     Component Value Date/Time   CHOL 179 03/20/2019 0804   TRIG 61 03/20/2019 0804   HDL 51 03/20/2019 0804   LDLCALC 116 (H) 03/20/2019 0804   HEMOGLOBIN A1C No results found for: HGBA1C, MPG TSH Recent Labs    12/06/20 1142  TSH 4.190    BNP (last 3 results) Recent Labs    04/18/21 1428 04/20/21 1155 04/24/21 0517  BNP 1,882.3* 1,925.8* 1,094.6*   External labs: 05/12/2021: Hb 13.1/HCT 40.1, platelets 109, normal indicis. BUN 93, creatinine 2.20, EGFR 30 mL, sodium 132, potassium 3.3, magnesium 2.9.  05/03/2021: Sodium 136, potassium 5.0, BUN 68, creatinine 1.92, EGFR 36 mL, serum glucose 82 mg.   Current Outpatient Medications:    amiodarone (PACERONE) 200 MG tablet, Take 1 tablet (200 mg total) by mouth daily., Disp: 30 tablet, Rfl: 0   apixaban (ELIQUIS) 5 MG TABS tablet,  Take 1 tablet (5 mg total) by mouth 2 (two) times daily., Disp: 60 tablet, Rfl: 0   DOBUTamine (DOBUTREX) 4-5 MG/ML-% infusion, Inject 355 mcg/min into the vein continuous., Disp: 250 mL, Rfl: 12   magnesium oxide (MAG-OX) 400 MG tablet, Take 1 tablet (400 mg total) by mouth 2 (two) times daily., Disp: 60 tablet, Rfl: 0   metolazone (ZAROXOLYN) 2.5 MG tablet, Take 2.5 mg by mouth daily., Disp: , Rfl:    polyethylene glycol powder (GLYCOLAX/MIRALAX) 17 GM/SCOOP powder, Take 17 g by mouth 2 (two) times daily., Disp: 238 g, Rfl: 0   Potassium Chloride ER 20 MEQ TBCR, Take 20 mEq by mouth in the morning, at noon, in the evening, and at bedtime., Disp: , Rfl:    torsemide (DEMADEX) 20 MG tablet, Take 2 tablets (40 mg total) by mouth 2 (two) times daily., Disp: 120 tablet, Rfl: 0  Radiology:   No results found.  Cardiac Studies:   Exercise sestamibi stress test 08/27/2014: 1. The resting electrocardiogram demonstrated normal sinus rhythm, incomplete LBBB and no resting arrhythmias.  ST segment depression and T-wave inversion in inferior and lateral leads. Stress EKG is negative for ischemia. The patient performed treadmill exercise using a Bruce protocol, completing 6:26 minutes. Thre were occasional PVCs. The patient completed an estimated workload of 7.65 METS, 95% of the maximum predicted heart rate. The stress test was terminated because of dyspnea. 2. The LV is markedly dilated both at rest and stress images. The LV end diastolic volume was 720NO. Thre is anterior wall thinning. There is no evidence of ischemia. Thre is severe generalized hypokinesis and LVEF markedly depressed at 11%.  Findings are consistent with dilated cardiomyopathy.  Clinical correlation is recommended.   Cardiac MRI 11/20/2017: 1. Severely dilated left ventricle with normal wall thickness and severely decreased systolic function (LVEF = 14%). There is severe diffuse hypokinesis and paradoxical septal motion consistent with  LBBB. The ratio of non-compacted to compacted myocardium in the apical and mid portions of the ventricle is >  4:1. There is midwall late gadolinium enhancement in the basal anteroseptal and inferoseptal myocardium. 2. Normal right ventricular size, thickness and systolic function (LVEF = 55%). There are no regional wall motion abnormalities. 3. Severely dilated left atrium (59 mm), normal right atrial size. 4. Moderately dilated pulmonary artery measuring 36 mm. 5. Moderate aortic and mitral regurgitation, mild tricuspid regurgitation. 6. Mild pericardial effusion. Collectively, these findings are consistent with end stage non-compaction cardiomyopathy.  Nocturnal oximetry 05/20/2019: SpO2 <88%:  53 minutes SpO2 <89%:  77 minutes Highest SPO2 99% and lowest SPO2 76%.   Oxygen desaturation events 225, desaturation index 25. Impression: Patient qualifies for nocturnal oxygen supplementation per Medicare guidelines Group 1.  Cardiopulmonary exercise test 11/05/2019: Exercise testing with gas exchange demonstrates normal functional capacity when compared to matched sedentary norms. There is a mild HF limitation with mildly elevated VE/VCO2 slope. There is likely an additional pulmonary limitation component as patient is ventilatory limited at peak exercise and pre-exercise spirometry demonstrates mild restrictive lung physiology.   PCV ECHOCARDIOGRAM COMPLETE 03/30/2020 Severely depressed LV systolic function with visual EF <20%. Left ventricle cavity is severely dilated. Hypokinetic global wall motion. Doppler evidence of grade III (restrictive) diastolic dysfunction, elevated LAP. Calculated EF 31%. Endocardial wires noted within the right cardiac chambers. Left atrial cavity is severely dilated. Mild to moderate aortic regurgitation. Posterior directed jet, wraps the left atrial wall, severe (Grade III) mitral regurgitation. Moderate to severe tricuspid regurgitation. Moderate pulmonary  hypertension. RVSP measures 52 mmHg. IVC is dilated with a respiratory response of >50%. Compared to prior study dated 10/09/2019: G2DD is now G3DD, moderate to severe MR is now severe, mild PHTN 59mHg is now moderate.  Right heart catheterization 12/04/2020: RA = 5 RV = 52/6 PA = 57/22 (36) PCW = 25 (v wave 30) Fick cardiac output/index = 4.0/2.1 PVR = 2.1 WU Ao sat = 99% PA sat = 62%, 64%  EKG   05/08/2021: Underlying sinus with ventricularly paced rhythm, biventricular pacemaker detected.  No further analysis.  Occasional PVCs.   ICD   Remote CRT-D transmission 05/29/2021: AP 19%, CRT 74%.  Longevity 4 years and 5 months.  Lead impedance and thresholds within normal limits.  In persistent atrial fibrillation since 05/07/2021, 100% burden but appears to be decreasing as of 05/20/2021 to 7%.  There was no high ventricular rate episodes.  OptiVol does not suggest fluid overload state.  Assessment:     ICD-10-CM   1. Acute on chronic systolic heart failure (HCC)  I50.23     2. Persistent atrial fibrillation (HCC)  I48.19     3. Nonischemic cardiomyopathy (HCC)  I42.8       CHA2DS2-VASc Score is 3.  Yearly risk of stroke: 3.2% (A, CHF).  Score of 1=0.6; 2=2.2; 3=3.2; 4=4.8; 5=7.2; 6=9.8; 7=>9.8) -(CHF; HTN; vasc disease DM,  Male = 1; Age <65 =0; 65-74 = 1,  >75 =2; stroke/embolism= 2).   No orders of the defined types were placed in this encounter.  There are no discontinued medications.   Recommendations:   Miguel Patterson [place] 76[place]  with severely depressed left-ventricular systolic function, now end stage non-compaction cardiomyopathy, BIV ICD CRT insertion on 02/28/2018.   Presently in Palliative care and on outpatient dobutamine infusion, has been transitioned to hospice care and hospice will be taken over his care in the next few days.  He is feeling the best he has in quite a while, has not  had any  significant dyspnea, further orthopnea.  He still has leg edema but has remained stable and has been wearing support stockings.  Advised the patient to keep his foot elevated, continue to watch his salt intake and at this point discussed he is not a candidate for any kind of mitral valve repair or MitraClip and that palliation would be appropriate.  His son is present at the bedside.  For now, continue present medical care continuing apixaban off patient being transitioned to hospice care.  I would like to see him back in 6 weeks and will probably discontinue some of his cardiac medications at that time.   Adrian Prows, PA-C 06/05/2021, 8:57 PM Office: [phone]          CC: CC: Pierre Bali, MD

## 2021-06-06 ENCOUNTER — Telehealth: Payer: Self-pay

## 2021-06-06 DIAGNOSIS — I480 Paroxysmal atrial fibrillation: Secondary | ICD-10-CM

## 2021-06-07 MED ORDER — AMIODARONE HCL 400 MG PO TABS: 200.0000 mg | ORAL_TABLET | Freq: Two times a day (BID) | ORAL | 1 refills | Status: DC

## 2021-06-07 NOTE — Progress Notes (Addendum)
LATE ENTRY: 06/02/21 Pt in for PICC dsg change by IV team and dobutamine bag change by Carolynn Sayers, RN w/[facility]

## 2021-06-07 NOTE — Telephone Encounter (Signed)
Patient has gone back into atrial fibrillation as per ICD transmission received middle of the night on 06/06/2021, transmission date 06/07/2021 that is today.  Patient has developed acute decompensated heart failure.  I will set him up for repeat cardioversion, will increase amiodarone to 400 mg twice daily for now.    ICD-10-CM   1. Paroxysmal atrial fibrillation (HCC)  I48.0 amiodarone (PACERONE) 400 MG tablet     Medications Discontinued During This Encounter  Medication Reason   amiodarone (PACERONE) 200 MG tablet     Meds ordered this encounter  Medications   amiodarone (PACERONE) 400 MG tablet    Sig: Take 0.5 tablets (200 mg total) by mouth 2 (two) times daily.    Dispense:  60 tablet    Refill:  1

## 2021-06-08 ENCOUNTER — Encounter (HOSPITAL_COMMUNITY): Payer: Self-pay | Admitting: Anesthesiology

## 2021-06-08 ENCOUNTER — Inpatient Hospital Stay (HOSPITAL_COMMUNITY)
Admission: AD | Admit: 2021-06-08 | Discharge: 2021-07-01 | DRG: 291 | Disposition: E | Payer: Medicare Other | Source: Other Acute Inpatient Hospital | Attending: Cardiology | Admitting: Cardiology

## 2021-06-08 ENCOUNTER — Encounter (HOSPITAL_COMMUNITY): Payer: Self-pay | Admitting: Cardiology

## 2021-06-08 ENCOUNTER — Other Ambulatory Visit: Payer: Self-pay | Admitting: Cardiology

## 2021-06-08 ENCOUNTER — Other Ambulatory Visit: Payer: Self-pay

## 2021-06-08 ENCOUNTER — Encounter (HOSPITAL_COMMUNITY): Admission: AD | Disposition: E | Payer: Self-pay | Attending: Cardiology

## 2021-06-08 DIAGNOSIS — Z95810 Presence of automatic (implantable) cardiac defibrillator: Secondary | ICD-10-CM

## 2021-06-08 DIAGNOSIS — Z8249 Family history of ischemic heart disease and other diseases of the circulatory system: Secondary | ICD-10-CM | POA: Diagnosis not present

## 2021-06-08 DIAGNOSIS — R188 Other ascites: Secondary | ICD-10-CM | POA: Diagnosis present

## 2021-06-08 DIAGNOSIS — I5043 Acute on chronic combined systolic (congestive) and diastolic (congestive) heart failure: Principal | ICD-10-CM | POA: Diagnosis present

## 2021-06-08 DIAGNOSIS — Z8349 Family history of other endocrine, nutritional and metabolic diseases: Secondary | ICD-10-CM

## 2021-06-08 DIAGNOSIS — R34 Anuria and oliguria: Secondary | ICD-10-CM | POA: Diagnosis present

## 2021-06-08 DIAGNOSIS — I5023 Acute on chronic systolic (congestive) heart failure: Principal | ICD-10-CM | POA: Diagnosis present

## 2021-06-08 DIAGNOSIS — I420 Dilated cardiomyopathy: Secondary | ICD-10-CM | POA: Diagnosis present

## 2021-06-08 DIAGNOSIS — I080 Rheumatic disorders of both mitral and aortic valves: Secondary | ICD-10-CM | POA: Diagnosis present

## 2021-06-08 DIAGNOSIS — N170 Acute kidney failure with tubular necrosis: Secondary | ICD-10-CM | POA: Diagnosis present

## 2021-06-08 DIAGNOSIS — Z7901 Long term (current) use of anticoagulants: Secondary | ICD-10-CM

## 2021-06-08 DIAGNOSIS — N184 Chronic kidney disease, stage 4 (severe): Secondary | ICD-10-CM | POA: Diagnosis present

## 2021-06-08 DIAGNOSIS — Z79899 Other long term (current) drug therapy: Secondary | ICD-10-CM | POA: Diagnosis not present

## 2021-06-08 DIAGNOSIS — R0602 Shortness of breath: Secondary | ICD-10-CM | POA: Diagnosis present

## 2021-06-08 DIAGNOSIS — Z66 Do not resuscitate: Secondary | ICD-10-CM | POA: Diagnosis present

## 2021-06-08 DIAGNOSIS — I959 Hypotension, unspecified: Secondary | ICD-10-CM | POA: Diagnosis present

## 2021-06-08 DIAGNOSIS — Z515 Encounter for palliative care: Secondary | ICD-10-CM

## 2021-06-08 DIAGNOSIS — I4891 Unspecified atrial fibrillation: Secondary | ICD-10-CM | POA: Diagnosis present

## 2021-06-08 LAB — POCT I-STAT, CHEM 8
BUN: 111 mg/dL — ABNORMAL HIGH (ref 8–23)
Calcium, Ion: 0.97 mmol/L — ABNORMAL LOW (ref 1.15–1.40)
Chloride: 94 mmol/L — ABNORMAL LOW (ref 98–111)
Creatinine, Ser: 2.7 mg/dL — ABNORMAL HIGH (ref 0.61–1.24)
Glucose, Bld: 106 mg/dL — ABNORMAL HIGH (ref 70–99)
HCT: 41 % (ref 39.0–52.0)
Hemoglobin: 13.9 g/dL (ref 13.0–17.0)
Potassium: 5.4 mmol/L — ABNORMAL HIGH (ref 3.5–5.1)
Sodium: 129 mmol/L — ABNORMAL LOW (ref 135–145)
TCO2: 24 mmol/L (ref 22–32)

## 2021-06-08 LAB — BRAIN NATRIURETIC PEPTIDE: B Natriuretic Peptide: 1600.6 pg/mL — ABNORMAL HIGH (ref 0.0–100.0)

## 2021-06-08 SURGERY — CANCELLED PROCEDURE

## 2021-06-08 MED ORDER — MORPHINE SULFATE (PF) 4 MG/ML IV SOLN
4.0000 mg | INTRAVENOUS | Status: DC
  Administered 2021-06-08: 4 mg via INTRAVENOUS
  Filled 2021-06-08: qty 1

## 2021-06-08 MED ORDER — MAGNESIUM OXIDE -MG SUPPLEMENT 400 (240 MG) MG PO TABS
400.0000 mg | ORAL_TABLET | Freq: Two times a day (BID) | ORAL | Status: DC
Start: 2021-06-08 — End: 2021-06-09

## 2021-06-08 MED ORDER — SODIUM CHLORIDE 0.9% FLUSH: 3.0000 mL | INTRAVENOUS | Status: DC | PRN

## 2021-06-08 MED ORDER — AMIODARONE HCL 200 MG PO TABS
200.0000 mg | ORAL_TABLET | Freq: Every day | ORAL | Status: DC
  Filled 2021-06-08: qty 1

## 2021-06-08 MED ORDER — POLYETHYLENE GLYCOL 3350 17 GM/SCOOP PO POWD
17.0000 g | Freq: Two times a day (BID) | ORAL | Status: DC
Start: 2021-06-08 — End: 2021-06-08
  Filled 2021-06-08: qty 255

## 2021-06-08 MED ORDER — CHLORHEXIDINE GLUCONATE CLOTH 2 % EX PADS
6.0000 | MEDICATED_PAD | Freq: Every day | CUTANEOUS | Status: DC
  Administered 2021-06-08: 6 via TOPICAL

## 2021-06-08 MED ORDER — SODIUM CHLORIDE 0.9 % IV SOLN: 250.0000 mL | INTRAVENOUS | Status: DC | PRN

## 2021-06-08 MED ORDER — MAGNESIUM OXIDE 400 MG PO TABS
400.0000 mg | ORAL_TABLET | Freq: Two times a day (BID) | ORAL | Status: DC
  Filled 2021-06-08: qty 1

## 2021-06-08 MED ORDER — METOLAZONE 5 MG PO TABS
5.0000 mg | ORAL_TABLET | Freq: Two times a day (BID) | ORAL | Status: DC
  Filled 2021-06-08: qty 1

## 2021-06-08 MED ORDER — DOBUTAMINE IN D5W 4-5 MG/ML-% IV SOLN
5.0000 ug/kg/min | INTRAVENOUS | Status: DC
Start: 2021-06-08 — End: 2021-06-09
  Administered 2021-06-08: 5 ug/kg/min via INTRAVENOUS
  Filled 2021-06-08: qty 250

## 2021-06-08 MED ORDER — POLYETHYLENE GLYCOL 3350 17 G PO PACK: 17.0000 g | PACK | Freq: Every day | ORAL | Status: DC

## 2021-06-08 MED ORDER — POLYETHYLENE GLYCOL 3350 17 G PO PACK: 17.0000 g | PACK | Freq: Two times a day (BID) | ORAL | Status: DC

## 2021-06-08 MED ORDER — ACETAMINOPHEN 325 MG PO TABS: 650.0000 mg | ORAL_TABLET | ORAL | Status: DC | PRN

## 2021-06-08 MED ORDER — MORPHINE SULFATE (PF) 2 MG/ML IV SOLN
2.0000 mg | Freq: Four times a day (QID) | INTRAVENOUS | Status: DC
  Administered 2021-06-08: 2 mg via INTRAVENOUS
  Filled 2021-06-08: qty 1

## 2021-06-08 MED ORDER — ONDANSETRON HCL 4 MG/2ML IJ SOLN
4.0000 mg | Freq: Four times a day (QID) | INTRAMUSCULAR | Status: DC | PRN
  Administered 2021-06-08: 4 mg via INTRAVENOUS
  Filled 2021-06-08: qty 2

## 2021-06-08 MED ORDER — MORPHINE 100MG IN NS 100ML (1MG/ML) PREMIX INFUSION
4.0000 mg/h | INTRAVENOUS | Status: DC
  Filled 2021-06-08 (×2): qty 100

## 2021-06-08 MED ORDER — FUROSEMIDE 10 MG/ML IJ SOLN
INTRAMUSCULAR | Status: AC
  Filled 2021-06-08: qty 8

## 2021-06-08 MED ORDER — APIXABAN 5 MG PO TABS
5.0000 mg | ORAL_TABLET | Freq: Two times a day (BID) | ORAL | Status: DC
Start: 2021-06-08 — End: 2021-06-08

## 2021-06-08 MED ORDER — SODIUM CHLORIDE 0.9% FLUSH
3.0000 mL | Freq: Two times a day (BID) | INTRAVENOUS | Status: DC
  Administered 2021-06-08: 3 mL via INTRAVENOUS

## 2021-06-08 MED ORDER — FUROSEMIDE 10 MG/ML IJ SOLN: 80.0000 mg | INTRAMUSCULAR | Status: DC

## 2021-06-08 MED ORDER — SODIUM CHLORIDE 0.9% FLUSH
10.0000 mL | Freq: Two times a day (BID) | INTRAVENOUS | Status: DC
  Administered 2021-06-08: 20 mL

## 2021-06-08 MED ORDER — FUROSEMIDE 10 MG/ML IJ SOLN
60.0000 mg | Freq: Once | INTRAMUSCULAR | Status: AC
  Administered 2021-06-08: 60 mg via INTRAVENOUS

## 2021-06-08 NOTE — H&P (Signed)
CARDIOLOGY ADMIT NOTE   Patient ID: Miguel Patterson MRN: [id number] DOB/AGE: April 16, 1945 76 y.o.  Admit date: 06/22/2021 Primary Physician:  Nickola Major, MD  Patient ID: Miguel Patterson, male    DOB: January 29, 1945, 76 y.o.   MRN: [id number]  No chief complaint on file.  HPI:    Miguel Patterson  is a 76 y.o. Caucasian male patient who has end-stage noncompaction cardiomyopathy with severe LV systolic dysfunction, patient was in palliative care and transition him to hospice, unfortunately palliative care stopped home care about a week ago and he was supposed to start hospice in about 3 to 4 days from now.  He called our office stating his breathing has been difficult, abdominal swelling and discomfort, not feeling well.  I also received ICD alert on 06/06/2021 stating he is back in atrial fibrillation.  I arranged for urgent cardioversion this morning and brought him in for direct-current cardioversion, he was in sinus/AV paced/AV-BV paced rhythm, procedure was canceled.  He was in mild respiratory distress, also looked very uncomfortable, markedly elevated JVD, ascites and hepatomegaly.  I decided to admit the patient to the hospital.  Past Medical History:  Diagnosis Date   AICD (automatic cardioverter/defibrillator) present    Cardiomegaly    CHF (congestive heart failure) (HCC)    Chronic combined systolic and diastolic heart failure ([place]) 02/10/2018   DCM (dilated cardiomyopathy) ([place])    Encounter for adjustment of biventricular implantable cardioverter-defibrillator (ICD) 04/03/2019   Fatigue    ICD- Biventricular Medtronic ICD Claria Mri [id number] in situ 02/28/18 03/01/2018   LBBB (left bundle branch block)    Mild tricuspid regurgitation    Moderate aortic regurgitation    Moderate mitral regurgitation    Pericardial effusion    INSIGNIFICANT   S/P biventricular cardiac pacemaker procedure 02/28/18 with ICD MDT  03/01/2018   Short of breath on exertion    Sleep  apnea    Systolic heart failure (HCC)    Trace pulmonic regurgitation by prior echocardiogram    Past Surgical History:  Procedure Laterality Date   BIV ICD INSERTION CRT-D N/A 02/28/2018   Procedure: BIV ICD INSERTION CRT-D;  Surgeon: Deboraha Sprang, MD;  Location: [facility];  Service: Cardiovascular;  Laterality: N/A;   CARDIOVERSION N/A 04/11/2021   Procedure: CARDIOVERSION;  Surgeon: Adrian Prows, MD;  Location: [facility] ENDOSCOPY;  Service: Cardiovascular;  Laterality: N/A;   CARDIOVERSION N/A 04/25/2021   Procedure: CARDIOVERSION;  Surgeon: Adrian Prows, MD;  Location: [facility] ENDOSCOPY;  Service: Cardiovascular;  Laterality: N/A;   CARDIOVERSION N/A 05/22/2021   Procedure: CARDIOVERSION;  Surgeon: Larey Dresser, MD;  Location: [facility] ENDOSCOPY;  Service: Cardiovascular;  Laterality: N/A;   LEG SURGERY Left 2000   DUE TO BROKE LEG   RIGHT HEART CATH N/A 12/14/2020   Procedure: RIGHT HEART CATH;  Surgeon: Jolaine Artist, MD;  Location: [facility];  Service: Cardiovascular;  Laterality: N/A;   Social History   Socioeconomic History   Marital status: Divorced    Spouse name: Not on file   Number of children: 3   Years of education: 91   Highest education level: Professional school degree (e.g., MD, DDS, DVM, JD)  Occupational History   Occupation: Retired    Comment: Retired college professor [employer]  Tobacco Use   Smoking status: Never   Smokeless tobacco: Never  Vaping Use   Vaping Use: Never used  Substance and Sexual Activity   Alcohol use: No   Drug  use: No   Sexual activity: Not Currently  Other Topics Concern   Not on file  Social History Narrative   Not on file   Social Determinants of Health   Financial Resource Strain: Not on file  Food Insecurity: Not on file  Transportation Needs: Not on file  Physical Activity: Not on file  Stress: Not on file  Social Connections: Not on file  Intimate Partner Violence: Not on file   Family History  Problem  Relation Age of Onset   Hypertension Mother 36   Thyroid disease Mother    Healthy Father 41    ROS  Review of Systems  Constitutional: Positive for malaise/fatigue.  Cardiovascular:  Positive for dyspnea on exertion.  Gastrointestinal:  Positive for bloating.   Objective      06/24/2021    6:30 PM 06/29/2021    5:30 PM 06/07/2021    2:28 PM  Vitals with BMI  Height   5\' 10"   Weight   162 lbs 1 oz  BMI   74.94  Systolic 79 76 95  Diastolic 65 66 63  Pulse  76 76    Physical Exam Vitals reviewed.  Constitutional:      Appearance: He is well-developed.  Neck:     Vascular: JVD present. No carotid bruit.  Cardiovascular:     Rate and Rhythm: Normal rate and regular rhythm.     Pulses: Normal pulses and intact distal pulses.     Heart sounds: S1 normal and S2 normal. Murmur heard.     High-pitched blowing holosystolic murmur is present with a grade of 3/6 at the apex.     No gallop.  Pulmonary:     Effort: Pulmonary effort is normal. No accessory muscle usage.     Breath sounds: Rales (bases) present.  Abdominal:     General: Abdomen is protuberant. Bowel sounds are normal.     Palpations: Abdomen is soft. There is fluid wave and hepatomegaly.  Musculoskeletal:     Right lower leg: No edema.     Left lower leg: No edema.    Laboratory examination:   Recent Labs    04/27/21 0620 04/28/21 0556 05/19/21 1117 06/24/2021 1431  NA 133* 131* 130* 129*  K 3.5 3.4* 4.5 5.4*  CL 93* 92* 91* 94*  CO2 28 27 21*  --   GLUCOSE 113* 124* 101* 106*  BUN 82* 90* 120* 111*  CREATININE 1.97* 2.26* 2.83* 2.70*  CALCIUM 8.7* 8.4* 9.1  --   GFRNONAA 35* 30* 23*  --    estimated creatinine clearance is 24.4 mL/min (A) (by C-G formula based on SCr of 2.7 mg/dL (H)).     Latest Ref Rng & Units 06/14/2021    2:31 PM 05/19/2021   11:17 AM 04/28/2021    5:56 AM  CMP  Glucose 70 - 99 mg/dL 106  101  124   BUN 8 - 23 mg/dL 111  120  90   Creatinine 0.61 - 1.24 mg/dL 2.70  2.83  2.26    Sodium 135 - 145 mmol/L 129  130  131   Potassium 3.5 - 5.1 mmol/L 5.4  4.5  3.4   Chloride 98 - 111 mmol/L 94  91  92   CO2 22 - 32 mmol/L  21  27   Calcium 8.9 - 10.3 mg/dL  9.1  8.4       Latest Ref Rng & Units 06/10/2021    2:31 PM 04/25/2021  3:27 AM 04/24/2021    7:36 AM  CBC  WBC 4.0 - 10.5 K/uL  8.7  9.6   Hemoglobin 13.0 - 17.0 g/dL 13.9  13.8  14.0   Hematocrit 39.0 - 52.0 % 41.0  40.8  42.8   Platelets 150 - 400 K/uL  183  176    Lipid Panel     Component Value Date/Time   CHOL 179 03/20/2019 0804   TRIG 61 03/20/2019 0804   HDL 51 03/20/2019 0804   LDLCALC 116 (H) 03/20/2019 0804   HEMOGLOBIN A1C No results found for: "HGBA1C", "MPG" TSH Recent Labs    12/06/20 1142  TSH 4.190   BNP (last 3 results) Recent Labs    04/20/21 1155 04/24/21 0517 06/30/2021 1727  BNP 1,925.8* 1,094.6* 1,600.6*   Cardiac Panel (last 3 results) No results for input(s): "CKTOTAL", "CKMB", "TROPONINIHS", "RELINDX" in the last 72 hours.   Medications and allergies   Allergies  Allergen Reactions   Losartan Potassium Anaphylaxis    angioedema   Aspirin Other (See Comments)    Stomach pain      Prior to Admission medications   Medication Sig Start Date End Date Taking? Authorizing Provider  amiodarone (PACERONE) 400 MG tablet Take 0.5 tablets (200 mg total) by mouth 2 (two) times daily. 06/07/21  Yes Adrian Prows, MD  apixaban (ELIQUIS) 5 MG TABS tablet Take 1 tablet (5 mg total) by mouth 2 (two) times daily. 04/28/21  Yes Sanjuan Dame, MD  DOBUTamine (DOBUTREX) 4-5 MG/ML-% infusion Inject 355 mcg/min into the vein continuous. 05/17/21 11/13/21 Yes Cantwell, Celeste C, PA-C  magnesium oxide (MAG-OX) 400 MG tablet Take 1 tablet (400 mg total) by mouth 2 (two) times daily. 04/28/21  Yes Sanjuan Dame, MD  metolazone (ZAROXOLYN) 2.5 MG tablet Take 2.5 mg by mouth daily.   Yes [provider]  polyethylene glycol powder (GLYCOLAX/MIRALAX) 17 GM/SCOOP powder Take 17 g  by mouth 2 (two) times daily. 04/28/21  Yes Sanjuan Dame, MD  Potassium Chloride ER 20 MEQ TBCR Take 20 mEq by mouth in the morning, at noon, in the evening, and at bedtime.   Yes [provider]  torsemide (DEMADEX) 20 MG tablet Take 2 tablets (40 mg total) by mouth 2 (two) times daily. 04/28/21  Yes Sanjuan Dame, MD     sodium chloride     DOBUTamine 5 mcg/kg/min (06/16/2021 1845)    Current Outpatient Medications  Medication Instructions   amiodarone (PACERONE) 200 mg, Oral, 2 times daily   DOBUTamine (DOBUTREX) 4-5 MG/ML-% infusion 5 mcg/kg/min, Intravenous, Continuous   Eliquis 5 mg, Oral, 2 times daily   magnesium oxide (MAG-OX) 400 mg, Oral, 2 times daily   metolazone (ZAROXOLYN) 2.5 mg, Oral, Daily   polyethylene glycol powder (GLYCOLAX/MIRALAX) 17 g, Oral, 2 times daily   Potassium Chloride ER 20 MEQ TBCR 20 mEq, Oral, 4 times daily   torsemide (DEMADEX) 40 mg, Oral, 2 times daily    No intake/output data recorded. No intake/output data recorded.   Cardiac Studies:   EKG 05/22/2021: Atrially paced and biventricular paced rhythm.  CareLink express transmission on BiV ICD 06/06/2021: 6 hours of atrial fibrillation on 06/06/2021.  Now AV paced rhythm with occasional PACs.  Echocardiogram 04/22/2021: 1. Left ventricular ejection fraction, by estimation, is <20%. The left  ventricle has severely decreased function. The left ventricle demonstrates  global hypokinesis. The left ventricular internal cavity size was dilated  (LVIDd 7.4cm). Left ventricular  diastolic parameters are consistent with Grade III  diastolic dysfunction  (restrictive). Elevated left atrial pressure. The E/e' is 19.3.   2. Right ventricular systolic function is mildly reduced. The right  ventricular size is normal. There is moderately elevated pulmonary artery  systolic pressure. The estimated right ventricular systolic pressure is  12.2 mmHg.  Assessment   1.  Acute on chronic systolic  heart failure 2.  End-stage cardiomyopathy due to noncompaction 3.  Multiorgan failure  Recommendations:  Patient was initially admitted with hopes of improving his urine output with IV diuretics.  However I have been following him throughout the day, patient has been hypotensive, hypoxemic, has accepted death graciously.  His son [name] is also present at the bedside.  Extensive discussion over the telephone with his son regarding the fact that he is severely hypotensive, he is anuric in spite of diuretic therapy, goals of therapy would be comfort measures only at this point.  I have discontinued all his medications except dobutamine and diuretics and as per his son, to keep the patient comfortable and in no distress, to relieve his anxiety, to sedate him through the night with morphine sulfate.  I have discontinued all his labs.  Continue oxygen therapy for now from patient's comfort standpoint and reassurance, continue dobutamine for now for the same reason.  Adrian Prows, MD, [facility] 06/10/2021, 7:14 PM [facility]. [place] Pager: [phone] Office: [phone]

## 2021-06-08 NOTE — Progress Notes (Signed)
Heart Failure Navigator Progress Note  Assessed for Heart & Vascular TOC clinic readiness.  Patient does not meet criteria due to [facility] cardiology patient.     Tarisa Paola, BSN, RN Heart Failure Nurse Navigator Secure Chat Only   

## 2021-06-08 NOTE — Anesthesia Preprocedure Evaluation (Signed)
Anesthesia Evaluation    Reviewed: Allergy & Precautions, Patient's Chart, lab work & pertinent test results  History of Anesthesia Complications Negative for: history of anesthetic complications  Airway        Dental   Pulmonary shortness of breath, sleep apnea ,     + decreased breath sounds      Cardiovascular Exercise Tolerance: Poor +CHF  + dysrhythmias Atrial Fibrillation + Cardiac Defibrillator + Valvular Problems/Murmurs   Ventricular-paced rhythm with occasional AV dual-paced complexes Abnormal ECG Confirmed by Lauree Chandler [phone]) on 05/19/2021 12:46:01 PM   1. Left ventricular ejection fraction, by estimation, is <20%. The left  ventricle has severely decreased function. The left ventricle demonstrates  global hypokinesis. The left ventricular internal cavity size was dilated  (LVIDd 7.4cm). Left ventricular  diastolic parameters are consistent with Grade III diastolic dysfunction  (restrictive). Elevated left atrial pressure. The E/e' is 19.3.  2. Right ventricular systolic function is mildly reduced. The right  ventricular size is normal. There is moderately elevated pulmonary artery  systolic pressure. The estimated right ventricular systolic pressure is  67.5 mmHg.  3. Left atrial size was severely dilated.  4. Right atrial size was mild to moderately dilated.  5. Moderate pericardial effusion. The pericardial effusion is  circumferential, mostly posterior.  6. The mitral valve is grossly normal. Severe mitral valve regurgitation.  No evidence of mitral stenosis.  7. Tricuspid valve regurgitation is moderate to severe.  8. The aortic valve is tricuspid. Aortic valve regurgitation is mild.  Aortic valve sclerosis is present, with no evidence of aortic valve    Neuro/Psych negative neurological ROS  negative psych ROS   GI/Hepatic negative GI ROS, Neg liver ROS,   Endo/Other  negative  endocrine ROS  Renal/GU ARF and CRFRenal diseasehyponatremia     Musculoskeletal negative musculoskeletal ROS (+)   Abdominal   Peds negative pediatric ROS (+)  Hematology  (+) Blood dyscrasia, ,   Anesthesia Other Findings On dobutamine gtt at home via PICC  Reproductive/Obstetrics                             Anesthesia Physical  Anesthesia Plan  ASA: 4  Anesthesia Plan: General   Post-op Pain Management: Minimal or no pain anticipated   Induction: Intravenous  PONV Risk Score and Plan: 2 and Treatment may vary due to age or medical condition  Airway Management Planned: Mask  Additional Equipment: None  Intra-op Plan:   Post-operative Plan:   Informed Consent:   Patient has DNR.     Plan Discussed with: Anesthesiologist  Anesthesia Plan Comments: (On palliative dobutamine per cardiology notes.  EF less than 20%)        Anesthesia Quick Evaluation

## 2021-06-08 NOTE — Progress Notes (Signed)
Pt in pre-procedure for cardioversion with a medtronic pacemaker/ICD.  Medtronic transmission sent showing AV paced rhythm, no afib since 5/26.  Dr. Einar Gip notified, came to bedside.  Pt to be admitted for heart failure.    Vista Lawman, RN

## 2021-06-08 NOTE — Progress Notes (Signed)
Patient arrived to room 3E18. Vital signs obtained. Patient having n/v. Notified Dr. Nadyne Coombes. New orders received. Patient alert and oriented. Call bell within reach.   06/23/2021 1730  Vitals  Temp (!) 97.3 F (36.3 C)  Temp Source Rectal  BP (!) 76/66  MAP (mmHg) 71  BP Location Left Arm  BP Method Automatic  Patient Position (if appropriate) Lying  Pulse Rate 76  Pulse Rate Source Monitor  ECG Heart Rate 78  Resp (!) 24  Level of Consciousness  Level of Consciousness Alert  MEWS COLOR  MEWS Score Color Yellow  Oxygen Therapy  SpO2 95 %  O2 Device Nasal Cannula  O2 Flow Rate (L/min) 4 L/min  Pain Assessment  Pain Scale 0-10  MEWS Score  MEWS Temp 0  MEWS Systolic 2  MEWS Pulse 0  MEWS RR 1  MEWS LOC 0  MEWS Score 3

## 2021-06-08 NOTE — Progress Notes (Addendum)
Pt admitted with with home pump and dobutamine infusing. Orders from MD- Called pharmacy to verify that med will be coming soon. Floor RN aware that new bag will need to be hung with new tubing dose as per [facility] and MD order. Added orders to care for PICC line. Dressing change due tomorrow. Son wants to take home infusion pump home after D/C from patient.

## 2021-06-09 ENCOUNTER — Encounter (HOSPITAL_COMMUNITY): Payer: Medicare Other

## 2021-07-01 NOTE — Discharge Summary (Signed)
Physician Discharge Summary  Patient ID: Miguel Patterson MRN: [id number] DOB/AGE: Mar 12, 1945 76 y.o. Miguel Major, MD   Admit date: 06/15/2021 Discharge pronounced dead: June 16, 2021 at 0113 hours.   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  Hospital Course: Miguel Patterson is a 76 y.o. 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  Patient initially was brought in for direct-current cardioversion as he had done well maintaining sinus rhythm.  Upon presentation to the short stay and to the endoscopy suite, he was found to be florid pulmonary edema, markedly dyspneic, hence decision was made to admit him to the hospital and transition him to inpatient hospice care/palliative care.  After he did not respond to usual and aggressive diuretic therapy, I had discontinued most of his cardiac medications and started him on morphine sulfate.  His family and the patient himself were continuously updated, patient was aware that he has reached end of his life.  Patient passed away peacefully on 06/16/21 at 01: 13 hours.  Patient's son was present at the bedside.   Please see my history and physical for complete details of the patient's history.   Adrian Prows, MD, [facility] 06/20/2021, 8:57 AM Office: [phone]

## 2021-07-01 NOTE — Progress Notes (Signed)
2 RNs pronounced death at 46. Son at bedside.

## 2021-07-01 NOTE — Progress Notes (Signed)
[facility] referral number [id number], per Sharol Given. Possible donor for eyes and tissue.

## 2021-07-01 NOTE — Progress Notes (Signed)
Spoke with Dr. Einar Gip about patients status (changing rhythm/widened QRS), patient having a harder time breathing.  Verbal orders for RN to pronounce and an order for a morphine drip.  Son also called to notify of patients condition and he is coming up to stay with patient.

## 2021-07-01 NOTE — Plan of Care (Signed)
  Problem: Clinical Measurements: Goal: Ability to maintain clinical measurements within normal limits will improve Outcome: Not Progressing Goal: Respiratory complications will improve Outcome: Not Progressing   

## 2021-07-01 DEATH — deceased

## 2021-07-19 ENCOUNTER — Encounter (HOSPITAL_COMMUNITY): Payer: Medicare Other | Admitting: Internal Medicine

## 2021-10-05 ENCOUNTER — Ambulatory Visit: Payer: Medicare Other | Admitting: Neurology

## 2022-08-15 NOTE — Telephone Encounter (Signed)
done

## 2023-04-11 IMAGING — DX DG CHEST 1V PORT
1 series · 1 of 1 positions shown · non-contrast
Comparison: Previous studies including the examination of
12/06/2020

CLINICAL DATA: Shortness of breath

EXAM:
PORTABLE CHEST 1 VIEW

[chest ap]
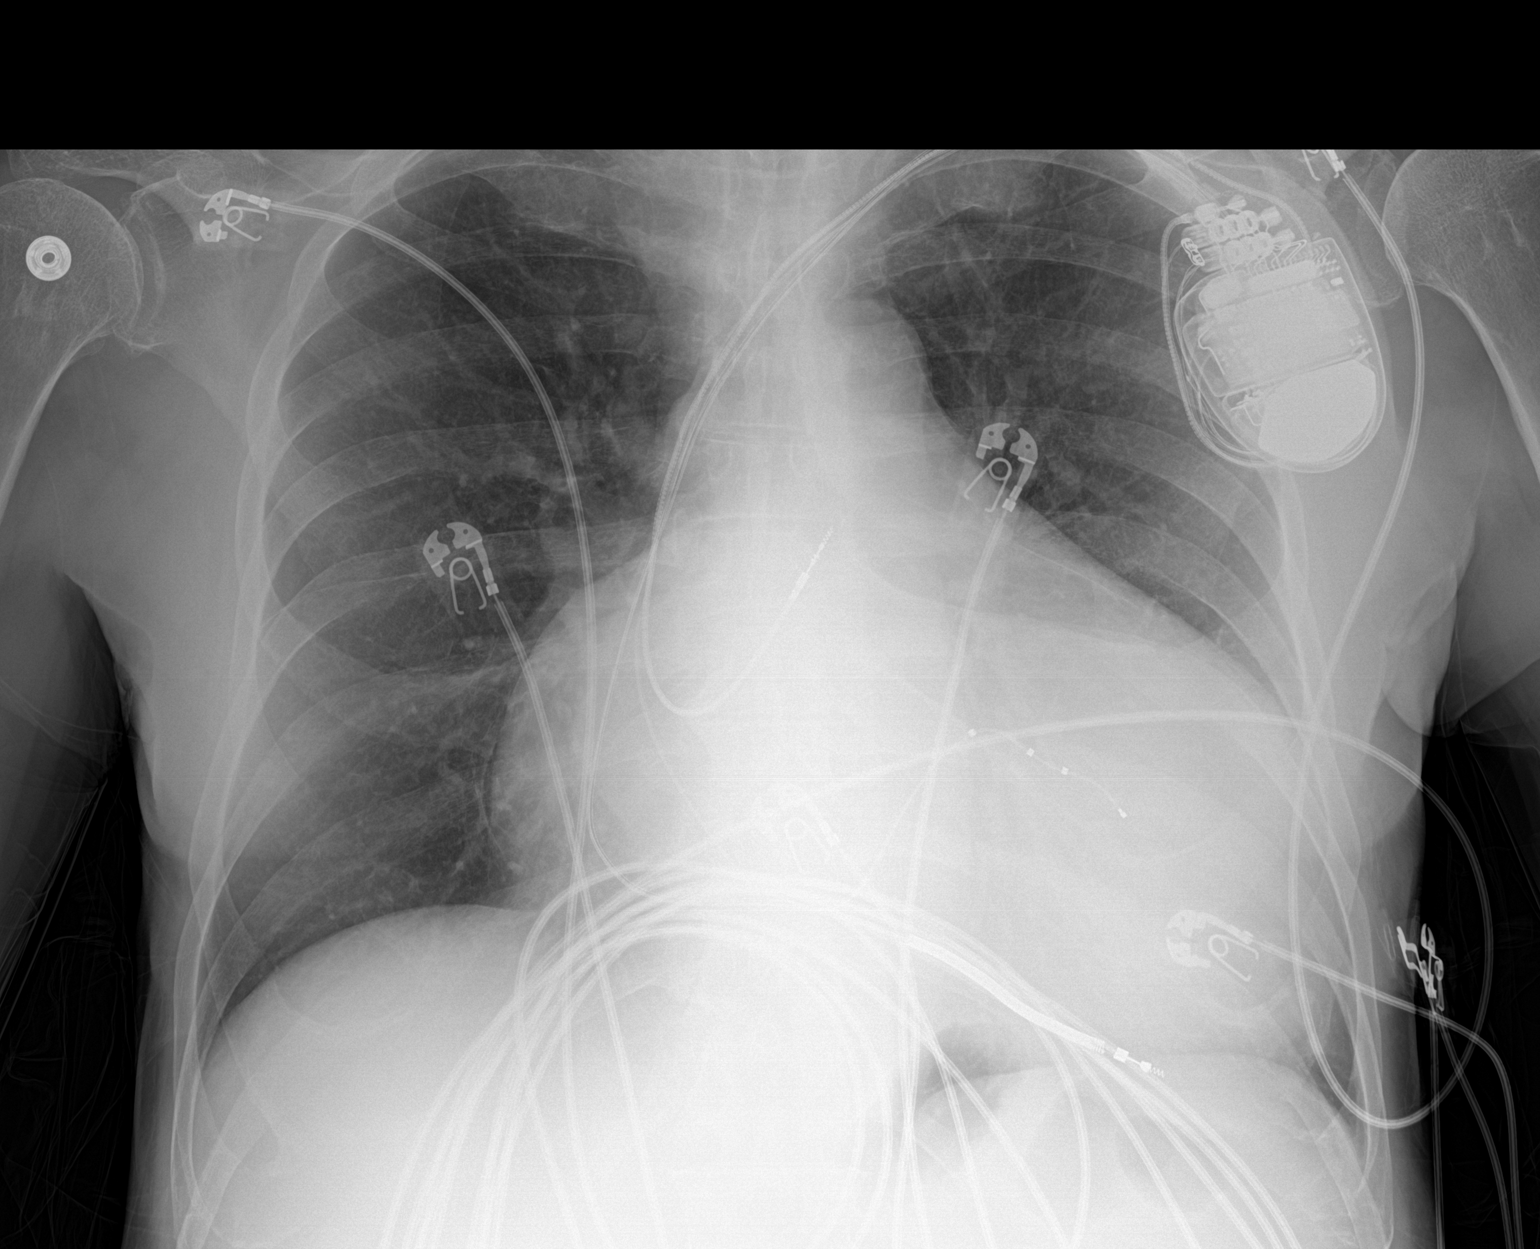

[1 of 1 positions shown; findings below may reference images not displayed]

FINDINGS: Transverse diameter of heart is markedly increased.
Pacemaker/defibrillator battery is seen in the left infraclavicular
region. Biventricular pacer leads are noted in place. There are no
signs of pulmonary edema or focal pulmonary consolidation. There is
no pleural effusion or pneumothorax.
IMPRESSION: Cardiomegaly. There are no signs of pulmonary edema or focal
pulmonary consolidation.

## 2023-04-12 IMAGING — CR DG CHEST 1V
1 series · 1 of 1 positions shown · non-contrast
Comparison: 04/20/2021
COMPARISON: 04/20/2021

Addendum:
CLINICAL DATA: Shortness of breath

EXAM:
CHEST  1 VIEW

[chest ap]
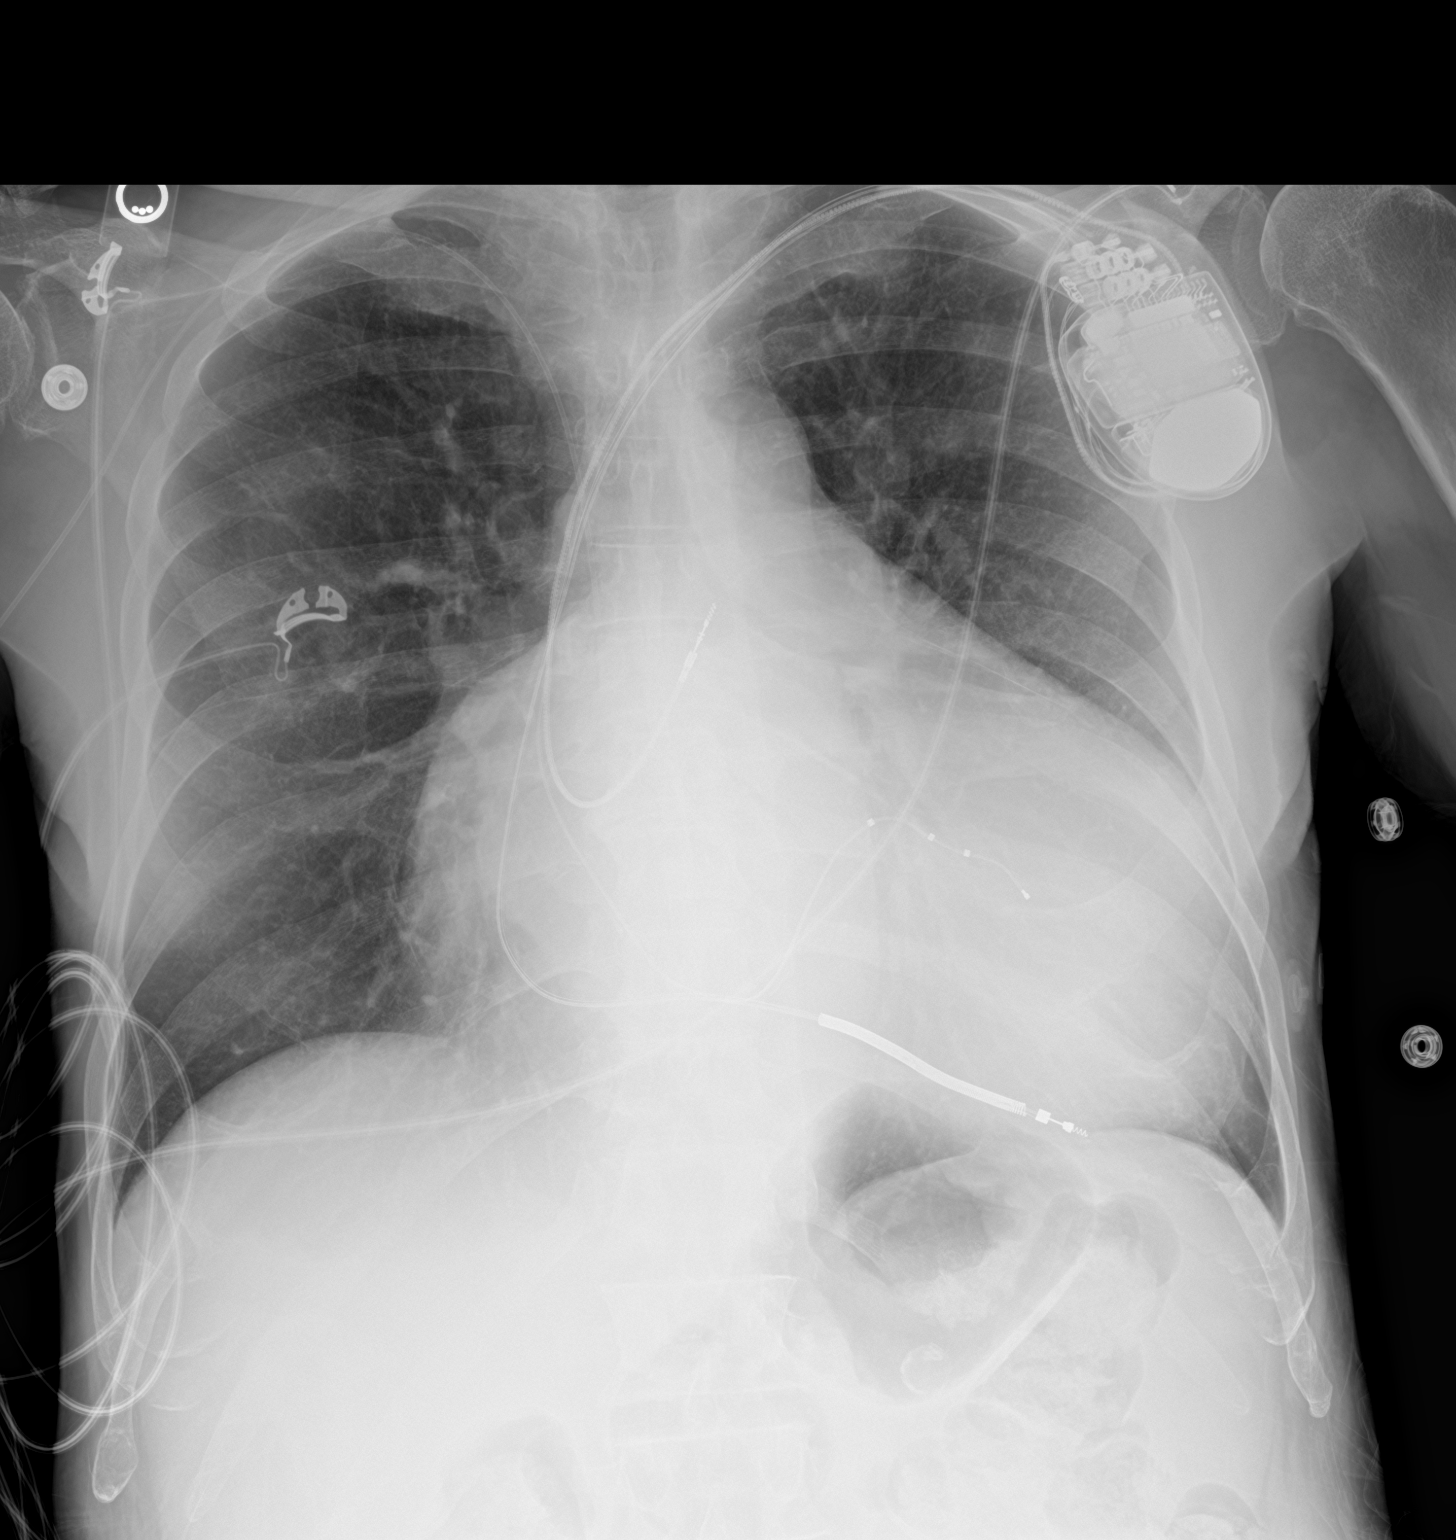

[1 of 1 positions shown; findings below may reference images not displayed]

FINDINGS: Transverse diameter of heart is increased. There are no signs of
pulmonary edema or focal pulmonary consolidation. There is no
pleural effusion or pneumothorax. Pacemaker/defibrillator battery is
seen in the left infraclavicular region. Biventricular pacer leads
are noted in place.
IMPRESSION: Cardiomegaly. There are no signs of pulmonary edema or new focal
infiltrates.

ADDENDUM:
Tip of right upper extremity PICC line is noted in the region of
superior vena cava.

*** End of Addendum ***
FINDINGS: Transverse diameter of heart is increased. There are no signs of
pulmonary edema or focal pulmonary consolidation. There is no
pleural effusion or pneumothorax. Pacemaker/defibrillator battery is
seen in the left infraclavicular region. Biventricular pacer leads
are noted in place.
IMPRESSION: Cardiomegaly. There are no signs of pulmonary edema or new focal
infiltrates.

## 2023-04-12 IMAGING — US US RENAL
1 series · 14 of 25 positions shown · non-contrast
Comparison: April 27, 2019

CLINICAL DATA: Acute kidney injury.

EXAM:
RENAL / URINARY TRACT ULTRASOUND COMPLETE

[Series 1: us renal · 14 of 81 slices shown]
[im 1/81]
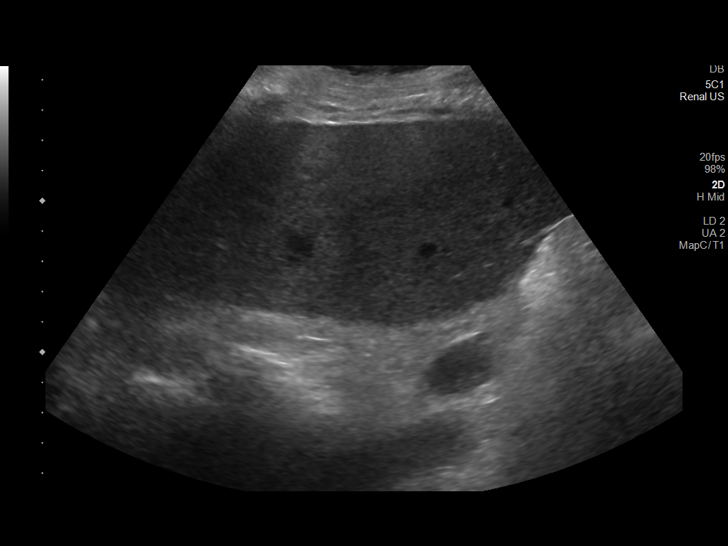
[im 7/81]
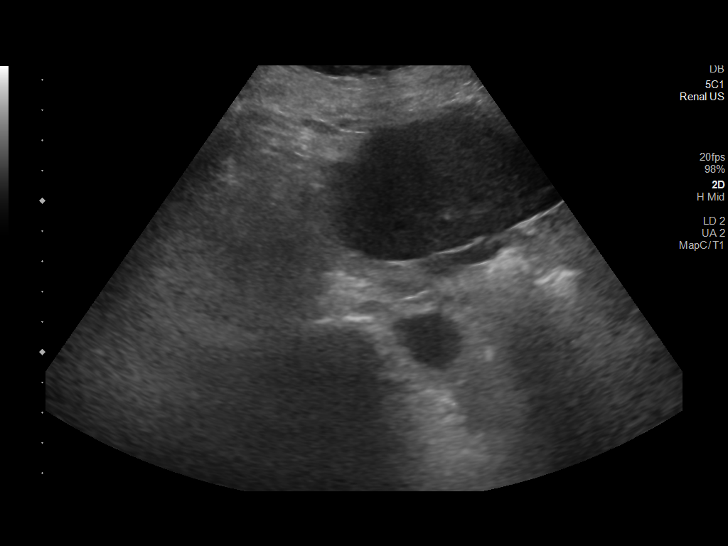
[im 14/81]
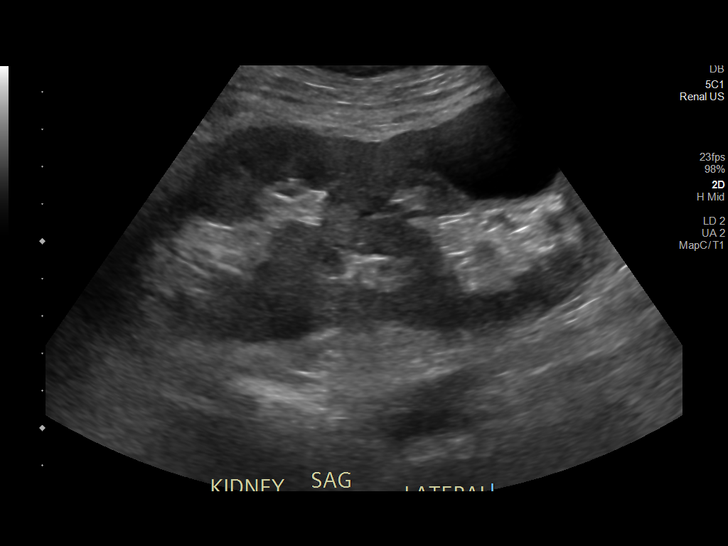
[im 21/81]
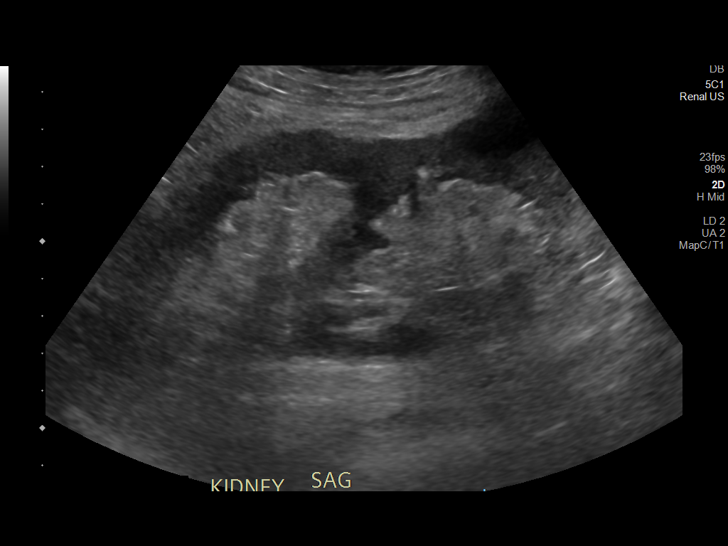
[im 27/81]
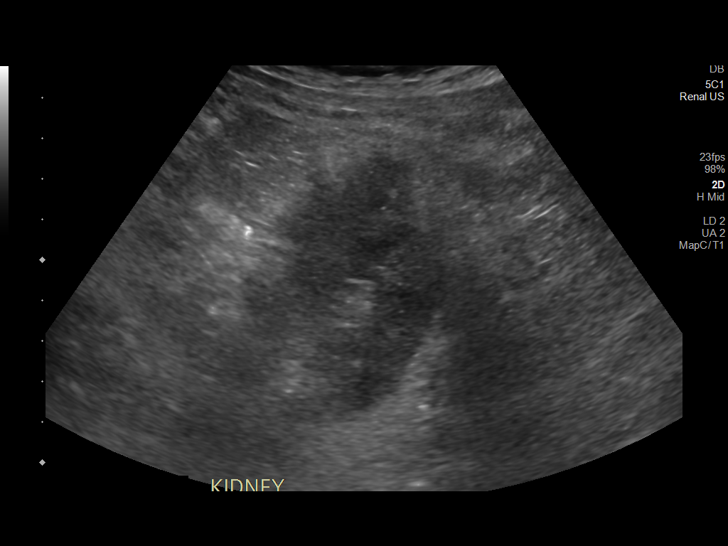
[im 31/81]
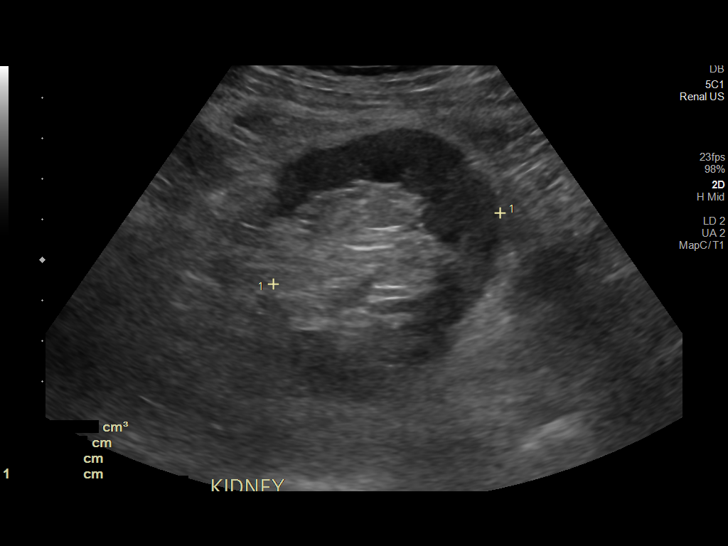
[im 37/81]
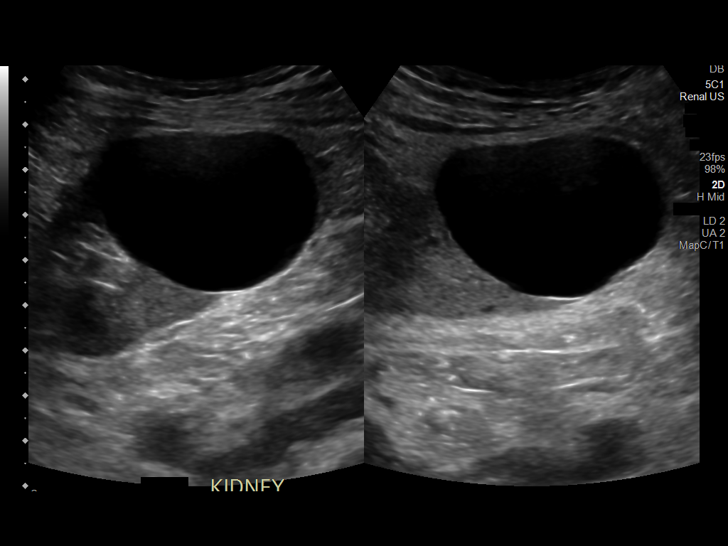
[im 44/81]
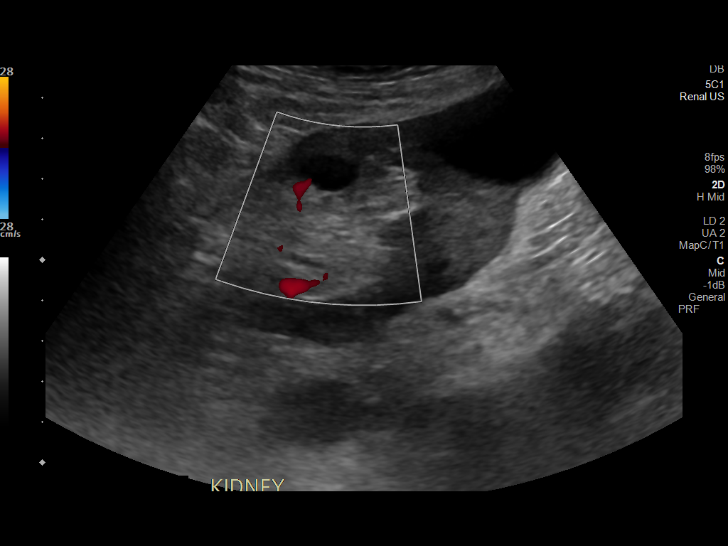
[im 51/81]
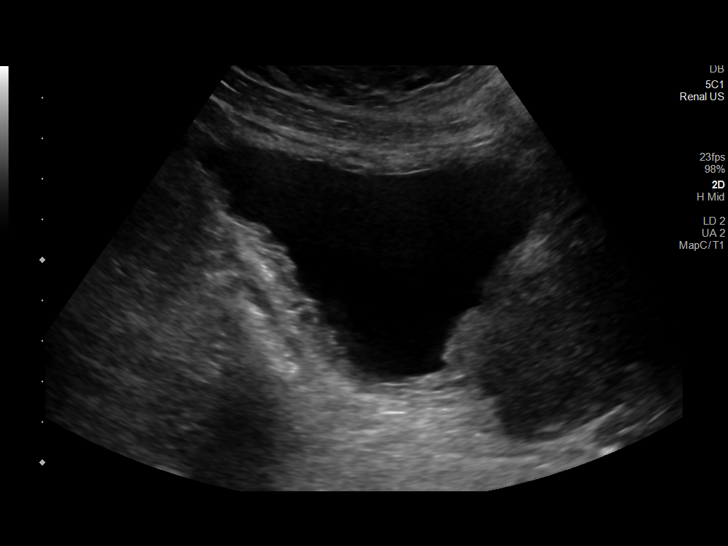
[im 54/81]
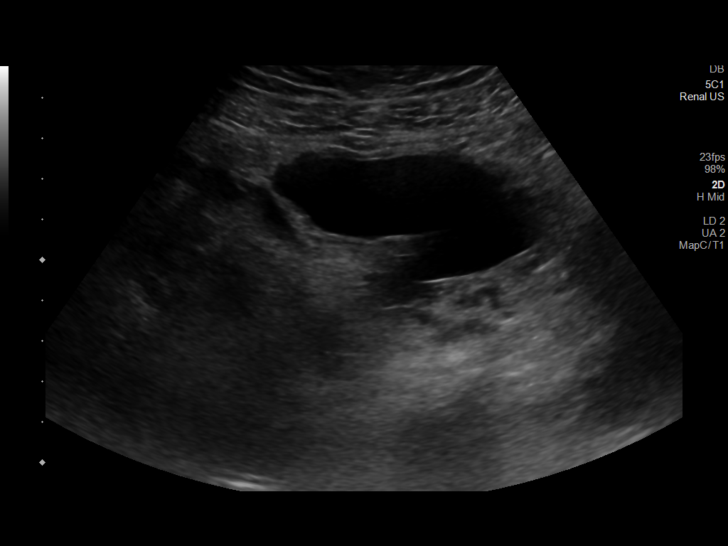
[im 61/81]
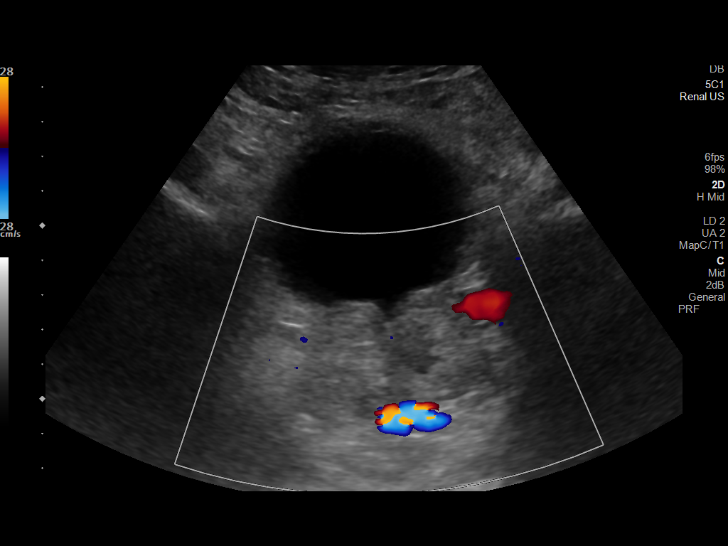
[im 67/81]
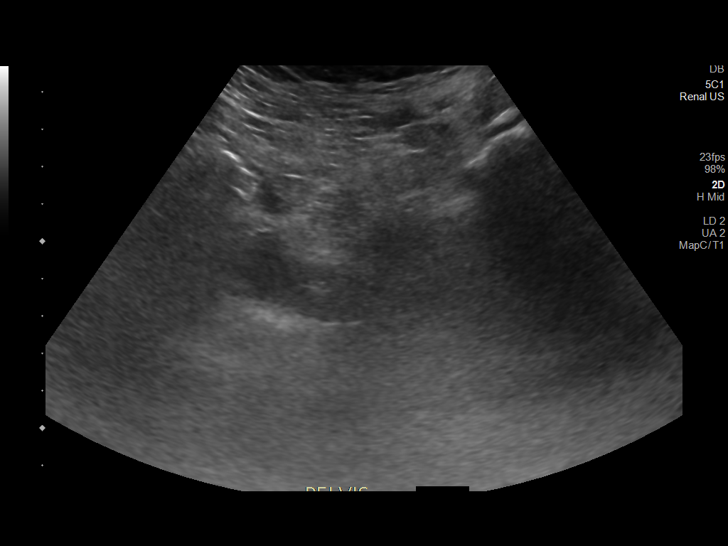
[im 74/81]
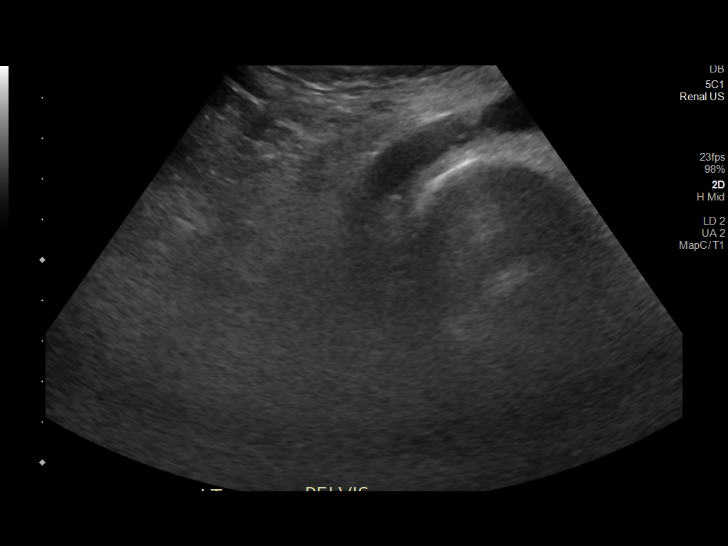
[im 81/81]
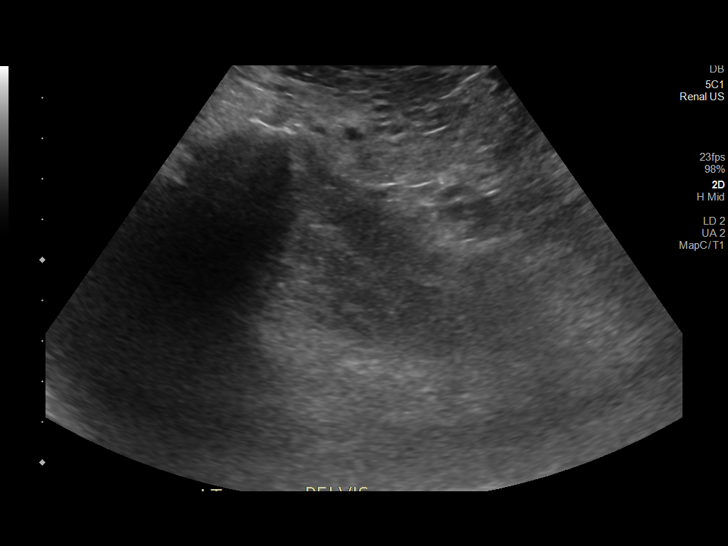

[14 of 25 positions shown; findings below may reference images not displayed]

FINDINGS: Right Kidney:

Right kidney is not clearly identified.

Left Kidney:

Renal measurements: 14.5 cm x 6.2 cm x 5.9 cm = volume: 278.5 mL.
Echogenicity within normal limits. 5.2 cm x 3.7 cm x 5.2 cm and
cm x 2.1 cm x 2.2 cm anechoic structures are seen within the mid and
lower left kidney. No abnormal flow is noted within this region on
color Doppler evaluation. No hydronephrosis is visualized.

Bladder:

Appears normal for degree of bladder distention.

Other:

The prostate gland is enlarged and measures 5.8 cm x 3.8 cm x 6.1 cm
(70.9 mL).

A mild amount of fluid is seen within the right upper quadrant.
IMPRESSION: 1. Nonvisualization of the right kidney which may be congenital in
nature. Correlation with the patient's history is recommended.
2. Simple left renal cysts.
3. Prostatomegaly.
4. Right upper quadrant free fluid.
